# Patient Record
Sex: Male | Born: 1980 | Race: Black or African American | Hispanic: No | Marital: Single | Smoking: Current every day smoker
Health system: Southern US, Community
[De-identification: ages and names within clinical notes are randomized; demographics above are authoritative.]

## PROBLEM LIST (undated history)

## (undated) ENCOUNTER — Emergency Department (HOSPITAL_COMMUNITY): Admission: EM

## (undated) ENCOUNTER — Emergency Department (HOSPITAL_COMMUNITY): Admission: EM | Payer: Medicare (Managed Care) | Source: Home / Self Care

## (undated) ENCOUNTER — Emergency Department (HOSPITAL_COMMUNITY): Admission: EM | Payer: Medicare (Managed Care)

## (undated) ENCOUNTER — Emergency Department (HOSPITAL_BASED_OUTPATIENT_CLINIC_OR_DEPARTMENT_OTHER): Payer: Medicare (Managed Care) | Source: Home / Self Care

## (undated) DIAGNOSIS — E119 Type 2 diabetes mellitus without complications: Secondary | ICD-10-CM

## (undated) DIAGNOSIS — I10 Essential (primary) hypertension: Secondary | ICD-10-CM

## (undated) DIAGNOSIS — F250 Schizoaffective disorder, bipolar type: Secondary | ICD-10-CM

## (undated) DIAGNOSIS — S069X9A Unspecified intracranial injury with loss of consciousness of unspecified duration, initial encounter: Secondary | ICD-10-CM

## (undated) DIAGNOSIS — K219 Gastro-esophageal reflux disease without esophagitis: Secondary | ICD-10-CM

## (undated) DIAGNOSIS — S069XAA Unspecified intracranial injury with loss of consciousness status unknown, initial encounter: Secondary | ICD-10-CM

## (undated) DIAGNOSIS — F1010 Alcohol abuse, uncomplicated: Secondary | ICD-10-CM

## (undated) DIAGNOSIS — Z5900 Homelessness unspecified: Secondary | ICD-10-CM

## (undated) HISTORY — DX: Gastro-esophageal reflux disease without esophagitis: K21.9

## (undated) HISTORY — PX: MOUTH SURGERY: SHX715

---

## 2010-03-17 ENCOUNTER — Inpatient Hospital Stay (HOSPITAL_COMMUNITY): Admission: AD | Admit: 2010-03-17 | Discharge: 2010-03-22 | Payer: Self-pay | Admitting: Psychiatry

## 2010-03-18 ENCOUNTER — Ambulatory Visit: Payer: Self-pay | Admitting: Psychiatry

## 2010-12-25 ENCOUNTER — Emergency Department (HOSPITAL_COMMUNITY)
Admission: EM | Admit: 2010-12-25 | Discharge: 2010-12-25 | Payer: Self-pay | Source: Home / Self Care | Admitting: Emergency Medicine

## 2011-03-25 LAB — CBC
HCT: 41.7 % (ref 39.0–52.0)
MCV: 97 fL (ref 78.0–100.0)
Platelets: 254 10*3/uL (ref 150–400)
RBC: 4.3 MIL/uL (ref 4.22–5.81)
WBC: 6.7 10*3/uL (ref 4.0–10.5)

## 2011-03-25 LAB — COMPREHENSIVE METABOLIC PANEL
AST: 17 U/L (ref 0–37)
Albumin: 4 g/dL (ref 3.5–5.2)
Alkaline Phosphatase: 79 U/L (ref 39–117)
BUN: 9 mg/dL (ref 6–23)
CO2: 27 mEq/L (ref 19–32)
Chloride: 105 mEq/L (ref 96–112)
GFR calc non Af Amer: >60 mL/min (ref >60)
Potassium: 4.3 mEq/L (ref 3.5–5.1)
Total Bilirubin: 0.7 mg/dL (ref 0.3–1.2)

## 2012-02-06 ENCOUNTER — Encounter (HOSPITAL_COMMUNITY): Payer: Self-pay | Admitting: *Deleted

## 2012-02-06 ENCOUNTER — Emergency Department (HOSPITAL_COMMUNITY)
Admission: EM | Admit: 2012-02-06 | Discharge: 2012-02-06 | Disposition: A | Discharge status: Home / Self Care | Payer: Medicare Other | Attending: Emergency Medicine | Admitting: Emergency Medicine

## 2012-02-06 ENCOUNTER — Emergency Department (HOSPITAL_COMMUNITY): Payer: Medicare Other

## 2012-02-06 DIAGNOSIS — S92309A Fracture of unspecified metatarsal bone(s), unspecified foot, initial encounter for closed fracture: Secondary | ICD-10-CM | POA: Insufficient documentation

## 2012-02-06 DIAGNOSIS — M79609 Pain in unspecified limb: Secondary | ICD-10-CM | POA: Insufficient documentation

## 2012-02-06 DIAGNOSIS — IMO0002 Reserved for concepts with insufficient information to code with codable children: Secondary | ICD-10-CM | POA: Insufficient documentation

## 2012-02-06 DIAGNOSIS — S92902A Unspecified fracture of left foot, initial encounter for closed fracture: Secondary | ICD-10-CM

## 2012-02-06 DIAGNOSIS — M7989 Other specified soft tissue disorders: Secondary | ICD-10-CM | POA: Insufficient documentation

## 2012-02-06 DIAGNOSIS — S6991XA Unspecified injury of right wrist, hand and finger(s), initial encounter: Secondary | ICD-10-CM

## 2012-02-06 DIAGNOSIS — S62319A Displaced fracture of base of unspecified metacarpal bone, initial encounter for closed fracture: Secondary | ICD-10-CM | POA: Insufficient documentation

## 2012-02-06 MED ORDER — OXYCODONE-ACETAMINOPHEN 7.5-325 MG PO TABS: 1.0000 | ORAL_TABLET | ORAL | Status: AC | PRN
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
## 2012-02-06 NOTE — Progress Notes (Signed)
Orthopedic Tech Progress Note Patient Details:  Nathan Norton 1981-06-22 [id number]  Other Ortho Devices Type of Ortho Device: Crutches;CAM walker Ortho Device Location: (L) LE Ortho Device Interventions: Application   Jennye Moccasin 02/06/2012, 5:38 PM
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
## 2012-06-11 ENCOUNTER — Emergency Department (HOSPITAL_COMMUNITY): Payer: Medicare Other

## 2012-06-11 ENCOUNTER — Emergency Department (HOSPITAL_COMMUNITY)
Admission: EM | Admit: 2012-06-11 | Discharge: 2012-06-11 | Disposition: A | Discharge status: Home / Self Care | Payer: Medicare Other | Attending: Emergency Medicine | Admitting: Emergency Medicine

## 2012-06-11 ENCOUNTER — Encounter (HOSPITAL_COMMUNITY): Payer: Self-pay | Admitting: *Deleted

## 2012-06-11 DIAGNOSIS — M25511 Pain in right shoulder: Secondary | ICD-10-CM

## 2012-06-11 DIAGNOSIS — M79642 Pain in left hand: Secondary | ICD-10-CM

## 2012-06-11 DIAGNOSIS — F319 Bipolar disorder, unspecified: Secondary | ICD-10-CM | POA: Insufficient documentation

## 2012-06-11 DIAGNOSIS — M25519 Pain in unspecified shoulder: Secondary | ICD-10-CM | POA: Insufficient documentation

## 2012-06-11 DIAGNOSIS — F17200 Nicotine dependence, unspecified, uncomplicated: Secondary | ICD-10-CM | POA: Insufficient documentation

## 2012-06-11 DIAGNOSIS — Z91013 Allergy to seafood: Secondary | ICD-10-CM | POA: Insufficient documentation

## 2012-06-11 DIAGNOSIS — F259 Schizoaffective disorder, unspecified: Secondary | ICD-10-CM | POA: Insufficient documentation

## 2012-06-11 DIAGNOSIS — M79609 Pain in unspecified limb: Secondary | ICD-10-CM | POA: Insufficient documentation

## 2012-06-11 HISTORY — DX: Schizoaffective disorder, bipolar type: F25.0

## 2012-06-11 MED ORDER — IBUPROFEN 200 MG PO TABS
600.0000 mg | ORAL_TABLET | Freq: Once | ORAL | Status: AC
  Administered 2012-06-11: 600 mg via ORAL
  Filled 2012-06-11: qty 3

## 2012-06-11 MED ORDER — IBUPROFEN 600 MG PO TABS: 600.0000 mg | ORAL_TABLET | Freq: Three times a day (TID) | ORAL | Status: AC | PRN

## 2012-06-11 MED ORDER — OXYCODONE-ACETAMINOPHEN 5-325 MG PO TABS
1.0000 | ORAL_TABLET | Freq: Once | ORAL | Status: AC
  Administered 2012-06-11: 1 via ORAL
  Filled 2012-06-11: qty 1

## 2012-06-11 NOTE — ED Notes (Signed)
Pt reports he was wrestling with his brother and has pain to right shoulder, left hand, and left foot. ROM is intact.

## 2012-06-11 NOTE — ED Provider Notes (Signed)
History  Scribed for Nathan Co, MD, the patient was seen in room STRE4/STRE4. This chart was scribed by Candelaria Stagers. The patient's care started at 12:47 PM   CSN: [id number]  Arrival date & time 06/11/12  1227   None     Chief Complaint  Patient presents with  . Shoulder Pain  . Hand Pain    Patient is a 31 y.o. male presenting with hand pain. The history is provided by the patient.  Hand Pain   Nathan Norton is a 31 y.o. male who presents to the Emergency Department complaining of right shoulder, left hand, and left foot pain after horse playing with little brother two days ago.  Pt states that he punched the wall after getting frustrated about right shoulder.  He has h/o surgery to the left foot.  He has taken ibuprofen and tylenol with no relief.    Past Medical History  Diagnosis Date  . Schizoaffective disorder, bipolar type     History reviewed. No pertinent past surgical history.  History reviewed. No pertinent family history.  History  Substance Use Topics  . Smoking status: Current Everyday Smoker    Types: Cigarettes  . Smokeless tobacco: Not on file  . Alcohol Use: Yes      Review of Systems  All other systems reviewed and are negative.    Allergies  Shellfish allergy  Home Medications   Current Outpatient Rx  Name Route Sig Dispense Refill  . IBUPROFEN 200 MG PO TABS Oral Take 200 mg by mouth every 6 (six) hours as needed. For pain.      BP 135/71  Pulse 81  Temp 97.6 F (36.4 C) (Oral)  Resp 18  SpO2 100%  Physical Exam  Nursing note and vitals reviewed. Constitutional: He is oriented to person, place, and time. He appears well-developed and well-nourished. No distress.  HENT:  Head: Normocephalic and atraumatic.  Eyes: EOM are normal.  Neck: Neck supple. No tracheal deviation present.  Cardiovascular: Normal rate.   Pulmonary/Chest: Effort normal. No respiratory distress.  Musculoskeletal: Normal range of motion.   Mild tenderness of the fourth metacarpel.  No c-spine tenderness.  Mild tenderness of the first metatarsal with no deformity.    Neurological: He is alert and oriented to person, place, and time.  Skin: Skin is warm and dry.  Psychiatric: He has a normal mood and affect. His behavior is normal.    ED Course  Procedures  DIAGNOSTIC STUDIES: Oxygen Saturation is 100% on room air, normal by my interpretation.    COORDINATION OF CARE:  12:47PM Ordered: DG Hand Complete Left   Labs Reviewed - No data to display Dg Hand Complete Left  06/11/2012  *RADIOLOGY REPORT*  Clinical Data: Injured wrestling with brother  LEFT HAND - COMPLETE 3+ VIEW  Comparison: None.  Findings: The radiocarpal joint space appears normal.  The carpal bones are in normal position.  Joint spaces appear normal.  No fracture is seen.  IMPRESSION: Negative.  Original Report Authenticated By: Juline Patch, M.D.     1. Left hand pain   2. Right shoulder pain       MDM  The patient is well-appearing.  Given his left hand tenderness and x-ray was obtained which demonstrates no evidence of fracture.  His full range of motion of his bilateral ankles knees and hips.  Full range of motion of his bilateral wrists elbows and shoulders.  There is no indication for additional imaging.  This is  all likely musculoskeletal in nature.  Discharge home in good condition  I personally performed the services described in this documentation, which was scribed in my presence. The recorded information has been reviewed and considered.           Nathan Co, MD 06/11/12 1340

## 2013-04-24 ENCOUNTER — Encounter (HOSPITAL_COMMUNITY): Payer: Self-pay | Admitting: *Deleted

## 2013-04-24 ENCOUNTER — Emergency Department (HOSPITAL_COMMUNITY)
Admission: EM | Admit: 2013-04-24 | Discharge: 2013-04-25 | Disposition: A | Discharge status: Home / Self Care | Payer: Medicare Other | Attending: Emergency Medicine | Admitting: Emergency Medicine

## 2013-04-24 DIAGNOSIS — R4789 Other speech disturbances: Secondary | ICD-10-CM | POA: Insufficient documentation

## 2013-04-24 DIAGNOSIS — Z8659 Personal history of other mental and behavioral disorders: Secondary | ICD-10-CM | POA: Insufficient documentation

## 2013-04-24 DIAGNOSIS — F17200 Nicotine dependence, unspecified, uncomplicated: Secondary | ICD-10-CM | POA: Insufficient documentation

## 2013-04-24 DIAGNOSIS — F1010 Alcohol abuse, uncomplicated: Secondary | ICD-10-CM | POA: Insufficient documentation

## 2013-04-24 DIAGNOSIS — F1210 Cannabis abuse, uncomplicated: Secondary | ICD-10-CM | POA: Insufficient documentation

## 2013-04-24 DIAGNOSIS — F10929 Alcohol use, unspecified with intoxication, unspecified: Secondary | ICD-10-CM

## 2013-04-24 NOTE — ED Notes (Signed)
Pt changed into blue scrubs and belongings placed in bag.  Security wanding pt and belongings

## 2013-04-24 NOTE — ED Notes (Signed)
Pt was brought in by parents,  [employer] followed them here,  Parents are gone to get IVC papers,  Pt is under the influence of some drugs of unknown kind and intoxicated

## 2013-04-25 LAB — COMPREHENSIVE METABOLIC PANEL
Albumin: 4.2 g/dL (ref 3.5–5.2)
BUN: 10 mg/dL (ref 6–23)
Calcium: 9.5 mg/dL (ref 8.4–10.5)
Creatinine, Ser: 1.19 mg/dL (ref 0.50–1.35)
Total Protein: 8.3 g/dL (ref 6.0–8.3)

## 2013-04-25 LAB — RAPID URINE DRUG SCREEN, HOSP PERFORMED
Benzodiazepines: NOT DETECTED
Cocaine: NOT DETECTED
Opiates: NOT DETECTED

## 2013-04-25 LAB — ACETAMINOPHEN LEVEL: Acetaminophen (Tylenol), Serum: <15 ug/mL (ref 10–30)

## 2013-04-25 LAB — CBC
HCT: 43 % (ref 39.0–52.0)
MCHC: 34.7 g/dL (ref 30.0–36.0)
MCV: 99.1 fL (ref 78.0–100.0)
Platelets: 343 10*3/uL (ref 150–400)
RDW: 14.3 % (ref 11.5–15.5)

## 2013-04-25 LAB — SALICYLATE LEVEL: Salicylate Lvl: <2 mg/dL — ABNORMAL LOW (ref 2.8–20.0)

## 2013-04-25 LAB — ETHANOL: Alcohol, Ethyl (B): 342 mg/dL — ABNORMAL HIGH (ref 0–11)

## 2013-04-25 MED ORDER — ONDANSETRON 4 MG PO TBDP
4.0000 mg | ORAL_TABLET | Freq: Once | ORAL | Status: AC
  Administered 2013-04-25: 4 mg via ORAL
  Filled 2013-04-25: qty 1

## 2013-04-25 NOTE — ED Provider Notes (Signed)
History     CSN: [id number]  Arrival date & time 04/24/13  2326   First MD Initiated Contact with Patient 04/24/13 2337      Chief Complaint  Patient presents with  . Medical Clearance    (Consider location/radiation/quality/duration/timing/severity/associated sxs/prior treatment) HPI Comments: Patient brought in to the ED by [employer] after being called by the patient's parents.  Parents are apparently going to fill out IVC paperwork.  The patient has been drinking alcohol daily and parents also feel that he is using recreational drugs.  Patient denies SI or HI.  Patient reports that he does not want help with alcohol detox.   He denies any physical symptoms at this time.  The history is provided by the patient.    Past Medical History  Diagnosis Date  . Schizoaffective disorder, bipolar type     History reviewed. No pertinent past surgical history.  History reviewed. No pertinent family history.  History  Substance Use Topics  . Smoking status: Current Every Day Smoker    Types: Cigarettes  . Smokeless tobacco: Not on file  . Alcohol Use: Yes      Review of Systems  Psychiatric/Behavioral: Negative for suicidal ideas.  All other systems reviewed and are negative.    Allergies  Shellfish allergy  Home Medications   Current Outpatient Rx  Name  Route  Sig  Dispense  Refill  . ibuprofen (ADVIL,MOTRIN) 200 MG tablet   Oral   Take 200 mg by mouth every 6 (six) hours as needed. For pain.           BP 149/84  Pulse 104  Temp(Src) 96.5 F (35.8 C) (Axillary)  Resp 16  SpO2 96%  Physical Exam  Nursing note and vitals reviewed. Constitutional: He is oriented to person, place, and time. He appears well-developed and well-nourished.  HENT:  Head: Normocephalic and atraumatic.  Mouth/Throat: Oropharynx is clear and moist.  Eyes: EOM are normal.  Neck: Normal range of motion. Neck supple.  Cardiovascular: Normal rate, regular rhythm and normal heart sounds.    Pulmonary/Chest: Effort normal and breath sounds normal.  Neurological: He is alert and oriented to person, place, and time.  Skin: Skin is warm and dry.  Psychiatric: He has a normal mood and affect. Thought content normal. His speech is slurred. He expresses no homicidal and no suicidal ideation. He expresses no suicidal plans and no homicidal plans.    ED Course  Procedures (including critical care time)  Labs Reviewed  CBC - Abnormal; Notable for the following:    MCH 34.3 (*)    All other components within normal limits  COMPREHENSIVE METABOLIC PANEL - Abnormal; Notable for the following:    Glucose, Bld 106 (*)    AST 47 (*)    GFR calc non Af Amer 80 (*)    All other components within normal limits  ETHANOL - Abnormal; Notable for the following:    Alcohol, Ethyl (B) 342 (*)    All other components within normal limits  SALICYLATE LEVEL - Abnormal; Notable for the following:    Salicylate Lvl <2.0 (*)    All other components within normal limits  URINE RAPID DRUG SCREEN (HOSP PERFORMED) - Abnormal; Notable for the following:    Tetrahydrocannabinol POSITIVE (*)    All other components within normal limits  ACETAMINOPHEN LEVEL   No results found.   No diagnosis found.  Patient discussed with Dr. Dierdre Highman.  MDM  Patient brought in by [employer].  Parents  attempted to get IVC paperwork, but apparently is was declined by magistrate.  Patient denies SI or HI.  He is intoxicated, but states that he does not want help with alcohol detox.  Alcohol level 342, but patient is alert and ambulatory.   Patient discharged in the care of the [employer].        Pascal Lux [place], PA-C 04/26/13 1217

## 2013-04-26 NOTE — ED Provider Notes (Signed)
Medical screening examination/treatment/procedure(s) were performed by non-physician practitioner and as supervising physician I was immediately available for consultation/collaboration.  Sunnie Nielsen, MD 04/26/13 [phone]

## 2013-05-21 ENCOUNTER — Emergency Department (HOSPITAL_COMMUNITY)
Admission: EM | Admit: 2013-05-21 | Discharge: 2013-05-22 | Disposition: A | Discharge status: Other Acute Inpatient Hospital | Payer: Medicare Other | Attending: Emergency Medicine | Admitting: Emergency Medicine

## 2013-05-21 ENCOUNTER — Encounter (HOSPITAL_COMMUNITY): Payer: Self-pay | Admitting: *Deleted

## 2013-05-21 DIAGNOSIS — R45851 Suicidal ideations: Secondary | ICD-10-CM | POA: Insufficient documentation

## 2013-05-21 DIAGNOSIS — R45850 Homicidal ideations: Secondary | ICD-10-CM | POA: Insufficient documentation

## 2013-05-21 DIAGNOSIS — F17200 Nicotine dependence, unspecified, uncomplicated: Secondary | ICD-10-CM | POA: Insufficient documentation

## 2013-05-21 DIAGNOSIS — F1910 Other psychoactive substance abuse, uncomplicated: Secondary | ICD-10-CM | POA: Insufficient documentation

## 2013-05-21 DIAGNOSIS — F411 Generalized anxiety disorder: Secondary | ICD-10-CM | POA: Insufficient documentation

## 2013-05-21 DIAGNOSIS — F259 Schizoaffective disorder, unspecified: Secondary | ICD-10-CM | POA: Diagnosis present

## 2013-05-21 DIAGNOSIS — Z79899 Other long term (current) drug therapy: Secondary | ICD-10-CM | POA: Insufficient documentation

## 2013-05-21 DIAGNOSIS — Z91199 Patient's noncompliance with other medical treatment and regimen due to unspecified reason: Secondary | ICD-10-CM | POA: Insufficient documentation

## 2013-05-21 DIAGNOSIS — Z9119 Patient's noncompliance with other medical treatment and regimen: Secondary | ICD-10-CM | POA: Insufficient documentation

## 2013-05-21 DIAGNOSIS — R21 Rash and other nonspecific skin eruption: Secondary | ICD-10-CM | POA: Insufficient documentation

## 2013-05-21 LAB — COMPREHENSIVE METABOLIC PANEL
ALT: 30 U/L (ref 0–53)
AST: 56 U/L — ABNORMAL HIGH (ref 0–37)
CO2: 25 mEq/L (ref 19–32)
Chloride: 98 mEq/L (ref 96–112)
Glucose, Bld: 83 mg/dL (ref 70–99)
Potassium: 3.9 mEq/L (ref 3.5–5.1)
Sodium: 136 mEq/L (ref 135–145)

## 2013-05-21 LAB — SALICYLATE LEVEL: Salicylate Lvl: <2 mg/dL — ABNORMAL LOW (ref 2.8–20.0)

## 2013-05-21 LAB — RAPID URINE DRUG SCREEN, HOSP PERFORMED
Benzodiazepines: NOT DETECTED
Cocaine: NOT DETECTED

## 2013-05-21 LAB — CBC
Hemoglobin: 13.5 g/dL (ref 13.0–17.0)
Platelets: 480 10*3/uL — ABNORMAL HIGH (ref 150–400)
RBC: 3.92 MIL/uL — ABNORMAL LOW (ref 4.22–5.81)
WBC: 7.5 10*3/uL (ref 4.0–10.5)

## 2013-05-21 LAB — ETHANOL: Alcohol, Ethyl (B): 160 mg/dL — ABNORMAL HIGH (ref 0–11)

## 2013-05-21 LAB — ACETAMINOPHEN LEVEL: Acetaminophen (Tylenol), Serum: <15 ug/mL (ref 10–30)

## 2013-05-21 MED ORDER — ZOLPIDEM TARTRATE 5 MG PO TABS: 5.0000 mg | ORAL_TABLET | Freq: Every evening | ORAL | Status: DC | PRN

## 2013-05-21 MED ORDER — LORAZEPAM 1 MG PO TABS
1.0000 mg | ORAL_TABLET | Freq: Three times a day (TID) | ORAL | Status: DC | PRN
  Administered 2013-05-22: 1 mg via ORAL
  Filled 2013-05-21: qty 1

## 2013-05-21 MED ORDER — ACETAMINOPHEN 325 MG PO TABS
650.0000 mg | ORAL_TABLET | ORAL | Status: DC | PRN
  Administered 2013-05-22: 650 mg via ORAL
  Filled 2013-05-21: qty 2

## 2013-05-21 MED ORDER — IBUPROFEN 600 MG PO TABS: 600.0000 mg | ORAL_TABLET | Freq: Three times a day (TID) | ORAL | Status: DC | PRN

## 2013-05-21 MED ORDER — NICOTINE 21 MG/24HR TD PT24
21.0000 mg | MEDICATED_PATCH | Freq: Every day | TRANSDERMAL | Status: DC
  Administered 2013-05-22: 21 mg via TRANSDERMAL
  Filled 2013-05-21: qty 1

## 2013-05-21 MED ORDER — ALUM & MAG HYDROXIDE-SIMETH 200-200-20 MG/5ML PO SUSP: 30.0000 mL | ORAL | Status: DC | PRN

## 2013-05-21 MED ORDER — ONDANSETRON HCL 4 MG PO TABS: 4.0000 mg | ORAL_TABLET | Freq: Three times a day (TID) | ORAL | Status: DC | PRN
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
## 2013-05-22 ENCOUNTER — Inpatient Hospital Stay (HOSPITAL_COMMUNITY)
Admission: AD | Admit: 2013-05-22 | Discharge: 2013-05-26 | DRG: 885 | Disposition: A | Discharge status: Home / Self Care | Payer: Medicare Other | Source: Ambulatory Visit | Attending: Psychiatry | Admitting: Psychiatry

## 2013-05-22 ENCOUNTER — Encounter (HOSPITAL_COMMUNITY): Payer: Self-pay | Admitting: *Deleted

## 2013-05-22 DIAGNOSIS — F259 Schizoaffective disorder, unspecified: Principal | ICD-10-CM | POA: Diagnosis present

## 2013-05-22 DIAGNOSIS — Z79899 Other long term (current) drug therapy: Secondary | ICD-10-CM

## 2013-05-22 DIAGNOSIS — Z9119 Patient's noncompliance with other medical treatment and regimen: Secondary | ICD-10-CM

## 2013-05-22 DIAGNOSIS — Z91199 Patient's noncompliance with other medical treatment and regimen due to unspecified reason: Secondary | ICD-10-CM

## 2013-05-22 DIAGNOSIS — F1210 Cannabis abuse, uncomplicated: Secondary | ICD-10-CM | POA: Diagnosis present

## 2013-05-22 DIAGNOSIS — F1010 Alcohol abuse, uncomplicated: Secondary | ICD-10-CM | POA: Diagnosis present

## 2013-05-22 MED ORDER — NICOTINE 21 MG/24HR TD PT24
21.0000 mg | MEDICATED_PATCH | Freq: Every day | TRANSDERMAL | Status: DC
  Administered 2013-05-23 – 2013-05-26 (×4): 21 mg via TRANSDERMAL
  Filled 2013-05-22 (×6): qty 1

## 2013-05-22 MED ORDER — LORAZEPAM 1 MG PO TABS
1.0000 mg | ORAL_TABLET | Freq: Three times a day (TID) | ORAL | Status: DC | PRN
  Administered 2013-05-23: 1 mg via ORAL
  Filled 2013-05-22 (×2): qty 1

## 2013-05-22 MED ORDER — ACETAMINOPHEN 325 MG PO TABS
650.0000 mg | ORAL_TABLET | Freq: Four times a day (QID) | ORAL | Status: DC | PRN
  Administered 2013-05-22 – 2013-05-25 (×4): 650 mg via ORAL

## 2013-05-22 MED ORDER — ALUM & MAG HYDROXIDE-SIMETH 200-200-20 MG/5ML PO SUSP: 30.0000 mL | ORAL | Status: DC | PRN

## 2013-05-22 MED ORDER — ARIPIPRAZOLE 10 MG PO TABS
20.0000 mg | ORAL_TABLET | Freq: Once | ORAL | Status: AC
  Administered 2013-05-22: 20 mg via ORAL
  Filled 2013-05-22: qty 2

## 2013-05-22 MED ORDER — BENZTROPINE MESYLATE 1 MG PO TABS
1.0000 mg | ORAL_TABLET | Freq: Every day | ORAL | Status: DC
  Administered 2013-05-22 – 2013-05-25 (×4): 1 mg via ORAL
  Filled 2013-05-22 (×6): qty 1

## 2013-05-22 MED ORDER — MAGNESIUM HYDROXIDE 400 MG/5ML PO SUSP: 30.0000 mL | Freq: Every day | ORAL | Status: DC | PRN

## 2013-05-22 NOTE — ED Notes (Signed)
[employer] contacted for transport to [facility] 

## 2013-05-22 NOTE — Progress Notes (Signed)
Patient ID: Cambell Stanek, male   DOB: Dec 07, 1981, 32 y.o.   MRN: [id number]  D: Pt denies SI/HI/AVH. Pt is pleasant and cooperative. Pt slept most of the night. Pt did get up to go to group and get a snack. Pt states he may need something to help him sleep. Pt states he's ready to get back to his two kids.   A: Pt was offered support and encouragement. Pt was given scheduled medications. Pt was encourage to attend groups. Q 15 minute checks were done for safety.   R:Pt attends groups and interacts well with peers and staff. Pt is taking medication.Pt receptive to treatment and safety maintained on unit.

## 2013-05-22 NOTE — ED Notes (Signed)
NP in w/ patient 

## 2013-05-22 NOTE — BH Assessment (Signed)
[facility] Assessment Progress Note  Shuvon Rankin, FNP reports that she has examined pt at [facility] and agrees to accept him to [facility].  Jacquelyne Balint, RN, Neurosurgeon assigns pt to the service of Thedore Mins, MD, Rm 400-2.  At 09:47 I spoke to Scherrie Merritts, LCSW, Assessment Counselor to notify him.  Doylene Canning, MA Assessment Counselor 05/22/2013 @ 09:51

## 2013-05-22 NOTE — ED Notes (Signed)
telepch info called and faxed

## 2013-05-22 NOTE — ED Provider Notes (Signed)
Pt stable awaiting placement  Nathan Lennert, MD 05/22/13 [phone]

## 2013-05-22 NOTE — ED Notes (Signed)
Pt ambulatory to [facility] w/ [employer]-belongings sent w/ pt.

## 2013-05-22 NOTE — Progress Notes (Signed)
The focus of this group is to help patients review their daily goal of treatment and discuss progress on daily workbooks. Pt attended the evening group session and responded to all discussion prompts from the Writer. Pt reported having a good day in which he was able to catch up on his rest. Pt stated he felt like he was finally back on the "right medications" to help him, ones that had worked for him in the past. Pt spoke of his Mother as a major source of support and his goal to find a stable place to live in the coming week. Pt's affect was appropriate and he appeared engaged in the group.

## 2013-05-22 NOTE — ED Notes (Signed)
Snack given.
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
## 2013-05-22 NOTE — Progress Notes (Signed)
Patient ID: Nathan Norton, male   DOB: 19-Mar-1981, 32 y.o.   MRN: [id number] 05-22-13 @ 1300 nursing admission note: pt is an [facility]. He is follow by an ACT team person but couldn't give any details.  He has been in this facility before he stated about 1 year ago. He has been non compliant with his home medications and having auditory hallucination, as well as command hallucinations. He stated these command hallucinations are more directional in nature, for example "turn on the TV". He thinking was disorganized and he was rather ambivalent during the admission process. He denied any si/hi on admission. He complained of back pain and had no other medical problems. He was given tylenol for his back pain. He is allergic to shellfish. He did have a rash on his penis and shoulder that he was concerned about, but didn't complain of itching. He stated he drank about one 40 oz beer on a daily basis and had a positive uds for mj. He was reported he got a shower at the [facility]. His labs are aceta <15, cbc wnl except rbc=3.92 low, plat 480 H, mch 34.4 H; cmet wnl except some elevated liver enzymes; etoh 160 high; salicylate <2.0 low; all done 1 day ago.  He was escorted to the 400 hall and shown the unit. He was polite/cooperative on admission.

## 2013-05-22 NOTE — Tx Team (Signed)
Initial Interdisciplinary Treatment Plan  PATIENT STRENGTHS: (choose at least two) Motivation for treatment/growth Supportive family/friends  PATIENT STRESSORS: Marital or family conflict Medication change or noncompliance Occupational concerns Substance abuse   PROBLEM LIST: Problem List/Patient Goals Date to be addressed Date deferred Reason deferred Estimated date of resolution  Schizoaffective d/o bipolar type  05-22-13           Non compliance  05-22-13           Substance abuse/dependence  05-22-13                              DISCHARGE CRITERIA:  Improved stabilization in mood, thinking, and/or behavior Verbal commitment to aftercare and medication compliance  PRELIMINARY DISCHARGE PLAN: Attend aftercare/continuing care group Return to previous living arrangement  PATIENT/FAMIILY INVOLVEMENT: This treatment plan has been presented to and reviewed with the patient, Nathan Norton, and/or family member, .  The patient and family have been given the opportunity to ask questions and make suggestions.  Nathan Norton 05/22/2013, 1:12 PM

## 2013-05-22 NOTE — Progress Notes (Signed)
1st opinion complete by Dr. Estell Harpin.  Admit paper work faxed to [facility].

## 2013-05-22 NOTE — ED Notes (Signed)
telepsych cancelled Nathan Melnick NP
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
## 2013-05-23 DIAGNOSIS — F200 Paranoid schizophrenia: Secondary | ICD-10-CM

## 2013-05-23 NOTE — Progress Notes (Signed)
Patient ID: Nathan Norton, male   DOB: 09-24-1981, 32 y.o.   MRN: [id number] Psychoeducational Group Note  Date:  05/23/2013 Time:  1000am  Group Topic/Focus:  Making Healthy Choices:   The focus of this group is to help patients identify negative/unhealthy choices they were using prior to admission and identify positive/healthier coping strategies to replace them upon discharge.  Participation Level:  Minimal  Participation Quality:  Attentive  Affect:  Flat  Cognitive:  Disorganized  Insight:  Limited  Engagement in Group:  Limited  Additional Comments:  Psychoeducational group   Valente David 05/23/2013,10:39 AM

## 2013-05-23 NOTE — [facility] Group Notes (Signed)
[facility] Group Notes:  (Clinical Social Work)  05/23/2013   11:15-11:45AM  Summary of Progress/Problems:  The main focus of today's process group was to listen to a variety of genres of music and to identify that different types of music provoke different responses.  The patient then was able to identify personally what was soothing for them, as well as energizing.  Handouts were used to record feelings evoked, as well as how patient can personally use this knowledge in sleep habits, with depression, and with other symptoms.  The patient expressed significant understanding of what music would be helpful to him.  Type of Therapy:  Music Therapy   Participation Level:  Active  Participation Quality:  Appropriate, Attentive, Sharing and Supportive  Affect:  Blunted  Cognitive:  Appropriate and Oriented  Insight:  Developing/Improving  Engagement in Therapy:  Engaged  Modes of Intervention:   Activity, Exploration  Ambrose Mantle, LCSW 05/23/2013, 12:33 PM
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
## 2013-05-23 NOTE — Progress Notes (Signed)
Spoke with pt 1:1 tonight. Pt's primary focus is on R elbow pain which he rates a 7/10. Reports injuring it prior to admit when "lifting some wood under a table." Pt is somewhat anxious with brief eye contact. Behavior is appropriate. He states he is not hearing voices though appears to be experiencing some internal stimuli. Denies VH/SI/HI. Pt supported, encouraged to speak with provider tomorrow regarding elbow. Some swelling noted. Ice pack and tylenol provided with minimal relief. Pt safe. Lawrence Marseilles

## 2013-05-23 NOTE — Progress Notes (Signed)
Patient ID: Nathan Norton, male   DOB: 04-Aug-1981, 32 y.o.   MRN: [id number] 05-23-13@ 1130 nursing shift note: D: pt has been visible in the milieu today. He has voice no complaints and has no had any pain. A: staff has supported and encouraged this pt. R: he denied any si/hi/av. On his inventory sheet he wrote: slept well, appetite improving, energy normal, attention improving with his depression and hopelessness both at 1. In the last 24 hrs he said he had some lightheadedness. At discharge he plans to "keep taking his medications and focus on living and a part-time job. RN will continue to monitor and Q 15 min ck's continue.
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
## 2013-05-24 MED ORDER — ARIPIPRAZOLE 10 MG PO TABS
20.0000 mg | ORAL_TABLET | Freq: Every day | ORAL | Status: DC
  Administered 2013-05-24 – 2013-05-26 (×3): 20 mg via ORAL
  Filled 2013-05-24 (×6): qty 2

## 2013-05-24 MED ORDER — NAPROXEN 500 MG PO TABS
250.0000 mg | ORAL_TABLET | Freq: Three times a day (TID) | ORAL | Status: DC
  Administered 2013-05-24 – 2013-05-26 (×7): 250 mg via ORAL
  Filled 2013-05-24 (×13): qty 1

## 2013-05-24 MED ORDER — NAPROXEN SODIUM 275 MG PO TABS: 275.0000 mg | ORAL_TABLET | Freq: Three times a day (TID) | ORAL | Status: DC

## 2013-05-24 MED ORDER — OLOPATADINE HCL 0.1 % OP SOLN
1.0000 [drp] | Freq: Two times a day (BID) | OPHTHALMIC | Status: DC
  Administered 2013-05-24 – 2013-05-26 (×4): 1 [drp] via OPHTHALMIC
  Filled 2013-05-24 (×2): qty 5
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
## 2013-05-24 NOTE — Progress Notes (Signed)
Recreation Therapy Notes  Date: 05.26.2014        Time: 9:30am Location: [address] Dayroom      Group Topic/Focus: Self Expression  Participation Level: Active  Participation Quality: Appropriate  Affect: Euthymic  Cognitive: Oriented   Additional Comments: Activity: Me in 3; Explanation: Patients were asked to identify three characteristics they think represent them and asked to represent these characteristics in words or pictures. Patients were given the option of choosing a genre of music to listen to while completing the task requested. Patients collectively chose R&B music to listen to.   Patient actively participated in group activity. Patient took time and attention to detail to decorate his paper before he wrote his three traits. Patient eventually wrote "I am Music" "I like to eat" and "I am romance" on his paper. Patient stated with a bright smile on his face he is a romantic and is in love.   Marykay Lex Gage Weant, LRT/CTRS  Jearl Klinefelter 05/24/2013 2:33 PM

## 2013-05-24 NOTE — Progress Notes (Signed)
[facility] MD Progress Note  05/24/2013 9:05 AM Jantz Main  MRN:  [id number] Subjective:  "I'm doing well, I'm all right." "my Right elbow is a little sore."  Objective: Patient is pleasant reports he slept well, meds are helping, and the voices are decreasing. Diagnosis: Schizoaffective disorder  ADL's:  Intact  Sleep: Good  Appetite:  Good  Suicidal Ideation:  denies Homicidal Ideation:  denies AEB (as evidenced by): patient reports of decreasing symptoms, improved mood, sleep and appetite.  Psychiatric Specialty Exam: Review of Systems  Constitutional: Negative.  Negative for fever, chills, weight loss, malaise/fatigue and diaphoresis.  HENT: Negative for congestion and sore throat.   Eyes: Negative for blurred vision, double vision and photophobia.  Respiratory: Negative for cough, shortness of breath and wheezing.   Cardiovascular: Negative for chest pain, palpitations and PND.  Gastrointestinal: Negative for heartburn, nausea, vomiting, abdominal pain, diarrhea and constipation.  Musculoskeletal: Positive for joint pain (right elbow). Negative for myalgias and falls.  Neurological: Negative for dizziness, tingling, tremors, sensory change, speech change, focal weakness, seizures, loss of consciousness, weakness and headaches.  Endo/Heme/Allergies: Negative for polydipsia. Does not bruise/bleed easily.  Psychiatric/Behavioral: Negative for depression, suicidal ideas, hallucinations, memory loss and substance abuse. The patient is not nervous/anxious and does not have insomnia.     Blood pressure 117/82, pulse 101, temperature 98.5 F (36.9 C), temperature source Oral, resp. rate 16, height 6\' 2"  (1.88 m), weight 76.204 kg (168 lb), SpO2 99.00%.Body mass index is 21.56 kg/(m^2).  General Appearance: Disheveled  Eye Contact::  Good  Speech:  Normal Rate  Volume:  Normal  Mood:  Anxious and Depressed  Affect:  Congruent  Thought Process:  Goal Directed   Orientation:  Full  (Time, Place, and Person)  Thought Content:  Hallucinations: Auditory  Suicidal Thoughts:  No  Homicidal Thoughts:  No  Memory:  Immediate;   Fair  Judgement:  Impaired  Insight:  Lacking  Psychomotor Activity:  Normal  Concentration:  Fair  Recall:  Poor  Akathisia:  No  Handed:  Right  AIMS (if indicated):     Assets:  Communication Skills Desire for Improvement Physical Health  Sleep:  Number of Hours: 6.75   Current Medications: Current Facility-Administered Medications  Medication Dose Route Frequency Provider Last Rate Last Dose  . acetaminophen (TYLENOL) tablet 650 mg  650 mg Oral Q6H PRN Shuvon Rankin, NP   650 mg at 05/23/13 2107  . alum & mag hydroxide-simeth (MAALOX/MYLANTA) 200-200-20 MG/5ML suspension 30 mL  30 mL Oral Q4H PRN Shuvon Rankin, NP      . ARIPiprazole (ABILIFY) tablet 20 mg  20 mg Oral Daily Verne Spurr, PA-C      . benztropine (COGENTIN) tablet 1 mg  1 mg Oral QHS Shuvon Rankin, NP   1 mg at 05/23/13 2107  . LORazepam (ATIVAN) tablet 1 mg  1 mg Oral Q8H PRN Shuvon Rankin, NP   1 mg at 05/23/13 0736  . magnesium hydroxide (MILK OF MAGNESIA) suspension 30 mL  30 mL Oral Daily PRN Shuvon Rankin, NP      . nicotine (NICODERM CQ - dosed in mg/24 hours) patch 21 mg  21 mg Transdermal Daily Shuvon Rankin, NP   21 mg at 05/24/13 7829    Lab Results:  Results for orders placed during the hospital encounter of 05/21/13 (from the past 48 hour(s))  GC/CHLAMYDIA PROBE AMP     Status: None   Collection Time    05/22/13  9:34 AM  Result Value Range   CT Probe RNA NEGATIVE  NEGATIVE   GC Probe RNA NEGATIVE  NEGATIVE   Comment: (NOTE)                                                                                              Normal Reference Range: Negative          Assay performed using the Gen-Probe APTIMA COMBO2 (R) Assay.     Acceptable specimen types for this assay include APTIMA Swabs (Unisex,     endocervical, urethral, or vaginal), first void urine,  and ThinPrep     liquid based cytology samples.    Physical Findings: AIMS: Facial and Oral Movements Muscles of Facial Expression: None, normal Lips and Perioral Area: None, normal Jaw: None, normal,Extremity Movements Upper (arms, wrists, hands, fingers): None, normal Lower (legs, knees, ankles, toes): None, normal, Trunk Movements Neck, shoulders, hips: None, normal, Overall Severity Severity of abnormal movements (highest score from questions above): None, normal Incapacitation due to abnormal movements: None, normal Patient's awareness of abnormal movements (rate only patient's report): No Awareness, Dental Status Current problems with teeth and/or dentures?: Yes (caries) Does patient usually wear dentures?: No  CIWA:  CIWA-Ar Total: 2 COWS:     Treatment Plan Summary: Daily contact with patient to assess and evaluate symptoms and progress in treatment Medication management  Plan: 1. Continue crisis management and stabilization. 2. Medication management to reduce current symptoms to base line and improve patient's overall level of functioning 3. Treat health problems as indicated. 4. Develop treatment plan to decrease risk of relapse upon discharge and the need for readmission. 5. Psycho-social education regarding relapse prevention and self care. 6. Health care follow up as needed for medical problems. 7. Continue home medications where appropriate. 8. Continue Abilify 20mg  po qd. 9. Will add icepack for elbow and NSAID. 10. ELOS: 1-2 days.   Medical Decision Making Problem Points:  Established problem, stable/improving (1), New problem, with no additional work-up planned (3) and Review of psycho-social stressors (1) Data Points:  Review or order clinical lab tests (1) Review of new medications or change in dosage (2)  I certify that inpatient services furnished can reasonably be expected to improve the patient's condition.  Rona Ravens. Thinh Cuccaro RPAC 10:33  AM 05/24/2013

## 2013-05-24 NOTE — Progress Notes (Signed)
Patient ID: Nathan Norton, male   DOB: Dec 27, 1981, 32 y.o.   MRN: [id number] D: Pt. Visible on the unit, in hall and in dayroom. "my mom just want me to find my own place" Pt. Reports problems with eyes itching, "allergies". Pt. Denies AVH, SHI. A: Writer introduced self to client and encouraged he speak to CM about options for placement. Staff will monitor q28min for safety. Staff encouraged group. R: Pt. Is safe on the unit. Pt. Attended group.

## 2013-05-24 NOTE — Progress Notes (Signed)
Patient ID: Nathan Norton, male   DOB: 1981-02-27, 32 y.o.   MRN: [id number] D- Patient reports fair sleep and good appetite.  His energy level is high and his ability to pay attention is improving.  He is rating his depression a 1 and he denies thoughts of self harm.  He denies hearing voices.  He has been attending group and staying in the dayroom interacting with others.  He is c/o pain in his R elbow and is using an ice pack and took naprosyn for relief.  A- Supported patient.  R- Patient denies any thoughts of hurting himself or his family.

## 2013-05-25 MED ORDER — ARIPIPRAZOLE 10 MG PO TABS: ORAL_TABLET | ORAL | Status: DC

## 2013-05-25 MED ORDER — BENZOCAINE 10 % MT GEL: Freq: Four times a day (QID) | OROMUCOSAL | Status: DC | PRN

## 2013-05-25 MED ORDER — BENZTROPINE MESYLATE 1 MG PO TABS
1.0000 mg | ORAL_TABLET | Freq: Every day | ORAL | Status: DC
  Filled 2013-05-25: qty 3

## 2013-05-25 MED ORDER — BENZOCAINE 10 % MT GEL
Freq: Four times a day (QID) | OROMUCOSAL | Status: DC | PRN
  Administered 2013-05-26: 06:00:00 via OROMUCOSAL
  Filled 2013-05-25: qty 9.4

## 2013-05-25 MED ORDER — BENZTROPINE MESYLATE 1 MG PO TABS: 1.0000 mg | ORAL_TABLET | Freq: Every day | ORAL | Status: DC

## 2013-05-25 MED ORDER — ARIPIPRAZOLE 10 MG PO TABS
10.0000 mg | ORAL_TABLET | Freq: Two times a day (BID) | ORAL | Status: DC
  Filled 2013-05-25 (×2): qty 6

## 2013-05-25 MED ORDER — ARIPIPRAZOLE 10 MG PO TABS
20.0000 mg | ORAL_TABLET | Freq: Every day | ORAL | Status: DC
  Filled 2013-05-25 (×2): qty 6

## 2013-05-25 NOTE — Discharge Summary (Signed)
Physician Discharge Summary Note  Patient:  Nathan Norton is an 32 y.o., male MRN:  [id number] DOB:  22-Mar-1981 Patient phone:  [phone] (home)  Patient address:   [address] [place] [address],   Date of Admission:  05/22/2013 Date of Discharge: 05/25/2013  Reason for Admission: schizoaffective disorder  Discharge Diagnoses: Active Problems:   * No active hospital problems. *  Review of Systems  Constitutional: Negative.  Negative for fever, chills, weight loss, malaise/fatigue and diaphoresis.  HENT: Negative for congestion and sore throat.   Eyes: Negative for blurred vision, double vision and photophobia.  Respiratory: Negative for cough, shortness of breath and wheezing.   Cardiovascular: Negative for chest pain, palpitations and PND.  Gastrointestinal: Negative for heartburn, nausea, vomiting, abdominal pain, diarrhea and constipation.  Musculoskeletal: Negative for myalgias, joint pain and falls.  Neurological: Negative for dizziness, tingling, tremors, sensory change, speech change, focal weakness, seizures, loss of consciousness, weakness and headaches.  Endo/Heme/Allergies: Negative for polydipsia. Does not bruise/bleed easily.  Psychiatric/Behavioral: Negative for depression, suicidal ideas, hallucinations, memory loss and substance abuse. The patient is not nervous/anxious and does not have insomnia.   Discharge Diagnoses:  AXIS I: Schizoaffective disorder-bipolar disorder  Cannabis use disorder. Alcohol abuse  AXIS II: Deferred  AXIS III:  Past Medical History   Diagnosis  Date   AXIS IV: other psychosocial or environmental problems and problems related to social environment  AXIS V: 61-70 mild symptoms  Level of Care:  Hospital Course:  Rigby was brought in to the ED by local [employer] due to being on a bridge and threatening to jump. He has a long history of mental illness and has been non-compliant with medication for the past 3 months.  He was evaluated  and found to be psychotic and was given medical clearance.  His labs were unremarkable with the exception of his UDS which was positive for THC and his BAL was 160.   He was transferred to [facility] for further stabilization and treatment.     Upon arrival on the unit his symptoms were fatigue, difficulty concentrating, recurrent thoughts of death, auditory hallucinations, command hallucinations telling him to kill himself and his mother, impulsivity, irritable mood, and paranoia.      He was started on Abilify which he stated had worked in the past as he was not interested in trying any other medication. For prevention of EPS he was started on Cogentin.  Paris was encouraged to participate in unit programming and to attend groups available to him on the unit.  He was not a behavioral problem on the unit and was cooperative with the staff and respectful of other patients.  He did not need 1:1 observation and was not a security risk.  Zarin responded well to medication and reported quick resolution of his symptoms.       On the third day of admission he felt well enough to be discharged home.  He was in much improved condition than upon arrival and all agreed he was stable to be discharged home with the plan to follow up as noted below. Consults:  None  Significant Diagnostic Studies:  labs: CBC, CMP, UDS, UA  Discharge Vitals:   Blood pressure 120/83, pulse 96, temperature 97.8 F (36.6 C), temperature source Oral, resp. rate 20, height 6\' 2"  (1.88 m), weight 76.204 kg (168 lb), SpO2 99.00%. Body mass index is 21.56 kg/(m^2). Lab Results:   No results found for this or any previous visit (from the past 72  hour(s)).  Physical Findings: AIMS: Facial and Oral Movements Muscles of Facial Expression: None, normal Lips and Perioral Area: None, normal Jaw: None, normal Tongue: None, normal,Extremity Movements Upper (arms, wrists, hands, fingers): None, normal Lower (legs, knees, ankles, toes): None,  normal, Trunk Movements Neck, shoulders, hips: None, normal, Overall Severity Severity of abnormal movements (highest score from questions above): None, normal Incapacitation due to abnormal movements: None, normal Patient's awareness of abnormal movements (rate only patient's report): No Awareness, Dental Status Current problems with teeth and/or dentures?:  (toothache last night) Does patient usually wear dentures?: No  CIWA:  CIWA-Ar Total: 2 COWS:     Psychiatric Specialty Exam: See Psychiatric Specialty Exam and Suicide Risk Assessment completed by Attending Physician prior to discharge.  Discharge destination:  Home  Is patient on multiple antipsychotic therapies at discharge:  No   Has Patient had three or more failed trials of antipsychotic monotherapy by history:  No  Recommended Plan for Multiple Antipsychotic Therapies: Not applicable  Discharge Orders   Future Orders Complete By Expires     Diet - low sodium heart healthy  As directed     Discharge instructions  As directed     Comments:      Take all of your medications as directed. Be sure to keep all of your follow up appointments.  If you are unable to keep your follow up appointment, call your Doctor's office to let them know, and reschedule.  Make sure that you have enough medication to last until your appointment. Be sure to get plenty of rest. Going to bed at the same time each night will help. Try to avoid sleeping during the day.  Increase your activity as tolerated. Regular exercise will help you to sleep better and improve your mental health. Eating a heart healthy diet is recommended. Try to avoid salty or fried foods. Be sure to avoid all alcohol and illegal drugs.    Increase activity slowly  As directed         Medication List    STOP taking these medications       clonazePAM 2 MG tablet  Commonly known as:  KLONOPIN     ibuprofen 200 MG tablet  Commonly known as:  ADVIL,MOTRIN      TAKE these  medications     Indication   ARIPiprazole 10 MG tablet  Commonly known as:  ABILIFY  Take one table (10mg ) twice a day for schizophrenia.   Indication:  Schizophrenia     benzocaine 10 % mucosal gel  Commonly known as:  ORAJEL  Use as directed in the mouth or throat 4 (four) times daily as needed for pain. Tooth ache.   Indication:  Toothache     benztropine 1 MG tablet  Commonly known as:  COGENTIN  Take 1 tablet (1 mg total) by mouth at bedtime. For prevention of side effects.   Indication:  Extrapyramidal Reaction caused by Medications, extrapyramidal sx         Follow-up recommendations:   Activities: Resume activity as tolerated. Diet: Heart healthy low sodium diet Tests: Follow up testing will be determined by your out patient provider. Comments:    Total Discharge Time:  Greater than 30 minutes.  Signed: Susy Placzek 05/25/2013, 11:46 AM

## 2013-05-25 NOTE — Progress Notes (Signed)
Patient ID: Nathan Norton, male   DOB: [birth year], 32 y.o.   MRN: [id number] [facility] MD Progress Note  05/25/2013 11:37 AM Nathan Norton  MRN:  [id number] Subjective:  "I'm doing pretty good." "I've got tooth ache in both wisdom teeth and didn't sleep well last night."  Objective: Patient is pleasant reports he is feeling ok, but poor sleep due to dental issues. Some depression is noted, but does not want to change his medications at this time. Had a lengthy discussion regarding his medication and he is resistant to taking anything new for depression. Diagnosis: Schizoaffective disorder  ADL's:  Intact  Sleep: poor  Appetite:  Good  Suicidal Ideation:  denies Homicidal Ideation:  denies AEB (as evidenced by): patient reports of decreasing symptoms, improved mood, sleep and appetite.  Psychiatric Specialty Exam: Review of Systems  Constitutional: Negative.  Negative for fever, chills, weight loss, malaise/fatigue and diaphoresis.  HENT: Negative for congestion and sore throat.   Eyes: Negative for blurred vision, double vision and photophobia.  Respiratory: Negative for cough, shortness of breath and wheezing.   Cardiovascular: Negative for chest pain, palpitations and PND.  Gastrointestinal: Negative for heartburn, nausea, vomiting, abdominal pain, diarrhea and constipation.  Musculoskeletal: Positive for joint pain (right elbow). Negative for myalgias and falls.  Neurological: Negative for dizziness, tingling, tremors, sensory change, speech change, focal weakness, seizures, loss of consciousness, weakness and headaches.  Endo/Heme/Allergies: Negative for polydipsia. Does not bruise/bleed easily.  Psychiatric/Behavioral: Negative for depression, suicidal ideas, hallucinations, memory loss and substance abuse. The patient is not nervous/anxious and does not have insomnia.     Blood pressure 120/83, pulse 96, temperature 97.8 F (36.6 C), temperature source Oral, resp. rate 20, height  6\' 2"  (1.88 m), weight 76.204 kg (168 lb), SpO2 99.00%.Body mass index is 21.56 kg/(m^2).  General Appearance: Disheveled  Eye Contact::  Good  Speech:  Normal Rate  Volume:  Normal  Mood:  Mild depression  Affect:  Congruent  Thought Process:  Goal Directed   Orientation:  Full (Time, Place, and Person)  Thought Content:  Hallucinations: Auditory decreasing now almost gone.  Suicidal Thoughts:  No  Homicidal Thoughts:  No  Memory:  Immediate;   Fair  Judgement:  improving  Insight:  Lacking  Psychomotor Activity:  Normal  Concentration:  Fair  Recall:  Poor  Akathisia:  No  Handed:  Right  AIMS (if indicated):     Assets:  Communication Skills Desire for Improvement Physical Health  Sleep:  Number of Hours: 5.75   Current Medications: Current Facility-Administered Medications  Medication Dose Route Frequency Provider Last Rate Last Dose  . acetaminophen (TYLENOL) tablet 650 mg  650 mg Oral Q6H PRN Shuvon Rankin, NP   650 mg at 05/23/13 2107  . alum & mag hydroxide-simeth (MAALOX/MYLANTA) 200-200-20 MG/5ML suspension 30 mL  30 mL Oral Q4H PRN Shuvon Rankin, NP      . ARIPiprazole (ABILIFY) tablet 20 mg  20 mg Oral Daily Verne Spurr, PA-C      . benztropine (COGENTIN) tablet 1 mg  1 mg Oral QHS Shuvon Rankin, NP   1 mg at 05/23/13 2107  . LORazepam (ATIVAN) tablet 1 mg  1 mg Oral Q8H PRN Shuvon Rankin, NP   1 mg at 05/23/13 0736  . magnesium hydroxide (MILK OF MAGNESIA) suspension 30 mL  30 mL Oral Daily PRN Shuvon Rankin, NP      . nicotine (NICODERM CQ - dosed in mg/24 hours) patch 21 mg  21 mg Transdermal  Daily Shuvon Rankin, NP   21 mg at 05/24/13 9604    Lab Results:  Results for orders placed during the Norton encounter of 05/21/13 (from the past 48 hour(s))  GC/CHLAMYDIA PROBE AMP     Status: None   Collection Time    05/22/13  9:34 AM      Result Value Range   CT Probe RNA NEGATIVE  NEGATIVE   GC Probe RNA NEGATIVE  NEGATIVE   Comment: (NOTE)                                                                                               Normal Reference Range: Negative          Assay performed using the Gen-Probe APTIMA COMBO2 (R) Assay.     Acceptable specimen types for this assay include APTIMA Swabs (Unisex,     endocervical, urethral, or vaginal), first void urine, and ThinPrep     liquid based cytology samples.    Physical Findings: AIMS: Facial and Oral Movements Muscles of Facial Expression: None, normal Lips and Perioral Area: None, normal Jaw: None, normal Tongue: None, normal,Extremity Movements Upper (arms, wrists, hands, fingers): None, normal Lower (legs, knees, ankles, toes): None, normal, Trunk Movements Neck, shoulders, hips: None, normal, Overall Severity Severity of abnormal movements (highest score from questions above): None, normal Incapacitation due to abnormal movements: None, normal Patient's awareness of abnormal movements (rate only patient's report): No Awareness, Dental Status Current problems with teeth and/or dentures?:  (toothache last night) Does patient usually wear dentures?: No  CIWA:  CIWA-Ar Total: 2 COWS:     Treatment Plan Summary: Daily contact with patient to assess and evaluate symptoms and progress in treatment Medication management  Plan: 1. Continue crisis management and stabilization. 2. Medication management to reduce current symptoms to base line and improve patient's overall level of functioning 3. Treat health problems as indicated. 4. Develop treatment plan to decrease risk of relapse upon discharge and the need for readmission. 5. Psycho-social education regarding relapse prevention and self care. 6. Health care follow up as needed for medical problems. 7. Continue home medications where appropriate. 8. Continue Abilify 20mg  po qd. 9. Will continue NSAID. 10. Will add orajel for toothache. 11. D/C home tomorrow.   Medical Decision Making Problem Points:  Established problem,  stable/improving (1), New problem, with no additional work-up planned (3) and Review of psycho-social stressors (1) Data Points:  Review or order clinical lab tests (1) Review of new medications or change in dosage (2)  I certify that inpatient services furnished can reasonably be expected to improve the patient's condition.  Rona Ravens. Ashaun Gaughan RPAC 11:37 AM 05/25/2013
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
## 2013-05-25 NOTE — Progress Notes (Signed)
Patient ID: Nathan Norton, male   DOB: Nov 17, 1981, 32 y.o.   MRN: [id number] D- Patient reports he slept well despite having a toothache during the night.  His appetite is good and his energy level is normal.  His ability to pay attention is good and he is attending groups and interacting with peers and staff.  A- Asked patient if he is still hearing voices. R- Initially he said no, but then admitted that they are present but more "mumbling" now.  He also says his elbow  only hurts when he  Is moving  It.  He denies thoughts of hurting himself.

## 2013-05-25 NOTE — Progress Notes (Signed)
Patient ID: Nathan Norton, male   DOB: 28-Mar-1981, 32 y.o.   MRN: [id number]  D: Patient with dull, flat affect endorsing depression. No inappropriate behaviors noted.  A: Q 15 minute safety checks for safety, encourage staff/peer interaction and group participation. Administer medications as ordered by MD. R: Pt denies SI/HI or hallucinations at this time.
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
## 2013-05-26 DIAGNOSIS — F259 Schizoaffective disorder, unspecified: Secondary | ICD-10-CM

## 2013-05-26 NOTE — Progress Notes (Addendum)
Recreation Therapy Notes  Date: 05.28.2014 Time: 9:30am  Location: [address] Dayroom  Group Topic/Focus: Problem Solving  Participation Level:  Active  Participation Quality:  Appropriate  Affect:  Euthymic  Cognitive:  Appropriate  Additional Comments: Activity: Life Boat ; Explanation: LRT told patients the following scenario: You have charted a yacht for the afternoon, half way through your trip the boat springs a leak. You can saves yourselves as well as 8 of the following people: Darliss Cheney, Laverta Baltimore, Pregnant Woman, Male physician, Ex-Marine, Curator, [place], Ex-Convict, Rabbi, Anthonette Legato, Optician, dispensing, Runner, broadcasting/film/video, Investment banker, operational, Engineer, civil (consulting).   Patient actively participated in group activity. Patient have reasoning behind saving individuals he wanted on final list. Patient debated with peers about who he wanted to save and asked for their opinion as the group list was forming. Group agreed on saving the following individuals: Darliss Cheney, Pregnant Woman, Ex-Marine, Mitzi Hansen, Runner, broadcasting/film/video, Chef, and Nurse. Patient gave logical reasoning for saving the individuals on the group list.   Jearl Klinefelter, LRT/CTRS  Jearl Klinefelter 05/26/2013 1:07 PM

## 2013-05-26 NOTE — Progress Notes (Signed)
[facility] Adult Case Management Discharge Plan :  Will you be returning to the same living situation after discharge: Yes,  home At discharge, do you have transportation home?:Yes,  bus pass Do you have the ability to pay for your medications:Yes,  ACT team  Release of information consent forms completed and in the chart;  Patient's signature needed at discharge.  Patient to Follow up at:   Patient denies SI/HI:   Yes,  yes    Safety Planning and Suicide Prevention discussed:  Yes,  yes  Ida Rogue 05/26/2013, 9:31 AM

## 2013-05-26 NOTE — [facility] Suicide Risk Assessment (Signed)
Suicide Risk Assessment  Discharge Assessment     Demographic Factors:  Male, Low socioeconomic status and Unemployed  Mental Status Per Nursing Assessment::   On Admission:   (denies any si/hi/av. )  Current Mental Status by Physician: patient denies suicidal ideation, intent or plan  Loss Factors: Decrease in vocational status and Financial problems/change in socioeconomic status  Historical Factors: Family history of mental illness or substance abuse  Risk Reduction Factors:   Sense of responsibility to family, Living with another person, especially a relative and Positive social support  Continued Clinical Symptoms:  Alcohol/Substance Abuse/Dependencies  Cognitive Features That Contribute To Risk:  Closed-mindedness Polarized thinking    Suicide Risk:  Minimal: No identifiable suicidal ideation.  Patients presenting with no risk factors but with morbid ruminations; may be classified as minimal risk based on the severity of the depressive symptoms  Discharge Diagnoses:   AXIS I:  Schizoaffective disorder-bipolar disorder              Cannabis use disorder. Alcohol abuse  AXIS II:  Deferred AXIS III:   Past Medical History  Diagnosis Date   AXIS IV:  other psychosocial or environmental problems and problems related to social environment AXIS V:  61-70 mild symptoms  Plan Of Care/Follow-up recommendations:  Activity:  as tolerated Diet:  healthy Tests:  routine blood test Other:  patient to keep his after care appointment.  Is patient on multiple antipsychotic therapies at discharge:  No   Has Patient had three or more failed trials of antipsychotic monotherapy by history:  No  Recommended Plan for Multiple Antipsychotic Therapies: N/A  Lateria Alderman,MD 05/26/2013, 9:52 AM

## 2013-05-26 NOTE — Progress Notes (Signed)
Patient ID: Nathan Norton, male   DOB: [birth year], 32 y.o.   MRN: [id number] Patient discharged per physician order; patient denies SI/HI and A/V hallucinations; patient received copy of AVS, samples, and prescriptions after it was reviewed; patient had no other concerns or complaints at this time; patient left the unit ambulatory with the bus pass; patient verbalized and signed that he received all his belongings

## 2013-05-26 NOTE — Tx Team (Signed)
  Interdisciplinary Treatment Plan Update   Date Reviewed:  05/26/2013  Time Reviewed:  10:09 AM  Progress in Treatment:   Attending groups: Yes Participating in groups: Yes Taking medication as prescribed: Yes  Tolerating medication: Yes Family/Significant other contact made: Yes  Patient understands diagnosis: Yes  Discussing patient identified problems/goals with staff: Yes Medical problems stabilized or resolved: Yes Denies suicidal/homicidal ideation: Yes  In tx team Patient has not harmed self or others: Yes  For review of initial/current patient goals, please see plan of care.  Estimated Length of Stay:  D/C today  Reason for Continuation of Hospitalization:   New Problems/Goals identified:  N/A  Discharge Plan or Barriers:   return home, follow up outpt  Additional Comments:  Attendees:  Signature: Thedore Mins, MD 05/26/2013 10:09 AM   Signature: Richelle Ito, LCSW 05/26/2013 10:09 AM  Signature: Verne Spurr, PA 05/26/2013 10:09 AM  Signature: Leighton Parody, RN  05/26/2013 10:09 AM  Signature: Liborio Nixon, RN 05/26/2013 10:09 AM  Signature:  05/26/2013 10:09 AM  Signature:   05/26/2013 10:09 AM  Signature:    Signature:    Signature:    Signature:    Signature:    Signature:      Scribe for Treatment Team:   Richelle Ito, LCSW  05/26/2013 10:09 AM

## 2013-05-26 NOTE — [facility] Suicide Risk Assessment (Signed)
[facility] INPATIENT:  Family/Significant Other Suicide Prevention Education  Suicide Prevention Education:  Education Completed; No one has been identified by the patient as the family member/significant other with whom the patient will be residing, and identified as the person(s) who will aid the patient in the event of a mental health crisis (suicidal ideations/suicide attempt).  With written consent from the patient, the family member/significant other has been provided the following suicide prevention education, prior to the and/or following the discharge of the patient.  The suicide prevention education provided includes the following:  Suicide risk factors  Suicide prevention and interventions  National Suicide Hotline telephone number  [facility] assessment telephone number  [facility] Emergency Assistance 911  [facility] and/or Residential Mobile Crisis Unit telephone number  Request made of family/significant other to:  Remove weapons (e.g., guns, rifles, knives), all items previously/currently identified as safety concern.    Remove drugs/medications (over-the-counter, prescriptions, illicit drugs), all items previously/currently identified as a safety concern.  The family member/significant other verbalizes understanding of the suicide prevention education information provided.  The family member/significant other agrees to remove the items of safety concern listed above.  Nathan Norton did not endorse SI at admission, nor has he complained of SI during his stay.  SPE not required.  Nathan Norton B 05/26/2013, 10:17 AM

## 2013-05-28 NOTE — Progress Notes (Addendum)
Patient Discharge Instructions:  After Visit Summary (AVS):   Faxed to:  05/28/13 Psychiatric Admission Assessment Note:   Faxed to:  05/28/13 Suicide Risk Assessment - Discharge Assessment:   Faxed to:  05/28/13 Faxed/Sent to the Next Level Care provider:  05/28/13 Faxed to [facility] @ [phone]  Jerelene Redden, 05/28/2013, 4:02 PM

## 2013-06-02 NOTE — Discharge Summary (Signed)
Seen and agreed. Nazyia Gaugh, MD 

## 2013-06-15 ENCOUNTER — Emergency Department (HOSPITAL_COMMUNITY)
Admission: EM | Admit: 2013-06-15 | Discharge: 2013-06-15 | Disposition: A | Discharge status: Home / Self Care | Payer: Medicare Other | Attending: Emergency Medicine | Admitting: Emergency Medicine

## 2013-06-15 ENCOUNTER — Emergency Department (HOSPITAL_COMMUNITY): Payer: Medicare Other

## 2013-06-15 ENCOUNTER — Encounter (HOSPITAL_COMMUNITY): Payer: Self-pay | Admitting: Cardiology

## 2013-06-15 DIAGNOSIS — R509 Fever, unspecified: Secondary | ICD-10-CM | POA: Insufficient documentation

## 2013-06-15 DIAGNOSIS — M25529 Pain in unspecified elbow: Secondary | ICD-10-CM | POA: Insufficient documentation

## 2013-06-15 DIAGNOSIS — R05 Cough: Secondary | ICD-10-CM | POA: Insufficient documentation

## 2013-06-15 DIAGNOSIS — F17200 Nicotine dependence, unspecified, uncomplicated: Secondary | ICD-10-CM | POA: Insufficient documentation

## 2013-06-15 DIAGNOSIS — L293 Anogenital pruritus, unspecified: Secondary | ICD-10-CM | POA: Insufficient documentation

## 2013-06-15 DIAGNOSIS — R059 Cough, unspecified: Secondary | ICD-10-CM | POA: Insufficient documentation

## 2013-06-15 DIAGNOSIS — Z791 Long term (current) use of non-steroidal anti-inflammatories (NSAID): Secondary | ICD-10-CM | POA: Insufficient documentation

## 2013-06-15 DIAGNOSIS — M25521 Pain in right elbow: Secondary | ICD-10-CM

## 2013-06-15 DIAGNOSIS — Z8659 Personal history of other mental and behavioral disorders: Secondary | ICD-10-CM | POA: Insufficient documentation

## 2013-06-15 DIAGNOSIS — R21 Rash and other nonspecific skin eruption: Secondary | ICD-10-CM | POA: Insufficient documentation

## 2013-06-15 NOTE — ED Notes (Signed)
Pt ambulatory to br

## 2013-06-15 NOTE — ED Provider Notes (Signed)
History  This chart was scribed for Nathan Dredge, PA-C, working with Glynn Octave, MD by Ardelia Mems, ED Scribe. This patient was seen in room TR09C/TR09C and the patient's care was started at 6:03 PM.   CSN: [id number]  Arrival date & time 06/15/13  1731     Chief Complaint  Patient presents with  . Rash     Patient is a 32 y.o. male presenting with rash. The history is provided by the patient. No language interpreter was used.  Rash Associated symptoms: chills, cough and fever   Associated symptoms: no chest pain, no diarrhea, no nausea, no shortness of breath, no sore throat and no vomiting      HPI Comments: Nathan Norton is a 31 y.o. male with a h/o bipolar schizoaffective disorder who presents to the Emergency Department complaining of spreading, gradually worsening penile rash first noticed by pt 2 weeks ago. Pt reports associated intermittent penile itching, and states that no lesions of the rash have popped. Pt states that scrubbing the area with soap in the shower has irritated the rash. Pt states that he is sexually active and does not always use protection. Pt had unprotected sex about 3 months ago. Denies fevers, chills, body aches, abdominal pain, N/V/D, testicular pain or swelling, penile discharge.    Pt also reports constant, moderate right elbow pain.that radiates down his right forearm without weakness or numbness onset 1 week ago when pt believes he fell on his arm while drunk. Pt's elbow pain is worsened with ROM.   Pt reports having a cough which he attributes to seasonal allergies.  Does state he has had night sweats for about 1 month. Denies abnormal weight loss, hemoptysis, SOB, chest pain, sore throat, congestion or any other symptoms.  PCP- Dr. Mirna Mires, [facility], [facility]    Past Medical History  Diagnosis Date  . Schizoaffective disorder, bipolar type     History reviewed. No pertinent past surgical history.  History reviewed. No  pertinent family history.  History  Substance Use Topics  . Smoking status: Current Every Day Smoker -- 1.00 packs/day for 10 years    Types: Cigarettes  . Smokeless tobacco: Not on file  . Alcohol Use: 0.6 oz/week    1 Cans of beer per week     Comment: Drinks 40oz every day       Review of Systems  Constitutional: Positive for fever and chills.  HENT: Negative for congestion and sore throat.   Respiratory: Positive for cough. Negative for shortness of breath.   Cardiovascular: Negative for chest pain.  Gastrointestinal: Negative for nausea, vomiting, abdominal pain and diarrhea.  Genitourinary: Negative for urgency, frequency, discharge, penile swelling, scrotal swelling, penile pain and testicular pain.  Musculoskeletal:       Right elbow pain  Skin: Positive for rash.  Neurological: Negative for weakness and numbness.   As per HPI  Allergies  Shellfish allergy  Home Medications   Current Outpatient Rx  Name  Route  Sig  Dispense  Refill  . ARIPiprazole (ABILIFY) 10 MG tablet      Take one table (10mg ) twice a day for schizophrenia.   60 tablet   0   . benztropine (COGENTIN) 1 MG tablet   Oral   Take 1 tablet (1 mg total) by mouth at bedtime. For prevention of side effects.   30 tablet   0     Triage Vitals: BP 131/83  Pulse 69  Temp(Src) 98.2 F (36.8 C) (Oral)  Resp 16  SpO2 100%  Physical Exam  Nursing note and vitals reviewed. Constitutional: He appears well-developed and well-nourished. No distress.  HENT:  Head: Normocephalic and atraumatic.  Neck: Neck supple.  Pulmonary/Chest: Effort normal.  Abdominal: Soft. He exhibits no distension. There is no tenderness. There is no rebound and no guarding.  Genitourinary: Testes normal. Right testis shows no mass, no swelling and no tenderness. Right testis is descended. Left testis shows no mass, no swelling and no tenderness. Left testis is descended. Circumcised.  Musculoskeletal:       Right  elbow: He exhibits normal range of motion, no swelling, no effusion, no deformity and no laceration. Tenderness found. Radial head, medial epicondyle, lateral epicondyle and olecranon process tenderness noted.  Diffuse bony tenderness to right elbow without swelling or crepitus.  Full AROM but with pain.  Distal pulses and sensation intact.  No skin changes.   Neurological: He is alert.  Skin: He is not diaphoretic.  Small vesicles vs pustules over penile shaft sparing the glans.      ED Course  Procedures (including critical care time)  DIAGNOSTIC STUDIES: Oxygen Saturation is 100% on RA, normal by my interpretation.    COORDINATION OF CARE: 6:33 PM- Pt advised of plan for treatment and pt agrees.  Labs Reviewed  GC/CHLAMYDIA PROBE AMP  HERPES SIMPLEX VIRUS CULTURE  RPR   Dg Elbow Complete Right  06/15/2013   *RADIOLOGY REPORT*  Clinical Data: Right elbow pain.  RIGHT ELBOW - COMPLETE 3+ VIEW  Comparison: None.  Findings: No fracture or dislocation is noted.  Joint spaces are intact. No soft tissue abnormality is noted.  No abnormal fat pad displacement is noted.  IMPRESSION: Normal right elbow.   Original Report Authenticated By: Lupita Raider.,  M.D.     1. Penile rash   2. Right elbow pain     MDM  Pt with penile rash - ? herpes.  GC/Chlam, RPR, herpes tests sent.  Pt also with c/o elbow pain.  Xray negative.  No systemic symptoms with exception of ?night sweats x 1 month.  D/C home with advised PCP f/u Discussed all results with patient.  Pt given return precautions.  Pt verbalizes understanding and agrees with plan.       I personally performed the services described in this documentation, which was scribed in my presence. The recorded information has been reviewed and is accurate.    Nathan Dredge, PA-C 06/15/13 2014

## 2013-06-15 NOTE — ED Notes (Addendum)
Pt reports that he noticed a rash on his penis a couple of days ago. Reports that he was tested for HIV and it was negative. Is sexually active and does not always use protection. Pt reports right arm pain also.

## 2013-06-15 NOTE — ED Provider Notes (Signed)
Medical screening examination/treatment/procedure(s) were performed by non-physician practitioner and as supervising physician I was immediately available for consultation/collaboration.   Glynn Octave, MD 06/15/13 2350

## 2013-06-15 NOTE — ED Notes (Signed)
Pt dc to home. Pt sts understanding to dc instructions. Pt ambulatory to exit without difficulty. 

## 2013-06-17 LAB — HERPES SIMPLEX VIRUS CULTURE: Culture: NOT DETECTED

## 2013-08-25 ENCOUNTER — Ambulatory Visit: Payer: Medicare Other | Attending: Internal Medicine | Admitting: Internal Medicine

## 2013-08-25 VITALS — BP 124/84 | HR 98 | Temp 98.2°F | Resp 16 | Ht 74.0 in | Wt 191.6 lb

## 2013-08-25 DIAGNOSIS — N489 Disorder of penis, unspecified: Secondary | ICD-10-CM

## 2013-08-25 DIAGNOSIS — R21 Rash and other nonspecific skin eruption: Secondary | ICD-10-CM | POA: Insufficient documentation

## 2013-08-25 DIAGNOSIS — A64 Unspecified sexually transmitted disease: Secondary | ICD-10-CM

## 2013-08-25 MED ORDER — ACYCLOVIR 400 MG PO TABS: 400.0000 mg | ORAL_TABLET | Freq: Every day | ORAL | Status: AC

## 2013-08-25 NOTE — Progress Notes (Signed)
Patient here to establish care and needs physical

## 2013-08-25 NOTE — Progress Notes (Signed)
Patient ID: Nathan Norton, male   DOB: 1981/08/28, 32 y.o.   MRN: [id number]  CC:  HPI: 32 year old male who presents for evaluation of a penile rash. He also has schizoaffective disorder and gets is medications for [facility]. He's had this rash for about a couple of weeks. He was in the ER on 6/17 with a similar rash his GC/Chlamydia/RPR was negative  Allergies  Allergen Reactions  . Shellfish Allergy Anaphylaxis   Past Medical History  Diagnosis Date  . Schizoaffective disorder, bipolar type    Current Outpatient Prescriptions on File Prior to Visit  Medication Sig Dispense Refill  . ARIPiprazole (ABILIFY) 10 MG tablet Take one table (10mg ) twice a day for schizophrenia.  60 tablet  0  . benztropine (COGENTIN) 1 MG tablet Take 1 tablet (1 mg total) by mouth at bedtime. For prevention of side effects.  30 tablet  0   No current facility-administered medications on file prior to visit.   History reviewed. No pertinent family history. History   Social History  . Marital Status: Single    Spouse Name: N/A    Number of Children: N/A  . Years of Education: N/A   Occupational History  . Not on file.   Social History Main Topics  . Smoking status: Current Every Day Smoker -- 1.00 packs/day for 10 years    Types: Cigarettes  . Smokeless tobacco: Not on file  . Alcohol Use: 0.6 oz/week    1 Cans of beer per week     Comment: Drinks 40oz every day   . Drug Use: Yes    Special: Marijuana  . Sexual Activity: Yes    Birth Control/ Protection: Condom   Other Topics Concern  . Not on file   Social History Narrative  . No narrative on file    Review of Systems  Constitutional: Negative for fever, chills, diaphoresis, activity change, appetite change and fatigue.  HENT: Negative for ear pain, nosebleeds, congestion, facial swelling, rhinorrhea, neck pain, neck stiffness and ear discharge.   Eyes: Negative for pain, discharge, redness, itching and visual disturbance.   Respiratory: Negative for cough, choking, chest tightness, shortness of breath, wheezing and stridor.   Cardiovascular: Negative for chest pain, palpitations and leg swelling.  Gastrointestinal: Negative for abdominal distention.  Genitourinary: Negative for dysuria, urgency, frequency, hematuria, flank pain, decreased urine volume, difficulty urinating and dyspareunia.  Musculoskeletal: Negative for back pain, joint swelling, arthralgias and gait problem.  Neurological: Negative for dizziness, tremors, seizures, syncope, facial asymmetry, speech difficulty, weakness, light-headedness, numbness and headaches.  Hematological: Negative for adenopathy. Does not bruise/bleed easily.  Psychiatric/Behavioral: Negative for hallucinations, behavioral problems, confusion, dysphoric mood, decreased concentration and agitation.    Objective:   Filed Vitals:   08/25/13 1143  BP: 124/84  Pulse: 98  Temp: 98.2 F (36.8 C)  Resp: 16    Physical Exam  Constitutional: Appears well-developed and well-nourished. No distress.  HENT: Normocephalic. External right and left ear normal. Oropharynx is clear and moist.  Eyes: Conjunctivae and EOM are normal. PERRLA, no scleral icterus.  Neck: Normal ROM. Neck supple. No JVD. No tracheal deviation. No thyromegaly.  CVS: RRR, S1/S2 +, no murmurs, no gallops, no carotid bruit.  Pulmonary: Effort and breath sounds normal, no stridor, rhonchi, wheezes, rales.  Abdominal: Soft. BS +,  no distension, tenderness, rebound or guarding.  Musculoskeletal: Normal range of motion. No edema and no tenderness.  Lymphadenopathy: No lymphadenopathy noted, cervical, inguinal. Neuro: Alert. Normal reflexes, muscle tone coordination. No  cranial nerve deficit. Skin: Skin is warm and dry. No rash noted. Not diaphoretic. No erythema. No pallor.  Psychiatric: Normal mood and affect. Behavior, judgment, thought content normal.   Lab Results  Component Value Date   WBC 7.5  05/21/2013   HGB 13.5 05/21/2013   HCT 39.1 05/21/2013   MCV 99.7 05/21/2013   PLT 480* 05/21/2013   Lab Results  Component Value Date   CREATININE 1.30 05/21/2013   BUN 10 05/21/2013   NA 136 05/21/2013   K 3.9 05/21/2013   CL 98 05/21/2013   CO2 25 05/21/2013    No results found for this basename: HGBA1C   Lipid Panel  No results found for this basename: chol, trig, hdl, cholhdl, vldl, ldlcalc       Assessment and plan:   Patient Active Problem List   Diagnosis Date Noted  . 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 The patient verbalized understanding. The patient was told to call to get any lab results if not heard anything in the next week.

## 2013-08-26 LAB — COMPREHENSIVE METABOLIC PANEL
Albumin: 4.3 g/dL (ref 3.5–5.2)
BUN: 15 mg/dL (ref 6–23)
CO2: 29 mEq/L (ref 19–32)
Calcium: 9.9 mg/dL (ref 8.4–10.5)
Chloride: 103 mEq/L (ref 96–112)
Glucose, Bld: 85 mg/dL (ref 70–99)
Potassium: 4.3 mEq/L (ref 3.5–5.3)
Total Protein: 7.7 g/dL (ref 6.0–8.3)

## 2013-08-26 LAB — CBC WITH DIFFERENTIAL/PLATELET
Basophils Relative: 1 % (ref 0–1)
Hemoglobin: 13.7 g/dL (ref 13.0–17.0)
Lymphocytes Relative: 17 % (ref 12–46)
Lymphs Abs: 1.1 10*3/uL (ref 0.7–4.0)
Monocytes Relative: 5 % (ref 3–12)
Neutro Abs: 4.6 10*3/uL (ref 1.7–7.7)
Neutrophils Relative %: 74 % (ref 43–77)
RBC: 4.31 MIL/uL (ref 4.22–5.81)
WBC: 6.2 10*3/uL (ref 4.0–10.5)

## 2013-08-26 LAB — HIV ANTIBODY (ROUTINE TESTING W REFLEX): HIV: NONREACTIVE

## 2013-10-25 ENCOUNTER — Ambulatory Visit: Payer: Medicare Other | Attending: Internal Medicine | Admitting: Internal Medicine

## 2013-10-25 ENCOUNTER — Encounter: Payer: Self-pay | Admitting: Internal Medicine

## 2013-10-25 VITALS — BP 107/62 | HR 72 | Temp 98.1°F | Resp 16 | Wt 201.0 lb

## 2013-10-25 DIAGNOSIS — B009 Herpesviral infection, unspecified: Secondary | ICD-10-CM

## 2013-10-25 DIAGNOSIS — A6000 Herpesviral infection of urogenital system, unspecified: Secondary | ICD-10-CM | POA: Insufficient documentation

## 2013-10-25 HISTORY — DX: Herpesviral infection, unspecified: B00.9

## 2013-10-25 MED ORDER — ACYCLOVIR 200 MG PO CAPS: 400.0000 mg | ORAL_CAPSULE | Freq: Two times a day (BID) | ORAL | Status: DC

## 2013-10-25 MED ORDER — ACYCLOVIR 5 % EX OINT: TOPICAL_OINTMENT | CUTANEOUS | Status: DC

## 2013-10-25 NOTE — Progress Notes (Signed)
Pt is here for a f/u on genital rash/herpes Reports he was given a medication last time he was here Reports rash is spreading... Denies pain, fevers States they burst and drain Also needing results from recent labs.  Alert w/no signs of acute distress.

## 2013-10-25 NOTE — Patient Instructions (Signed)
Herpes Simplex Herpes simplex is generally classified as Type 1 or Type 2. Type 1 is generally the type that is responsible for cold sores. Type 2 is generally associated with sexually transmitted diseases. We now know that most of the thoughts on these viruses are inaccurate. We find that HSV1 is also present genitally and HSV2 can be present orally, but this will vary in different locations of the world. Herpes simplex is usually detected by doing a culture. Blood tests are also available for this virus; however, the accuracy is often not as good.  PREPARATION FOR TEST No preparation or fasting is necessary. NORMAL FINDINGS  No virus present  No HSV antigens or antibodies present Ranges for normal findings may vary among different laboratories and hospitals. You should always check with your doctor after having lab work or other tests done to discuss the meaning of your test results and whether your values are considered within normal limits. MEANING OF TEST  Your caregiver will go over the test results with you and discuss the importance and meaning of your results, as well as treatment options and the need for additional tests if necessary. OBTAINING THE TEST RESULTS  It is your responsibility to obtain your test results. Ask the lab or department performing the test when and how you will get your results. Document Released: 01/18/2005 Document Revised: 03/09/2012 Document Reviewed: 11/26/2008 [employer] Patient Information 2014 [employer].  

## 2013-10-25 NOTE — Progress Notes (Signed)
Patient ID: Nathan Norton, male   DOB: Nov 08, 1981, 32 y.o.   MRN: [id number]  CC: follow up  HPI: 32 year old male with no significant past medical history who presented to clinic for follow up of genital herpes. He said there is still some left over rash on penile area. He denies fever or chills. Uses condoms inconsitently. Reports having same sexual partner.  Allergies  Allergen Reactions  . Shellfish Allergy Anaphylaxis   Past Medical History  Diagnosis Date  . Schizoaffective disorder, bipolar type    Current Outpatient Prescriptions on File Prior to Visit  Medication Sig Dispense Refill  . ARIPiprazole (ABILIFY) 10 MG tablet Take one table (10mg ) twice a day for schizophrenia.  60 tablet  0  . benztropine (COGENTIN) 1 MG tablet Take 1 tablet (1 mg total) by mouth at bedtime. For prevention of side effects.  30 tablet  0   No current facility-administered medications on file prior to visit.   Heart disease in parents.   History   Social History  . Marital Status: Single    Spouse Name: N/A    Number of Children: N/A  . Years of Education: N/A   Occupational History  . Not on file.   Social History Main Topics  . Smoking status: Current Every Day Smoker -- 1.00 packs/day for 10 years    Types: Cigarettes  . Smokeless tobacco: Not on file  . Alcohol Use: 0.6 oz/week    1 Cans of beer per week     Comment: Drinks 40oz every day   . Drug Use: Yes    Special: Marijuana  . Sexual Activity: Yes    Birth Control/ Protection: Condom   Other Topics Concern  . Not on file   Social History Narrative  . No narrative on file    Review of Systems  Constitutional: Negative for fever, chills, diaphoresis, activity change, appetite change and fatigue.  HENT: Negative for ear pain, nosebleeds, congestion, facial swelling, rhinorrhea, neck pain, neck stiffness and ear discharge.   Eyes: Negative for pain, discharge, redness, itching and visual disturbance.  Respiratory:  Negative for cough, choking, chest tightness, shortness of breath, wheezing and stridor.   Cardiovascular: Negative for chest pain, palpitations and leg swelling.  Gastrointestinal: Negative for abdominal distention.  Genitourinary: Negative for dysuria, urgency, frequency, hematuria, flank pain, decreased urine volume, difficulty urinating and dyspareunia. Positive for herpes rash on penile area Musculoskeletal: Negative for back pain, joint swelling, arthralgias and gait problem.  Neurological: Negative for dizziness, tremors, seizures, syncope, facial asymmetry, speech difficulty, weakness, light-headedness, numbness and headaches.  Hematological: Negative for adenopathy. Does not bruise/bleed easily.  Psychiatric/Behavioral: Negative for hallucinations, behavioral problems, confusion, dysphoric mood, decreased concentration and agitation.    Objective:   Filed Vitals:   10/25/13 1015  BP: 107/62  Pulse: 72  Temp: 98.1 F (36.7 C)  Resp: 16    Physical Exam  Constitutional: Appears well-developed and well-nourished. No distress.  HENT: Normocephalic. External right and left ear normal. Oropharynx is clear and moist.  Eyes: Conjunctivae and EOM are normal. PERRLA, no scleral icterus.  Neck: Normal ROM. Neck supple. No JVD. No tracheal deviation. No thyromegaly.  CVS: RRR, S1/S2 +, no murmurs, no gallops, no carotid bruit.  Pulmonary: Effort and breath sounds normal, no stridor, rhonchi, wheezes, rales.  Abdominal: Soft. BS +,  no distension, tenderness, rebound or guarding.  Musculoskeletal: Normal range of motion. No edema and no tenderness.  Lymphadenopathy: No lymphadenopathy noted, cervical, inguinal. Neuro:  Alert. Normal reflexes, muscle tone coordination. No cranial nerve deficit. Skin: Skin is warm and dry. No rash noted. Not diaphoretic. No erythema. No pallor.  Psychiatric: Normal mood and affect. Behavior, judgment, thought content normal.   Lab Results  Component  Value Date   WBC 6.2 08/25/2013   HGB 13.7 08/25/2013   HCT 41.6 08/25/2013   MCV 96.5 08/25/2013   PLT 333 08/25/2013   Lab Results  Component Value Date   CREATININE 1.32 08/25/2013   BUN 15 08/25/2013   NA 142 08/25/2013   K 4.3 08/25/2013   CL 103 08/25/2013   CO2 29 08/25/2013    No results found for this basename: HGBA1C   Lipid Panel  No results found for this basename: chol, trig, hdl, cholhdl, vldl, ldlcalc       Assessment and plan:   Patient Active Problem List   Diagnosis Date Noted  . HSV-genital area - continue acyclovir for 5 more day and add acyclovir ointment. If no improvement will refer to ID clinic 05/22/2013

## 2013-11-23 ENCOUNTER — Telehealth: Payer: Self-pay | Admitting: Internal Medicine

## 2013-11-23 NOTE — Telephone Encounter (Signed)
Pt says that meds prescribed for rash on penis not working.  Also asked if MRSA would cause prolonged rash.  Please f/u with pt.

## 2013-11-24 ENCOUNTER — Emergency Department (HOSPITAL_COMMUNITY)
Admission: EM | Admit: 2013-11-24 | Discharge: 2013-11-24 | Disposition: A | Discharge status: Home / Self Care | Payer: Medicare Other | Attending: Emergency Medicine | Admitting: Emergency Medicine

## 2013-11-24 ENCOUNTER — Encounter (HOSPITAL_COMMUNITY): Payer: Self-pay | Admitting: Emergency Medicine

## 2013-11-24 DIAGNOSIS — F17200 Nicotine dependence, unspecified, uncomplicated: Secondary | ICD-10-CM | POA: Insufficient documentation

## 2013-11-24 DIAGNOSIS — R21 Rash and other nonspecific skin eruption: Secondary | ICD-10-CM | POA: Insufficient documentation

## 2013-11-24 DIAGNOSIS — Z79899 Other long term (current) drug therapy: Secondary | ICD-10-CM | POA: Insufficient documentation

## 2013-11-24 DIAGNOSIS — F319 Bipolar disorder, unspecified: Secondary | ICD-10-CM | POA: Insufficient documentation

## 2013-11-24 NOTE — ED Notes (Signed)
Erin PA made aware of PT in [facility] ED

## 2013-11-24 NOTE — ED Notes (Signed)
Genital rash x1.5 months. Has been seen previously at [facility] for same. States that problem is getting worse. White bumps present on penis with erythema. Ambulatory. NAD

## 2013-11-24 NOTE — ED Provider Notes (Signed)
CSN: [id number]     Arrival date & time 11/24/13  1629 History  This chart was scribed for non-physician practitioner Junius Finner, PA-C working with Hilario Quarry, MD by Danella Maiers, ED Scribe. This patient was seen in room P01C/P01C and the patient's care was started at 4:55 PM.    Chief Complaint  Patient presents with  . Rash   The history is provided by the patient. No language interpreter was used.   HPI Comments: Nathan Norton is a 32 y.o. male who presents to the Emergency Department complaining of itchy white pus pockets to his penis area for the past 1.5 months. He reports they are spreading and sometimes turn red. He has been seen at [facility] and [facility] and has been tested for STDs, blood work was negative. His partners have also tested negative for STDs. They gave him cream at the [facility] but he can not remember what kind and states it has not helped. He denies pain, discharge, fevers, nausea, vomiting.    Past Medical History  Diagnosis Date  . Schizoaffective disorder, bipolar type    History reviewed. No pertinent past surgical history. History reviewed. No pertinent family history. History  Substance Use Topics  . Smoking status: Current Every Day Smoker -- 1.00 packs/day for 10 years    Types: Cigarettes  . Smokeless tobacco: Not on file  . Alcohol Use: 0.6 oz/week    1 Cans of beer per week     Comment: Drinks 40oz every day     Review of Systems  Constitutional: Negative for fever.  Gastrointestinal: Negative for nausea and vomiting.  Genitourinary: Negative for discharge, penile pain and testicular pain.  Skin: Positive for rash.  All other systems reviewed and are negative.    Allergies  Shellfish allergy  Home Medications   Current Outpatient Rx  Name  Route  Sig  Dispense  Refill  . ARIPiprazole (ABILIFY) 10 MG tablet      Take one table (10mg ) twice a day for schizophrenia.   60 tablet   0   . benztropine  (COGENTIN) 1 MG tablet   Oral   Take 1 tablet (1 mg total) by mouth at bedtime. For prevention of side effects.   30 tablet   0    BP 143/79  Pulse 91  Temp(Src) 97.5 F (36.4 C) (Oral)  Resp 20  Wt 202 lb 6.4 oz (91.808 kg)  SpO2 98% Physical Exam  Nursing note and vitals reviewed. Constitutional: He is oriented to person, place, and time. He appears well-developed and well-nourished.  Pt lying comfortably in exam bed, NAD. Pt appears well, non-toxic.  HENT:  Head: Normocephalic and atraumatic.  Eyes: EOM are normal.  Neck: Normal range of motion.  Cardiovascular: Normal rate.   Pulmonary/Chest: Effort normal.  Genitourinary:  multiple white vesicles on the shaft of the penis, non-tender. No active drainage. No penile discharge. No testicular pain or swelling.  Musculoskeletal: Normal range of motion.  Neurological: He is alert and oriented to person, place, and time.  Skin: Skin is warm and dry.  Psychiatric: He has a normal mood and affect. His behavior is normal.    ED Course  Procedures (including critical care time) Medications - No data to display  DIAGNOSTIC STUDIES: Oxygen Saturation is 98% on RA, normal by my interpretation.    COORDINATION OF CARE: 5:14 PM- Discussed treatment plan with pt which includes STD panel. Pt agrees to plan.    Labs Review  Labs Reviewed - No data to display Imaging Review No results found.  EKG Interpretation   None       MDM   1. Penile rash    Pt presenting with rash on penis that has been present for about 1.5 months.  States he has had multiple checks for STDs at [facility] that have been negative and cannot figure out what is causing the bumps.  On exam, there are multiple white, non-tender vesicles on penile shaft.  Due to testing at health clinic, and no active drainage or penile discharge. Pt also denies fever, n/v/d, do not believe another STD panel is needed at this time.  Lesions could be genital  warts. Advised to f/u with PCP and ask for referral to dermatologist for further evaluation and treatment.   I personally performed the services described in this documentation, which was scribed in my presence. The recorded information has been reviewed and is accurate.    Junius Finner, PA-C 11/24/13 2051

## 2013-11-28 NOTE — ED Provider Notes (Signed)
History/physical exam/procedure(s) were performed by non-physician practitioner and as supervising physician I was immediately available for consultation/collaboration. I have reviewed all notes and am in agreement with care and plan.   Dail Meece S Aleila Syverson, MD 11/28/13 0811 

## 2013-11-30 ENCOUNTER — Ambulatory Visit: Payer: Medicare Other | Attending: Internal Medicine | Admitting: Internal Medicine

## 2013-11-30 ENCOUNTER — Encounter: Payer: Self-pay | Admitting: Internal Medicine

## 2013-11-30 VITALS — BP 152/88 | HR 77 | Temp 97.8°F | Resp 17 | Wt 202.6 lb

## 2013-11-30 DIAGNOSIS — R300 Dysuria: Secondary | ICD-10-CM

## 2013-11-30 DIAGNOSIS — R21 Rash and other nonspecific skin eruption: Secondary | ICD-10-CM

## 2013-11-30 DIAGNOSIS — N489 Disorder of penis, unspecified: Secondary | ICD-10-CM

## 2013-11-30 MED ORDER — SULFAMETHOXAZOLE-TMP DS 800-160 MG PO TABS: 1.0000 | ORAL_TABLET | Freq: Two times a day (BID) | ORAL | Status: DC

## 2013-11-30 NOTE — Progress Notes (Signed)
Patient ID: Nathan Norton, male   DOB: [birth year], 32 y.o.   MRN: [id number] MRN: [id number] Name: Nathan Norton  Sex: male Age: 32 y.o. DOB: 1981-08-09  Allergies: Shellfish allergy  Chief Complaint  Patient presents with  . Rash    HPI: Patient is 32 y.o. male who 32 year old male reported to have noticed bumps/rash on his penis for the last 2 months already was treated with acyclovir, his STD workup is negative, recently also reported to have some urinary burning denies any fever chills nausea vomiting.  Past Medical History  Diagnosis Date  . Schizoaffective disorder, bipolar type     History reviewed. No pertinent past surgical history.    Medication List       This list is accurate as of: 11/30/13  4:38 PM.  Always use your most recent med list.               ARIPiprazole 10 MG tablet  Commonly known as:  ABILIFY  Take one table (10mg ) twice a day for schizophrenia.     benztropine 1 MG tablet  Commonly known as:  COGENTIN  Take 1 tablet (1 mg total) by mouth at bedtime. For prevention of side effects.     sulfamethoxazole-trimethoprim 800-160 MG per tablet  Commonly known as:  BACTRIM DS  Take 1 tablet by mouth 2 (two) times daily.        Meds ordered this encounter  Medications  . sulfamethoxazole-trimethoprim (BACTRIM DS) 800-160 MG per tablet    Sig: Take 1 tablet by mouth 2 (two) times daily.    Dispense:  10 tablet    Refill:  0     There is no immunization history on file for this patient.  History  Substance Use Topics  . Smoking status: Current Every Day Smoker -- 1.00 packs/day for 10 years    Types: Cigarettes  . Smokeless tobacco: Not on file  . Alcohol Use: 0.6 oz/week    1 Cans of beer per week     Comment: Drinks 40oz every day     Review of Systems  As noted in HPI  Filed Vitals:   11/30/13 1613  BP: 152/88  Pulse: 77  Temp: 97.8 F (36.6 C)  Resp: 17    Physical Exam  Physical Exam  Cardiovascular: Normal rate  and regular rhythm.   Pulmonary/Chest: Breath sounds normal. No respiratory distress. He has no wheezes. He has no rales.  Genitourinary:  Small papular multiple bumps on the penis non tender no apparent discharge, no lymphadenopathy or penile discharge     CBC    Component Value Date/Time   WBC 6.2 08/25/2013 1226   RBC 4.31 08/25/2013 1226   HGB 13.7 08/25/2013 1226   HCT 41.6 08/25/2013 1226   PLT 333 08/25/2013 1226   MCV 96.5 08/25/2013 1226   LYMPHSABS 1.1 08/25/2013 1226   MONOABS 0.3 08/25/2013 1226   EOSABS 0.2 08/25/2013 1226   BASOSABS 0.0 08/25/2013 1226    CMP     Component Value Date/Time   NA 142 08/25/2013 1226   K 4.3 08/25/2013 1226   CL 103 08/25/2013 1226   CO2 29 08/25/2013 1226   GLUCOSE 85 08/25/2013 1226   BUN 15 08/25/2013 1226   CREATININE 1.32 08/25/2013 1226   CREATININE 1.30 05/21/2013 2045   CALCIUM 9.9 08/25/2013 1226   PROT 7.7 08/25/2013 1226   ALBUMIN 4.3 08/25/2013 1226   AST 35 08/25/2013 1226   ALT 52 08/25/2013 1226  ALKPHOS 64 08/25/2013 1226   BILITOT 0.4 08/25/2013 1226   GFRNONAA 72* 05/21/2013 2045   GFRAA 83* 05/21/2013 2045    No results found for this basename: chol, tri, ldl    No components found with this basename: hga1c    Lab Results  Component Value Date/Time   AST 35 08/25/2013 12:26 PM    Assessment and Plan  Penile rash - Plan STD workup negative for: Ambulatory referral to Dermatology for further evaluation and management.  Dysuria - Plan: sulfamethoxazole-trimethoprim (BACTRIM DS) 800-160 MG per tablet   Return if symptoms worsen or fail to improve.  Doris Cheadle, MD

## 2013-11-30 NOTE — Progress Notes (Signed)
Patient recently seen in the ED for rash to his penis area Has had this rash for almost 2 months -cream he was prescribed not helping Needs referral to dermatology Complains of having pain to his left shoulder blade and pain to his right knee

## 2014-06-10 ENCOUNTER — Encounter (HOSPITAL_COMMUNITY): Payer: Self-pay | Admitting: Emergency Medicine

## 2014-06-10 ENCOUNTER — Emergency Department (HOSPITAL_COMMUNITY)
Admission: EM | Admit: 2014-06-10 | Discharge: 2014-06-10 | Disposition: A | Discharge status: Home / Self Care | Payer: Medicare Other | Attending: Emergency Medicine | Admitting: Emergency Medicine

## 2014-06-10 DIAGNOSIS — M79609 Pain in unspecified limb: Secondary | ICD-10-CM | POA: Diagnosis not present

## 2014-06-10 DIAGNOSIS — F17200 Nicotine dependence, unspecified, uncomplicated: Secondary | ICD-10-CM | POA: Diagnosis not present

## 2014-06-10 DIAGNOSIS — F259 Schizoaffective disorder, unspecified: Secondary | ICD-10-CM | POA: Diagnosis not present

## 2014-06-10 DIAGNOSIS — F1210 Cannabis abuse, uncomplicated: Secondary | ICD-10-CM | POA: Insufficient documentation

## 2014-06-10 DIAGNOSIS — F1410 Cocaine abuse, uncomplicated: Secondary | ICD-10-CM | POA: Diagnosis not present

## 2014-06-10 DIAGNOSIS — Z79899 Other long term (current) drug therapy: Secondary | ICD-10-CM | POA: Insufficient documentation

## 2014-06-10 DIAGNOSIS — M79606 Pain in leg, unspecified: Secondary | ICD-10-CM

## 2014-06-10 DIAGNOSIS — Z008 Encounter for other general examination: Secondary | ICD-10-CM | POA: Diagnosis present

## 2014-06-10 LAB — COMPREHENSIVE METABOLIC PANEL
ALBUMIN: 4.1 g/dL (ref 3.5–5.2)
ALK PHOS: 115 U/L (ref 39–117)
ALT: 33 U/L (ref 0–53)
AST: 47 U/L — AB (ref 0–37)
BILIRUBIN TOTAL: 0.8 mg/dL (ref 0.3–1.2)
BUN: 6 mg/dL (ref 6–23)
CO2: 26 meq/L (ref 19–32)
CREATININE: 1.23 mg/dL (ref 0.50–1.35)
Calcium: 9.7 mg/dL (ref 8.4–10.5)
Chloride: 97 mEq/L (ref 96–112)
GFR calc Af Amer: 89 mL/min — ABNORMAL LOW (ref >90)
GFR calc non Af Amer: 76 mL/min — ABNORMAL LOW (ref >90)
Glucose, Bld: 114 mg/dL — ABNORMAL HIGH (ref 70–99)
POTASSIUM: 3.7 meq/L (ref 3.7–5.3)
SODIUM: 136 meq/L — AB (ref 137–147)
Total Protein: 8.2 g/dL (ref 6.0–8.3)

## 2014-06-10 LAB — CBC
HCT: 42.9 % (ref 39.0–52.0)
Hemoglobin: 14.4 g/dL (ref 13.0–17.0)
MCH: 33.8 pg (ref 26.0–34.0)
MCHC: 33.6 g/dL (ref 30.0–36.0)
MCV: 100.7 fL — ABNORMAL HIGH (ref 78.0–100.0)
PLATELETS: 207 10*3/uL (ref 150–400)
RBC: 4.26 MIL/uL (ref 4.22–5.81)
RDW: 14.7 % (ref 11.5–15.5)
WBC: 8.6 10*3/uL (ref 4.0–10.5)

## 2014-06-10 LAB — RAPID URINE DRUG SCREEN, HOSP PERFORMED
Amphetamines: NOT DETECTED
BENZODIAZEPINES: NOT DETECTED
Barbiturates: NOT DETECTED
Cocaine: POSITIVE — AB
Opiates: NOT DETECTED
TETRAHYDROCANNABINOL: POSITIVE — AB

## 2014-06-10 LAB — ETHANOL: Alcohol, Ethyl (B): <11 mg/dL (ref 0–11)

## 2014-06-10 LAB — ACETAMINOPHEN LEVEL: Acetaminophen (Tylenol), Serum: <15 ug/mL (ref 10–30)

## 2014-06-10 LAB — SALICYLATE LEVEL

## 2014-06-10 MED ORDER — SODIUM CHLORIDE 0.9 % IV BOLUS (SEPSIS): 1000.0000 mL | Freq: Once | INTRAVENOUS | Status: DC

## 2014-06-10 MED ORDER — IBUPROFEN 600 MG PO TABS: 600.0000 mg | ORAL_TABLET | Freq: Four times a day (QID) | ORAL | Status: DC | PRN

## 2014-06-10 MED ORDER — LORAZEPAM 2 MG/ML IJ SOLN: 0.0000 mg | Freq: Four times a day (QID) | INTRAMUSCULAR | Status: DC

## 2014-06-10 MED ORDER — LORAZEPAM 2 MG/ML IJ SOLN: 0.0000 mg | Freq: Two times a day (BID) | INTRAMUSCULAR | Status: DC

## 2014-06-10 MED ORDER — LORAZEPAM 2 MG/ML IJ SOLN: 2.0000 mg | Freq: Once | INTRAMUSCULAR | Status: DC

## 2014-06-10 NOTE — ED Provider Notes (Signed)
Medical screening examination/treatment/procedure(s) were performed by non-physician practitioner and as supervising physician I was immediately available for consultation/collaboration.   EKG Interpretation None       Olivia Mackielga M Kiefer Opheim, MD 06/10/14 [phone]

## 2014-06-10 NOTE — ED Notes (Signed)
Pt refused to wait for his paperwork and refused to sign for his discharge   Pt escorted out of dept and taken home by [employer]

## 2014-06-10 NOTE — ED Notes (Signed)
FNP at bedside.

## 2014-06-10 NOTE — Discharge Instructions (Signed)
Please go some on her in the morning for assessment to have your medications and restart it if you change your mind and want additional help with your psychiatric history please return for further assessment

## 2014-06-10 NOTE — ED Notes (Signed)
Pt states he has been off his psych meds for about 6 mths

## 2014-06-10 NOTE — ED Provider Notes (Signed)
CSN: [id number]     Arrival date & time 06/10/14  0324 History   First MD Initiated Contact with Patient 06/10/14 0357     Chief Complaint  Patient presents with  . Medical Clearance     (Consider location/radiation/quality/duration/timing/severity/associated sxs/prior Treatment) HPI Comments: This is  a patient home  he presents stating he is off his medications for the past 6 months and he feels like somewhat is chasing him he also complains of right anterior leg pain he denies suicidality homicidality he denies taking any medication for his leg pain which has been hurting him for an unknown period of time he is able to ambulate without difficulty  The history is provided by the patient.    Past Medical History  Diagnosis Date  . Schizoaffective disorder, bipolar type    Past Surgical History  Procedure Laterality Date  . Mouth surgery     Family History  Problem Relation Age of Onset  . Hypertension Other   . Diabetes Other   . Hyperlipidemia Other    History  Substance Use Topics  . Smoking status: Current Every Day Smoker -- 1.00 packs/day for 10 years    Types: Cigarettes  . Smokeless tobacco: Not on file  . Alcohol Use: Yes     Comment:      Review of Systems  Constitutional: Negative for fever.  Musculoskeletal: Positive for arthralgias.  Skin: Negative for wound.  Psychiatric/Behavioral: Negative for suicidal ideas, behavioral problems and agitation.  All other systems reviewed and are negative.     Allergies  Shellfish allergy  Home Medications   Prior to Admission medications   Medication Sig Start Date End Date Taking? Authorizing Provider  ARIPiprazole (ABILIFY) 10 MG tablet Take one table (10mg ) twice a day for schizophrenia. 05/25/13   Verne SpurrNeil Mashburn, PA-C  benztropine (COGENTIN) 1 MG tablet Take 1 tablet (1 mg total) by mouth at bedtime. For prevention of side effects. 05/25/13   Verne SpurrNeil Mashburn, PA-C  ibuprofen (ADVIL,MOTRIN) 600 MG tablet Take 1  tablet (600 mg total) by mouth every 6 (six) hours as needed. 06/10/14   Arman FilterGail K Kavaughn Faucett, NP  sulfamethoxazole-trimethoprim (BACTRIM DS) 800-160 MG per tablet Take 1 tablet by mouth 2 (two) times daily. 11/30/13   Doris Cheadleeepak Advani, MD   BP 141/79  Pulse 116  Temp(Src) 98.6 F (37 C) (Oral)  Resp 14  SpO2 97% Physical Exam  Nursing note and vitals reviewed. Constitutional: He is oriented to person, place, and time. He appears well-developed and well-nourished.  HENT:  Head: Normocephalic.  Eyes: Pupils are equal, round, and reactive to light.  Neck: Normal range of motion.  Cardiovascular: Normal rate and regular rhythm.   Pulmonary/Chest: Effort normal.  Musculoskeletal: Normal range of motion. He exhibits no edema.  Neurological: He is alert and oriented to person, place, and time.  Psychiatric: He has a normal mood and affect. His speech is normal and behavior is normal. Thought content is not paranoid. Cognition and memory are normal. He expresses inappropriate judgment. He expresses no suicidal ideation. He expresses no suicidal plans.  This is cooperative interactive answers all questions appropriately    ED Course  Procedures (including critical care time) Labs Review Labs Reviewed  CBC - Abnormal; Notable for the following:    MCV 100.7 (*)    All other components within normal limits  URINE RAPID DRUG SCREEN (HOSP PERFORMED) - Abnormal; Notable for the following:    Cocaine POSITIVE (*)    Tetrahydrocannabinol POSITIVE (*)  All other components within normal limits  ACETAMINOPHEN LEVEL  COMPREHENSIVE METABOLIC PANEL  ETHANOL  SALICYLATE LEVEL    Imaging Review No results found.   EKG Interpretation None      MDM  Patient again  denies suicidality homicidality paranoia he states he feels fine wants to go  home he states he has a home/apartment  Final diagnoses:  Leg pain         Arman FilterGail K Aldean Pipe, NP 06/10/14 0427  Arman FilterGail K Guido Comp, NP 06/10/14 [phone]
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
## 2014-07-30 ENCOUNTER — Encounter (HOSPITAL_COMMUNITY): Payer: Self-pay | Admitting: Emergency Medicine

## 2014-07-30 ENCOUNTER — Emergency Department (HOSPITAL_COMMUNITY)
Admission: EM | Admit: 2014-07-30 | Discharge: 2014-07-31 | Disposition: A | Discharge status: Home / Self Care | Payer: Medicare Other | Attending: Emergency Medicine | Admitting: Emergency Medicine

## 2014-07-30 ENCOUNTER — Emergency Department (HOSPITAL_COMMUNITY): Payer: Medicare Other

## 2014-07-30 DIAGNOSIS — Z79899 Other long term (current) drug therapy: Secondary | ICD-10-CM | POA: Diagnosis not present

## 2014-07-30 DIAGNOSIS — IMO0002 Reserved for concepts with insufficient information to code with codable children: Secondary | ICD-10-CM | POA: Insufficient documentation

## 2014-07-30 DIAGNOSIS — R4182 Altered mental status, unspecified: Secondary | ICD-10-CM | POA: Diagnosis present

## 2014-07-30 DIAGNOSIS — F17200 Nicotine dependence, unspecified, uncomplicated: Secondary | ICD-10-CM | POA: Diagnosis not present

## 2014-07-30 DIAGNOSIS — F259 Schizoaffective disorder, unspecified: Secondary | ICD-10-CM

## 2014-07-30 DIAGNOSIS — Z87820 Personal history of traumatic brain injury: Secondary | ICD-10-CM | POA: Insufficient documentation

## 2014-07-30 HISTORY — DX: Unspecified intracranial injury with loss of consciousness status unknown, initial encounter: S06.9XAA

## 2014-07-30 HISTORY — DX: Unspecified intracranial injury with loss of consciousness of unspecified duration, initial encounter: S06.9X9A

## 2014-07-30 LAB — COMPREHENSIVE METABOLIC PANEL
ALBUMIN: 4 g/dL (ref 3.5–5.2)
ALT: 18 U/L (ref 0–53)
ANION GAP: 17 — AB (ref 5–15)
AST: 26 U/L (ref 0–37)
Alkaline Phosphatase: 100 U/L (ref 39–117)
BUN: 7 mg/dL (ref 6–23)
CALCIUM: 9 mg/dL (ref 8.4–10.5)
CO2: 23 mEq/L (ref 19–32)
Chloride: 100 mEq/L (ref 96–112)
Creatinine, Ser: 1.07 mg/dL (ref 0.50–1.35)
GFR calc non Af Amer: >90 mL/min (ref >90)
GLUCOSE: 82 mg/dL (ref 70–99)
Potassium: 3.6 mEq/L — ABNORMAL LOW (ref 3.7–5.3)
Sodium: 140 mEq/L (ref 137–147)
Total Bilirubin: 0.5 mg/dL (ref 0.3–1.2)
Total Protein: 7.4 g/dL (ref 6.0–8.3)

## 2014-07-30 LAB — URINALYSIS, ROUTINE W REFLEX MICROSCOPIC
Glucose, UA: NEGATIVE mg/dL
HGB URINE DIPSTICK: NEGATIVE
Ketones, ur: NEGATIVE mg/dL
Leukocytes, UA: NEGATIVE
Nitrite: NEGATIVE
PROTEIN: 30 mg/dL — AB
Specific Gravity, Urine: 1.023 (ref 1.005–1.030)
UROBILINOGEN UA: 0.2 mg/dL (ref 0.0–1.0)
pH: 5.5 (ref 5.0–8.0)

## 2014-07-30 LAB — CBC WITH DIFFERENTIAL/PLATELET
Basophils Absolute: 0 10*3/uL (ref 0.0–0.1)
Basophils Relative: 0 % (ref 0–1)
Eosinophils Absolute: 0.1 10*3/uL (ref 0.0–0.7)
Eosinophils Relative: 2 % (ref 0–5)
HEMATOCRIT: 38.5 % — AB (ref 39.0–52.0)
HEMOGLOBIN: 13.2 g/dL (ref 13.0–17.0)
LYMPHS ABS: 0.6 10*3/uL — AB (ref 0.7–4.0)
Lymphocytes Relative: 8 % — ABNORMAL LOW (ref 12–46)
MCH: 33.5 pg (ref 26.0–34.0)
MCHC: 34.3 g/dL (ref 30.0–36.0)
MCV: 97.7 fL (ref 78.0–100.0)
MONOS PCT: 4 % (ref 3–12)
Monocytes Absolute: 0.3 10*3/uL (ref 0.1–1.0)
NEUTROS ABS: 6.7 10*3/uL (ref 1.7–7.7)
NEUTROS PCT: 86 % — AB (ref 43–77)
Platelets: 281 10*3/uL (ref 150–400)
RBC: 3.94 MIL/uL — ABNORMAL LOW (ref 4.22–5.81)
RDW: 14.2 % (ref 11.5–15.5)
WBC: 7.8 10*3/uL (ref 4.0–10.5)

## 2014-07-30 LAB — RAPID URINE DRUG SCREEN, HOSP PERFORMED
Amphetamines: NOT DETECTED
BARBITURATES: NOT DETECTED
Benzodiazepines: NOT DETECTED
COCAINE: NOT DETECTED
Opiates: NOT DETECTED
Tetrahydrocannabinol: POSITIVE — AB

## 2014-07-30 LAB — URINE MICROSCOPIC-ADD ON

## 2014-07-30 LAB — CK: CK TOTAL: 373 U/L — AB (ref 7–232)

## 2014-07-30 LAB — LIPASE, BLOOD: Lipase: 16 U/L (ref 11–59)

## 2014-07-30 LAB — ETHANOL: Alcohol, Ethyl (B): 47 mg/dL — ABNORMAL HIGH (ref 0–11)

## 2014-07-30 MED ORDER — STERILE WATER FOR INJECTION IJ SOLN
INTRAMUSCULAR | Status: AC
  Filled 2014-07-30: qty 10

## 2014-07-30 MED ORDER — ZIPRASIDONE MESYLATE 20 MG IM SOLR
10.0000 mg | Freq: Once | INTRAMUSCULAR | Status: AC
  Administered 2014-07-30: 10 mg via INTRAMUSCULAR
  Filled 2014-07-30: qty 20

## 2014-07-30 MED ORDER — SODIUM CHLORIDE 0.9 % IV BOLUS (SEPSIS): 1000.0000 mL | Freq: Once | INTRAVENOUS | Status: DC
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
## 2014-07-30 NOTE — ED Notes (Signed)
Pt given bag lunch °
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
## 2014-07-31 ENCOUNTER — Encounter (HOSPITAL_COMMUNITY): Payer: Self-pay | Admitting: Emergency Medicine
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
## 2014-07-31 NOTE — Discharge Instructions (Signed)
°Emergency Department Resource Guide °1) Find a Doctor and Pay Out of Pocket °Although you won't have to find out who is covered by your insurance plan, it is a good idea to ask around and get recommendations. You will then need to call the office and see if the doctor you have chosen will accept you as a new patient and what types of options they offer for patients who are self-pay. Some doctors offer discounts or will set up payment plans for their patients who do not have insurance, but you will need to ask so you aren't surprised when you get to your appointment. ° °2) Contact Your Local Health Department °Not all health departments have doctors that can see patients for sick visits, but many do, so it is worth a call to see if yours does. If you don't know where your local health department is, you can check in your phone book. The CDC also has a tool to help you locate your state's health department, and many state websites also have listings of all of their local health departments. ° °3) Find a Walk-in Clinic °If your illness is not likely to be very severe or complicated, you may want to try a walk in clinic. These are popping up all over the country in pharmacies, drugstores, and shopping centers. They're usually staffed by nurse practitioners or physician assistants that have been trained to treat common illnesses and complaints. They're usually fairly quick and inexpensive. However, if you have serious medical issues or chronic medical problems, these are probably not your best option. ° °No Primary Care Doctor: °- Call [facility] Connect at  832-8000 - they can help you locate a primary care doctor that  accepts your insurance, provides certain services, etc. °- Physician Referral Service- 1-800-533-3463 ° °Chronic Pain Problems: °Organization         Address  Phone   Notes  °[facility]  (336) 297-2271 Patients need to be referred by their primary care doctor.  ° °Medication  Assistance: °Organization         Address  Phone   Notes  °[place] [facility] [address] °[place], [place] [address] (336) 641-8030 --Must be a resident of [place] °-- Must have NO insurance coverage whatsoever (no Medicaid/ Medicare, etc.) °-- The pt. MUST have a primary care doctor that directs their care regularly and follows them in the community °  °[facility]  (866) 331-1348   °[employer]  (888) 892-1162   ° °Agencies that provide inexpensive medical care: °Organization         Address  Phone   Notes  °[facility]  (336) 832-8035   °[facility]    (336) 832-7272   °[facility] [address] °[place], [place] [address] (336) 832-4777   °[facility] [address], °[place] (336) 271-4999   °[facility]    (336) 373-0678   °[facility]    (336) 272-1050   °[facility] ° [address], [place] Phone:  (336) 832-4444, Fax:  (336) 832-4440 Hours of Operation:  9 am - 6 pm, M-F.  Also accepts Medicaid/Medicare and self-pay.  °[facility] ° [address], [place] Phone: (336) 832-3150, Fax: (336) 832-3151. Hours of Operation:  8:30 am - 5:30 pm, M-F.  Also accepts Medicaid and self-pay.  °[facility] [place] [address], [place] Phone: (336) 878-6027   °[facility] [address], [place], [place] (336)723-1848, Ext. 123 Mondays & Thursdays: 7-9 AM.  First 15 patients are seen on a first come, first serve basis. °  ° °Medicaid-accepting [place] Providers: ° °Organization         Address  Phone   Notes  °[facility] [address], [place] (336) 641-2100 Also accepts self-pay patients.  °[facility] [address], [place] ° (336) 856-9996   °[facility] [address], [place]  (336) 288-8857   °[facility] [address] [place] [address], [place] (336) 299-7000   °Veita Bland [address], [place]  ° (336) 373-1557 Only accepts [place] Access Medicaid patients after they have their name applied to their card.  ° °Self-Pay (no insurance) in [place]: ° °Organization         Address  Phone   Notes  °Sickle Cell Patients, [facility] [address], [place] (336) 832-1970   °[facility] [address], [place] (336) 832-4400   °[facility] ° [address], [place] (336) 992-4800   °[facility]/Dr. Osei-Bonsu ° [address] [place] [address], [place] or [address], [place] (336) 841-8500 Phone number for both [place] and [place] locations is the same.  °[facility] [address], [place] (336) 299-0000   °[facility] [address] [place] [address], [place] or [address] (336) 852-7530 °(336) 878-2260   °[facility] [address], [place] (336) 350-1642, phone; (336) 294-5005, fax Sees patients 1st and 3rd Saturday of every month.  Must not qualify for public or private insurance (i.e. Medicaid, Medicare, [place] Health Choice, Veterans' Benefits) • Household income should be no more than 200% of the poverty level •The clinic cannot treat you if you are pregnant or think you are pregnant • Sexually transmitted diseases are not treated at the clinic.  ° ° °Dental Care: °Organization         Address  Phone  Notes  °[place] [facility] [address], [place] (336) 641-6152 Accepts children up to age 21 who are enrolled in Medicaid or [place] Health Choice; pregnant women with a Medicaid card; and children who have applied for Medicaid or [place] Health Choice, but were declined, whose parents can pay a reduced fee at time of service.  °[place]  [facility] [place]  [address], [place] (336) 641-7733 Accepts children up to age 21 who are enrolled in Medicaid or [place] Health Choice; pregnant women with a Medicaid card; and children who have applied for Medicaid or [place] Health Choice, but were declined, whose parents can pay a reduced fee at time of service.  °[facility] ° [address], [place] (336) 641-4533 Patients are seen by appointment only. Walk-ins are not accepted. [facility] will see patients 18 years of age and older. °Monday - Tuesday (8am-5pm) °Most Wednesdays (8:30-5pm) °$30 per visit, cash only  °[facility] ° [address], [place] (336) 641-4533 Patients are seen by appointment only. Walk-ins are not accepted. [facility] will see patients 18 years of age and older. °One   Wednesday Evening (Monthly: Volunteer Based).  $30 per visit, cash only  °[facility]  (919) 537-3737 for adults; Children under age 4, call Graduate Pediatric Dentistry at ([phone]) [phone]. Children aged 4-14, please call (919) [phone] to request a pediatric application. ° Dental services are provided in all areas of dental care including fillings, crowns and bridges, complete and partial dentures, implants, gum treatment, root canals, and extractions. Preventive care is also provided. Treatment is provided to both adults and children. °Patients are selected via a lottery and there is often a waiting list. °  °[facility] [address], °[place] ° (336) 763-8833 [email or web address] °  °[facility] [address], [place], [place] (336)723-1848, Ext. 123 Second and Fourth Thursday of each month, opens at 6:30 AM; Clinic ends at 9 AM.  Patients are seen on a first-come first-served basis, and a limited number are seen during each clinic.  ° °[facility] ° [address], [place], [place] (336) 723-7904    Eligibility Requirements °You must have lived in [place], [place], or [place] counties for at least the last three months. °  You cannot be eligible for state or federal sponsored healthcare insurance, including Veterans Administration, Medicaid, or Medicare. °  You generally cannot be eligible for healthcare insurance through your employer.  °  How to apply: °Eligibility screenings are held every Tuesday and Wednesday afternoon from 1:00 pm until 4:00 pm. You do not need an appointment for the interview!  °[facility] [address], [place], [place] 336-631-2330   °[facility]  336-342-8273   °[facility]  336-703-3100   °[facility]  336-570-6415   ° °Behavioral Health Resources in the Community: °Intensive Outpatient Programs °Organization         Address  Phone  Notes  °[facility] [address], [place], [place] 336-878-6098   °[facility] [address], [place], [place] 336-832-9800   °[facility] [address], [place], [place] ° 336-882-2125   °[facility] [address],  °[place], [place] 1-800-853-5163 or 336-641-4981   °Substance Abuse Resources °Organization         Address  Phone  Notes  °[facility]  336-882-2125   °[facility]  336-784-9470   °[facility]  336-285-9073   °[facility]  336-845-3988   °Residential & Outpatient Substance Abuse Program  1-800-659-3381   °Psychological Services °Organization         Address  Phone  Notes  °[facility]  336- 832-9600   °[facility]  336- 378-7881   °[facility] [address], [place] 1-800-853-5163 or 336-641-4981   ° °Mobile Crisis Teams °Organization         Address  Phone  Notes  °[facility], Mobile Crisis Care Unit  1-877-626-1772   °Assertive °[facility] ° [address]  [place], [place] 336-834-9664   °Sharon DeEsch [address] °[place] 336-554-5454   ° °Self-Help/Support Groups °Organization         Address  Phone             Notes  °[facility]  - variety of support groups  336- 373-1402 Call for more information  °Narcotics Anonymous (NA), [facility] [address], °[place]  2 meetings at this location  ° °  Residential Treatment Programs Organization         Address  Phone  Notes  [facility] [address],    [place]  [phone]   [facility]  [address], [place] [phone], [place], [place] [phone]   [facility] [address], [place] [phone] Admissions: 8am-3pm M-F  [facility] [address]    [place], [place] 295-284-1324([phone]   [facility] [address], [place], [place] [phone]   [facility] [address]  [place], [place] [phone]   [facility] Intensive Outpatient [address], [place], [place] [phone]   [facility] ([facility] [address]  [place], [place] [phone] or [phone]   [facility] [address] ([facility]) [address] [place], [place] [phone] Accepts Medicaid  [facility] [address]  [place] [phone]) [phone] Substance Abuse/Addiction Treatment   [facility] Organization         Address  Phone  Notes  [facility]  8176516127(888) 267-612-8371   Angie FavaJulie Brannon, PhD [address], [address] [place], [place]   [phone](336) [phone] or [phone](336) (785) [phone]   [facility]   [address] [place], [place] [phone](336) [phone]   [facility] [address], [place], [place] (410) [phone](336) [phone] Insurance/Medicaid/sponsorship through [facility] [address]                                    [place], [place] [phone](336) [phone] Therapy/tele-psych/case    [facility] [address]   [place], [place] [phone](336) [phone]    Dr. Lolly MustacheArfeen  [phone]) [phone]   [facility]  [employer] [facility] 1) [address], [place] 2) [address], [place] 3)  [address], [place] [phone](336) [phone](336) [phone]  [phone](336) [phone]   [facility] Child Abuse Hot[facility] 684-747-1867(336) [phone] or (928) [phone](336) (870) [phone] (After Hours)       Schizoaffective Disorder Schizoaffective disorder (ScAD) is a mental illness. It causes symptoms that are a mixture of schizophrenia (a psychotic disorder) and an affective (mood) disorder. The schizophrenic symptoms may include delusions, hallucinations, or odd behavior. The mood symptoms may be similar to major depression or bipolar disorder. ScAD may interfere with personal relationships or normal daily activities. People with ScAD are at increased risk for job loss, social isolation,physical health problems, anxiety and substance use disorders, and suicide. ScAD usually occurs in cycles. Periods of severe symptoms are followed by periods of less severe symptoms or improvement. The illness affects men and women equally but usually appears at an earlier age (teenage or early adult years) in men. People who have family members with schizophrenia, bipolar disorder, or ScAD are at higher risk of developing ScAD. SYMPTOMS  At any one time, people with ScAD may have psychotic symptoms only or both psychotic and mood symptoms. The psychotic symptoms include one or more of the following:  Hearing, seeing, or feeling things that are not there (hallucinations).   Having fixed, false beliefs (delusions). The delusions usually are of being attacked, harassed, cheated, persecuted, or conspired against (paranoid delusions).  Speaking in a way that makes no sense to others (disorganized speech). The psychotic symptoms of ScAD may also include confusing or odd behavior or any of the negative symptoms  of  schizophrenia. These include loss of motivation for normal daily activities, such as bathing or grooming, withdrawal from other people, and lack of emotions.  The mood symptoms of ScAD occur more often than not. They resemble major depressive disorder or bipolar mania. Symptoms of major depression include depressed mood and four or more of the following:  Loss of interest in usually pleasurable activities (anhedonia).  Sleeping more or less than normal.  Feeling worthless or excessively guilty.  Lack of energy or motivation.  Trouble concentrating.  Eating more or less than usual.  Thinking a lot about death or suicide. Symptoms of bipolar mania include abnormally elevated or irritable mood and increased energy or activity, plus three or more of the following:   More confidence than normal or feeling that you are able to do anything (grandiosity).  Feeling rested with less sleep than normal.   Being easily distracted.   Talking more than usual or feeling pressured to keep talking.   Feeling that your thoughts are racing.  Engaging in high-risk activities such as buying sprees or foolish business decisions. DIAGNOSIS  ScAD is diagnosed through an assessment by your health care provider. Your health care provider will observe and ask questions about your thoughts, behavior, mood, and ability to function in daily life. Your health care provider may also ask questions about your medical history and use of drugs, including prescription medicines. Your health care provider may also order blood tests and imaging exams. Certain medical conditions and substances can cause symptoms that resemble ScAD. Your health care provider may refer you to a mental health specialist for evaluation.  ScAD is divided into two types. The depressive type is diagnosed if your mood symptoms are limited to major depression. The bipolar type is diagnosed if your mood symptoms are manic or a mixture of manic  and depressive symptoms TREATMENT  ScAD is usually a lifelong illness. Long-term treatment is necessary. The following treatments are available:  Medicine. Different types of medicine are used to treat ScAD. The exact combination depends on the type and severity of your symptoms. Antipsychotic medicine is used to control psychotic symptomssuch as delusions, paranoia, and hallucinations. Mood stabilizers can even the highs and lows of bipolar manic mood swings. Antidepressant medicines are used to treat major depressive symptoms.  Counseling or talk therapy. Individual, group, or family counseling may be helpful in providing education, support, and guidance. Many people with ScAD also benefit from social skills and job skills (vocational) training. A combination of medicine and counseling is usually best for managing the disorder over time. A procedure in which electricity is applied to the brain through the scalp (electroconvulsive therapy) may be used to treat people with severe manic symptoms that do not respond to medicine and counseling. HOME CARE INSTRUCTIONS   Take all your medicine as prescribed.  Check with your health care provider before starting new prescription or over-the-counter medicines.  Keep all follow up appointments with your health care provider. SEEK MEDICAL CARE IF:   If you are not able to take your medicines as prescribed.  If your symptoms get worse. SEEK IMMEDIATE MEDICAL CARE IF:   You have serious thoughts about hurting yourself or others. Document Released: 04/28/2007 Document Revised: 05/02/2014 Document Reviewed: 07/30/2013 [facility] Patient Information 2015 [place], [place]. This information is not intended to replace advice given to you by your health care provider. Make sure you discuss any questions you have with your health care provider.

## 2014-07-31 NOTE — ED Notes (Signed)
telepsych being performed at the bedside

## 2014-07-31 NOTE — Consult Note (Signed)
  TTS Counselor reports pt denied HI/SI/S&S schizophrenia-just needs place to stay.Social Work consult advised.Continue psych FU per [facility]

## 2014-08-01 ENCOUNTER — Encounter (HOSPITAL_COMMUNITY): Payer: Self-pay | Admitting: Emergency Medicine

## 2014-08-01 ENCOUNTER — Emergency Department (HOSPITAL_COMMUNITY)
Admission: EM | Admit: 2014-08-01 | Discharge: 2014-08-02 | Disposition: A | Discharge status: Home / Self Care | Payer: Medicare Other | Attending: Emergency Medicine | Admitting: Emergency Medicine

## 2014-08-01 DIAGNOSIS — F17200 Nicotine dependence, unspecified, uncomplicated: Secondary | ICD-10-CM | POA: Diagnosis not present

## 2014-08-01 DIAGNOSIS — F1210 Cannabis abuse, uncomplicated: Secondary | ICD-10-CM | POA: Insufficient documentation

## 2014-08-01 DIAGNOSIS — Z87820 Personal history of traumatic brain injury: Secondary | ICD-10-CM | POA: Insufficient documentation

## 2014-08-01 DIAGNOSIS — F259 Schizoaffective disorder, unspecified: Secondary | ICD-10-CM | POA: Diagnosis not present

## 2014-08-01 DIAGNOSIS — R443 Hallucinations, unspecified: Secondary | ICD-10-CM | POA: Diagnosis present

## 2014-08-01 DIAGNOSIS — F209 Schizophrenia, unspecified: Secondary | ICD-10-CM

## 2014-08-01 LAB — COMPREHENSIVE METABOLIC PANEL
ALT: 17 U/L (ref 0–53)
AST: 22 U/L (ref 0–37)
Albumin: 3.9 g/dL (ref 3.5–5.2)
Alkaline Phosphatase: 102 U/L (ref 39–117)
Anion gap: 14 (ref 5–15)
BUN: 7 mg/dL (ref 6–23)
CO2: 26 meq/L (ref 19–32)
CREATININE: 0.96 mg/dL (ref 0.50–1.35)
Calcium: 9.6 mg/dL (ref 8.4–10.5)
Chloride: 98 mEq/L (ref 96–112)
GLUCOSE: 102 mg/dL — AB (ref 70–99)
Potassium: 4 mEq/L (ref 3.7–5.3)
Sodium: 138 mEq/L (ref 137–147)
TOTAL PROTEIN: 7.7 g/dL (ref 6.0–8.3)
Total Bilirubin: 0.6 mg/dL (ref 0.3–1.2)

## 2014-08-01 LAB — CBC
HEMATOCRIT: 39.8 % (ref 39.0–52.0)
HEMOGLOBIN: 13.6 g/dL (ref 13.0–17.0)
MCH: 33.3 pg (ref 26.0–34.0)
MCHC: 34.2 g/dL (ref 30.0–36.0)
MCV: 97.5 fL (ref 78.0–100.0)
Platelets: 292 10*3/uL (ref 150–400)
RBC: 4.08 MIL/uL — ABNORMAL LOW (ref 4.22–5.81)
RDW: 14.3 % (ref 11.5–15.5)
WBC: 5.9 10*3/uL (ref 4.0–10.5)

## 2014-08-01 LAB — ACETAMINOPHEN LEVEL: Acetaminophen (Tylenol), Serum: <15 ug/mL (ref 10–30)

## 2014-08-01 LAB — RAPID URINE DRUG SCREEN, HOSP PERFORMED
Amphetamines: NOT DETECTED
BARBITURATES: NOT DETECTED
Benzodiazepines: NOT DETECTED
Cocaine: NOT DETECTED
Opiates: NOT DETECTED
Tetrahydrocannabinol: POSITIVE — AB

## 2014-08-01 LAB — URINE CULTURE

## 2014-08-01 LAB — SALICYLATE LEVEL: Salicylate Lvl: <2 mg/dL — ABNORMAL LOW (ref 2.8–20.0)

## 2014-08-01 LAB — ETHANOL: Alcohol, Ethyl (B): <11 mg/dL (ref 0–11)

## 2014-08-01 MED ORDER — ONDANSETRON HCL 4 MG PO TABS: 4.0000 mg | ORAL_TABLET | Freq: Three times a day (TID) | ORAL | Status: DC | PRN

## 2014-08-01 MED ORDER — ALUM & MAG HYDROXIDE-SIMETH 200-200-20 MG/5ML PO SUSP: 30.0000 mL | ORAL | Status: DC | PRN

## 2014-08-01 MED ORDER — ZOLPIDEM TARTRATE 5 MG PO TABS: 5.0000 mg | ORAL_TABLET | Freq: Every evening | ORAL | Status: DC | PRN

## 2014-08-01 MED ORDER — IBUPROFEN 200 MG PO TABS: 600.0000 mg | ORAL_TABLET | Freq: Three times a day (TID) | ORAL | Status: DC | PRN

## 2014-08-01 MED ORDER — LORAZEPAM 1 MG PO TABS
1.0000 mg | ORAL_TABLET | Freq: Three times a day (TID) | ORAL | Status: DC | PRN
  Administered 2014-08-01: 1 mg via ORAL
  Filled 2014-08-01: qty 1

## 2014-08-01 MED ORDER — ACETAMINOPHEN 325 MG PO TABS: 650.0000 mg | ORAL_TABLET | ORAL | Status: DC | PRN

## 2014-08-01 MED ORDER — NICOTINE 21 MG/24HR TD PT24
21.0000 mg | MEDICATED_PATCH | Freq: Every day | TRANSDERMAL | Status: DC
  Administered 2014-08-01 – 2014-08-02 (×2): 21 mg via TRANSDERMAL
  Filled 2014-08-01 (×2): qty 1

## 2014-08-01 NOTE — ED Notes (Signed)
Pt brought in by [employer], were called out due to pt showing up at daycare having hallucinations of bombs and everyone being his doctor. Pt is voluntary. Pt asking in triage " what happened to me last night?" Pt mumbling hard to understand.
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
## 2014-08-01 NOTE — ED Notes (Signed)
Pt. Is unable to use the restroom at this time, but is aware that we need a urine specimen.  
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
## 2014-08-01 NOTE — Progress Notes (Signed)
Attempted to secure placement and faxed to the following facilities:  [facility]- Dephanie- faxed information [facility]- Inocencio HomesGayle- faxed information [facility]- Albin Fellingarla- faxed information [facility]- Harriett SineNancy- faxed information [facility]- Byrd HesselbachMaria- faxed information [facility]- RelampagoLisha- faxed information [facility]- [facility]- faxed information [facility]- faxed information  At [place]: [facility]- no beds [facility]- April- no beds [facility]- [facility]- no beds [place]- Arline Aspindy- no beds [place]- Huntley DecSara- no beds [facility]- Morrie Sheldonshley- only child beds [facility]- [facility]- no beds [place]- [facility]- no beds [facility]- left message Constance GoltzCannon- Patricia- called back later for possible discharges [facility]- Jonathan- no beds [facility]-Tonya- no beds [facility]- only 1 male adolescent bed Alvia GroveBrynn Marr- Dianna- no beds [facility]- Chales AbrahamsMary Ann- no beds [facility]- Joan- no beds Nadara EatonGaston- Melissa- only child/adolscent beds Turner Danielsowan- left message Margarette Reginold AgentPardee- Lindsay- no beds [facility]- Luke- no beds   Maryelizabeth Rowanressa Lindsea Olivar, MSW, KelloggLCSWA, 08/01/2014 Evening Clinical Social Worker [phone]

## 2014-08-01 NOTE — ED Notes (Signed)
Pt sleeping at present, will continue to monitor for safety.

## 2014-08-01 NOTE — Progress Notes (Signed)
Pt seen and evaluated by Dr. Lucianne MussKumar and this NP. Pt is psychotic and meets inpatient criteria for hospitalization.  Nathan FannyJohn C Emery Binz, FNP-BC    12:21PM   08/01/14

## 2014-08-01 NOTE — Progress Notes (Signed)
Documentation in the epic chart reports that the patient has [facility] ACTT services.  The TTS Counselor Renda Rolls(Najah) will follow up with the ACTT Team.

## 2014-08-01 NOTE — ED Notes (Addendum)
Pt. Has a pair of jeans, white t-shirt, nike tennis shoes, then he has a bag with personal belongings such as a cracked ZTE cell phone, wallet, sun glasses, and a myCharge. Pt. Has one belonging bag in locker 29.

## 2014-08-02 DIAGNOSIS — F259 Schizoaffective disorder, unspecified: Secondary | ICD-10-CM | POA: Diagnosis not present

## 2014-08-02 LAB — CBG MONITORING, ED: Glucose-Capillary: 92 mg/dL (ref 70–99)

## 2014-08-02 NOTE — Consult Note (Signed)
  Psychiatric Specialty Exam: Physical Exam  ROS  Blood pressure 127/73, pulse 97, temperature 99 F (37.2 C), temperature source Oral, resp. rate 18, SpO2 100.00%.There is no weight on file to calculate BMI.  General Appearance: Casual  Eye Contact::  Poor  Speech:  generally clear  Volume:  Decreased  Mood:  Irritable  Affect:  Congruent  Thought Process:  Coherent  Orientation:  Full (Time, Place, and Person)  Thought Content:  Negative  Suicidal Thoughts:  No  Homicidal Thoughts:  No  Memory:  Immediate;   Fair Recent;   Fair Remote;   Fair  Judgement:  Fair  Insight:  Lacking  Psychomotor Activity:  Normal  Concentration:  Fair  Recall:  Good  Akathisia:  Negative  Handed:  Right  AIMS (if indicated):     Assets:  Communication Skills  Sleep:   good  Mr Nathan Norton denies any hallucinations, any suicidal or homicidal thoughts.  Says he has not taken medications for a year. He is homeless.  Says he does not believe there are any bombs around.  Not really interested in talking to Nathan Norton.  Plan is to discharge home to be followed outpatient as he chooses.  He is being referred to an ACT team today as he is unlikely to follow up with mental health on his own.
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
## 2014-08-02 NOTE — Progress Notes (Signed)
Per Dr. Ladona Ridgelaylor patient is psychiatrically stable and will be discharged with outpatient resources. Dr. Ladona Ridgelaylor rescinded the IVC.

## 2014-08-02 NOTE — Progress Notes (Addendum)
CSW spoke with Narda AmberJeff Mckay with [facility] ACTT team who confirms that patient is not currently receiving services with their agency.  CSW was given permission by the patient is complete a referral and fax for follow up. Referral was completed and faxed.     Maryelizabeth Rowanressa Tephanie Escorcia, MSW, TabLCSWA, 08/02/2014 Evening Clinical Social Worker [phone]

## 2014-08-02 NOTE — Consult Note (Signed)
Case discussed, and agree with plan 
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
## 2015-08-11 ENCOUNTER — Emergency Department (INDEPENDENT_AMBULATORY_CARE_PROVIDER_SITE_OTHER)
Admission: EM | Admit: 2015-08-11 | Discharge: 2015-08-11 | Disposition: A | Discharge status: Home / Self Care | Payer: Medicare Other | Source: Home / Self Care | Attending: Family Medicine | Admitting: Family Medicine

## 2015-08-11 ENCOUNTER — Encounter (HOSPITAL_COMMUNITY): Payer: Self-pay | Admitting: *Deleted

## 2015-08-11 DIAGNOSIS — K047 Periapical abscess without sinus: Secondary | ICD-10-CM

## 2015-08-11 MED ORDER — DICLOFENAC POTASSIUM 50 MG PO TABS: 50.0000 mg | ORAL_TABLET | Freq: Three times a day (TID) | ORAL | Status: DC

## 2015-08-11 MED ORDER — CLINDAMYCIN HCL 300 MG PO CAPS: 300.0000 mg | ORAL_CAPSULE | Freq: Three times a day (TID) | ORAL | Status: DC

## 2015-08-11 NOTE — Discharge Instructions (Signed)
Take medicine as prescribed, see your dentist as soon as possible °

## 2015-08-11 NOTE — ED Notes (Signed)
Pt  Reports  Pain  And  Swelling   To  l   Side  Of  His  Face    X  5  Days       Pt  Reports  It  Is  From   A  Wisdom  Tooth  Pt  Is  Sitting  Upright  On the  Exam table   Speaking  In  Complete  sentances
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
## 2015-11-15 ENCOUNTER — Ambulatory Visit: Payer: Self-pay | Admitting: Family Medicine

## 2015-12-07 ENCOUNTER — Ambulatory Visit: Payer: Self-pay | Admitting: Family Medicine

## 2016-04-03 ENCOUNTER — Emergency Department (HOSPITAL_COMMUNITY)
Admission: EM | Admit: 2016-04-03 | Discharge: 2016-04-04 | Disposition: A | Discharge status: Home / Self Care | Payer: Medicare Other | Attending: Emergency Medicine | Admitting: Emergency Medicine

## 2016-04-03 ENCOUNTER — Encounter (HOSPITAL_COMMUNITY): Payer: Self-pay | Admitting: Emergency Medicine

## 2016-04-03 DIAGNOSIS — R7989 Other specified abnormal findings of blood chemistry: Secondary | ICD-10-CM | POA: Insufficient documentation

## 2016-04-03 DIAGNOSIS — F419 Anxiety disorder, unspecified: Secondary | ICD-10-CM | POA: Insufficient documentation

## 2016-04-03 DIAGNOSIS — F17210 Nicotine dependence, cigarettes, uncomplicated: Secondary | ICD-10-CM | POA: Insufficient documentation

## 2016-04-03 DIAGNOSIS — F22 Delusional disorders: Secondary | ICD-10-CM | POA: Diagnosis not present

## 2016-04-03 DIAGNOSIS — Z008 Encounter for other general examination: Secondary | ICD-10-CM | POA: Diagnosis present

## 2016-04-03 DIAGNOSIS — F250 Schizoaffective disorder, bipolar type: Secondary | ICD-10-CM | POA: Diagnosis present

## 2016-04-03 DIAGNOSIS — R451 Restlessness and agitation: Secondary | ICD-10-CM | POA: Diagnosis not present

## 2016-04-03 DIAGNOSIS — Z87820 Personal history of traumatic brain injury: Secondary | ICD-10-CM | POA: Diagnosis not present

## 2016-04-03 MED ORDER — ACETAMINOPHEN 325 MG PO TABS
650.0000 mg | ORAL_TABLET | ORAL | Status: DC | PRN
  Administered 2016-04-04: 650 mg via ORAL
  Filled 2016-04-03: qty 2

## 2016-04-03 MED ORDER — LORAZEPAM 1 MG PO TABS
1.0000 mg | ORAL_TABLET | Freq: Three times a day (TID) | ORAL | Status: DC | PRN
  Administered 2016-04-03 – 2016-04-04 (×2): 1 mg via ORAL
  Filled 2016-04-03 (×2): qty 1

## 2016-04-03 MED ORDER — NICOTINE 21 MG/24HR TD PT24
21.0000 mg | MEDICATED_PATCH | Freq: Every day | TRANSDERMAL | Status: DC
  Administered 2016-04-04 (×2): 21 mg via TRANSDERMAL
  Filled 2016-04-03 (×2): qty 1

## 2016-04-03 NOTE — ED Notes (Signed)
Pt was brought in to the hospital by family friends that states he was in the street talking to himself  They states he is having hallucinations and not making sense in conversation    Pt unable to carry on a conversation with this recorder  Pt states he needs medicine for his schizophrenia  Pt states he has not had any for "a long time"
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
## 2016-04-03 NOTE — ED Notes (Signed)
2 unsuccessful attempts to draw blood. Pt jumps back every time I tried to stick him resulting in the needle coming out of his arm.  Informed the nurse.

## 2016-04-04 DIAGNOSIS — F250 Schizoaffective disorder, bipolar type: Secondary | ICD-10-CM | POA: Diagnosis not present

## 2016-04-04 LAB — COMPREHENSIVE METABOLIC PANEL
ALBUMIN: 4.2 g/dL (ref 3.5–5.0)
ALK PHOS: 78 U/L (ref 38–126)
ALT: 29 U/L (ref 17–63)
AST: 31 U/L (ref 15–41)
Anion gap: 9 (ref 5–15)
BILIRUBIN TOTAL: 0.7 mg/dL (ref 0.3–1.2)
BUN: 11 mg/dL (ref 6–20)
CO2: 26 mmol/L (ref 22–32)
Calcium: 9.2 mg/dL (ref 8.9–10.3)
Chloride: 101 mmol/L (ref 101–111)
Creatinine, Ser: 1.43 mg/dL — ABNORMAL HIGH (ref 0.61–1.24)
GFR calc Af Amer: >60 mL/min (ref >60)
GFR calc non Af Amer: >60 mL/min (ref >60)
GLUCOSE: 129 mg/dL — AB (ref 65–99)
POTASSIUM: 3.5 mmol/L (ref 3.5–5.1)
Sodium: 136 mmol/L (ref 135–145)
Total Protein: 7.7 g/dL (ref 6.5–8.1)

## 2016-04-04 LAB — CBC WITH DIFFERENTIAL/PLATELET
BASOS ABS: 0.1 10*3/uL (ref 0.0–0.1)
BASOS PCT: 1 %
Eosinophils Absolute: 0.2 10*3/uL (ref 0.0–0.7)
Eosinophils Relative: 2 %
HCT: 39.7 % (ref 39.0–52.0)
Hemoglobin: 13.6 g/dL (ref 13.0–17.0)
Lymphocytes Relative: 9 %
Lymphs Abs: 0.9 10*3/uL (ref 0.7–4.0)
MCH: 31.8 pg (ref 26.0–34.0)
MCHC: 34.3 g/dL (ref 30.0–36.0)
MCV: 92.8 fL (ref 78.0–100.0)
MONOS PCT: 7 %
Monocytes Absolute: 0.7 10*3/uL (ref 0.1–1.0)
NEUTROS ABS: 8.5 10*3/uL — AB (ref 1.7–7.7)
Neutrophils Relative %: 81 %
PLATELETS: 332 10*3/uL (ref 150–400)
RBC: 4.28 MIL/uL (ref 4.22–5.81)
RDW: 14.8 % (ref 11.5–15.5)
WBC: 10.3 10*3/uL (ref 4.0–10.5)

## 2016-04-04 LAB — RAPID URINE DRUG SCREEN, HOSP PERFORMED
Amphetamines: NOT DETECTED
BARBITURATES: NOT DETECTED
Benzodiazepines: NOT DETECTED
COCAINE: NOT DETECTED
OPIATES: NOT DETECTED
Tetrahydrocannabinol: NOT DETECTED

## 2016-04-04 LAB — ETHANOL: Alcohol, Ethyl (B): <5 mg/dL (ref <5)

## 2016-04-04 MED ORDER — HYDROXYZINE HCL 25 MG PO TABS: 25.0000 mg | ORAL_TABLET | Freq: Three times a day (TID) | ORAL | Status: DC | PRN

## 2016-04-04 MED ORDER — CARBAMAZEPINE ER 200 MG PO TB12
200.0000 mg | ORAL_TABLET | Freq: Two times a day (BID) | ORAL | Status: DC
  Administered 2016-04-04: 200 mg via ORAL
  Filled 2016-04-04 (×2): qty 1

## 2016-04-04 MED ORDER — STERILE WATER FOR INJECTION IJ SOLN
INTRAMUSCULAR | Status: AC
  Administered 2016-04-04: 10 mL
  Filled 2016-04-04: qty 10

## 2016-04-04 MED ORDER — OLANZAPINE 10 MG PO TBDP: 10.0000 mg | ORAL_TABLET | Freq: Three times a day (TID) | ORAL | Status: DC | PRN

## 2016-04-04 MED ORDER — ZOLPIDEM TARTRATE 5 MG PO TABS
5.0000 mg | ORAL_TABLET | Freq: Every evening | ORAL | Status: DC | PRN
  Administered 2016-04-04: 5 mg via ORAL
  Filled 2016-04-04: qty 1

## 2016-04-04 MED ORDER — ARIPIPRAZOLE 5 MG PO TABS
5.0000 mg | ORAL_TABLET | Freq: Two times a day (BID) | ORAL | Status: DC
  Administered 2016-04-04: 5 mg via ORAL
  Filled 2016-04-04 (×2): qty 1

## 2016-04-04 MED ORDER — HYDROXYZINE HCL 25 MG PO TABS
25.0000 mg | ORAL_TABLET | Freq: Two times a day (BID) | ORAL | Status: DC
  Administered 2016-04-04: 25 mg via ORAL
  Filled 2016-04-04: qty 1

## 2016-04-04 MED ORDER — ZIPRASIDONE MESYLATE 20 MG IM SOLR
10.0000 mg | Freq: Once | INTRAMUSCULAR | Status: AC
  Administered 2016-04-04: 10 mg via INTRAMUSCULAR
  Filled 2016-04-04: qty 20

## 2016-04-04 MED ORDER — ZIPRASIDONE MESYLATE 20 MG IM SOLR
10.0000 mg | Freq: Once | INTRAMUSCULAR | Status: AC
  Administered 2016-04-04: 10 mg via INTRAMUSCULAR

## 2016-04-04 NOTE — ED Notes (Signed)
Patient alert and oriented to baseline, ambulatory with steady gait, sitting on bed speaking with sitter. Denies further needs, in NAD.

## 2016-04-04 NOTE — BH Assessment (Signed)
[facility] Assessment Progress Note  Per Thedore MinsMojeed Akintayo, MD, this pt requires psychiatric hospitalization.  At 14:35 Christiane HaJonathan [facility].  Pt has been accepted to their facility by [facility] [address] [facility].  Please call report to [phone].  Dahlia ByesJosephine Onuoha, NP concurs with this decision.  Pt's nurse has been notified.  Pt is under IVC and [place] [facility] [facility]  Doylene Canninghomas Glenn Christo, MA Triage Specialist [phone]

## 2016-04-04 NOTE — ED Notes (Addendum)
Pt was brought back from room 17. He is pleasant and cooperative and asked the Clinical research associatewriter for a soft drink. Pt is sitting on his bed./Pt stated, "I am here to get my medicine fixed."[facility]oned to find out about the pt. Johnathan stated he would speak to Tom and phone the writer back in a few minutes. 2:50pm _Phoned [facility] to give report. [facility]. 2:55pm - Phoned sherriff for transportation  [facility].Per sheriff pt will be transported anytime up until 9pm tonight. Pt c/o right sided neck pain. 650 mg of tylenol given and two heat packs. (3pm ) report to oncoming shift (3:20pm)

## 2016-04-04 NOTE — ED Notes (Signed)
No restraints in place during assessment.

## 2016-04-04 NOTE — ED Notes (Signed)
Patient given Malaw[place]urkey sandwich and decaf coffee.
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
## 2016-04-04 NOTE — ED Notes (Signed)
TTS at bedside. 

## 2016-04-04 NOTE — ED Notes (Signed)
[facility].

## 2019-08-13 ENCOUNTER — Emergency Department (HOSPITAL_COMMUNITY)
Admission: EM | Admit: 2019-08-13 | Discharge: 2019-08-14 | Disposition: A | Discharge status: Home / Self Care | Payer: Medicare Other | Attending: Emergency Medicine | Admitting: Emergency Medicine

## 2019-08-13 ENCOUNTER — Encounter (HOSPITAL_COMMUNITY): Payer: Self-pay

## 2019-08-13 ENCOUNTER — Other Ambulatory Visit: Payer: Self-pay

## 2019-08-13 DIAGNOSIS — F10920 Alcohol use, unspecified with intoxication, uncomplicated: Secondary | ICD-10-CM | POA: Diagnosis not present

## 2019-08-13 DIAGNOSIS — F17210 Nicotine dependence, cigarettes, uncomplicated: Secondary | ICD-10-CM | POA: Insufficient documentation

## 2019-08-13 DIAGNOSIS — Y908 Blood alcohol level of 240 mg/100 ml or more: Secondary | ICD-10-CM | POA: Insufficient documentation

## 2019-08-13 DIAGNOSIS — R569 Unspecified convulsions: Secondary | ICD-10-CM | POA: Diagnosis present

## 2019-08-13 LAB — CBC WITH DIFFERENTIAL/PLATELET
Abs Immature Granulocytes: 0.02 10*3/uL (ref 0.00–0.07)
Basophils Absolute: 0 10*3/uL (ref 0.0–0.1)
Basophils Relative: 1 %
Eosinophils Absolute: 0.3 10*3/uL (ref 0.0–0.5)
Eosinophils Relative: 5 %
HCT: 41.7 % (ref 39.0–52.0)
Hemoglobin: 13.6 g/dL (ref 13.0–17.0)
Immature Granulocytes: 0 %
Lymphocytes Relative: 16 %
Lymphs Abs: 0.9 10*3/uL (ref 0.7–4.0)
MCH: 32.5 pg (ref 26.0–34.0)
MCHC: 32.6 g/dL (ref 30.0–36.0)
MCV: 99.5 fL (ref 80.0–100.0)
Monocytes Absolute: 0.3 10*3/uL (ref 0.1–1.0)
Monocytes Relative: 6 %
Neutro Abs: 4 10*3/uL (ref 1.7–7.7)
Neutrophils Relative %: 72 %
Platelets: 243 10*3/uL (ref 150–400)
RBC: 4.19 MIL/uL — ABNORMAL LOW (ref 4.22–5.81)
RDW: 16.4 % — ABNORMAL HIGH (ref 11.5–15.5)
WBC: 5.6 10*3/uL (ref 4.0–10.5)
nRBC: 0 % (ref 0.0–0.2)

## 2019-08-13 LAB — CBG MONITORING, ED: Glucose-Capillary: 124 mg/dL — ABNORMAL HIGH (ref 70–99)
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
## 2019-08-13 NOTE — ED Triage Notes (Signed)
Pt arrived by EMS. Was found walking out of a gas station, collapsed, and had seizure-like activity. EMS stated patient "looked post-ictal" and was nonresponsive when they arrived. Pt has hx of seizures, and alcohol abuse. Pt is now alert, appears to be drunk.

## 2019-08-14 ENCOUNTER — Other Ambulatory Visit (HOSPITAL_COMMUNITY): Payer: Medicare Other

## 2019-08-14 ENCOUNTER — Emergency Department (HOSPITAL_COMMUNITY): Payer: Medicare Other

## 2019-08-14 DIAGNOSIS — R569 Unspecified convulsions: Secondary | ICD-10-CM | POA: Diagnosis not present

## 2019-08-14 LAB — BASIC METABOLIC PANEL WITH GFR
Anion gap: 11 (ref 5–15)
BUN: 10 mg/dL (ref 6–20)
CO2: 27 mmol/L (ref 22–32)
Calcium: 8.8 mg/dL — ABNORMAL LOW (ref 8.9–10.3)
Chloride: 99 mmol/L (ref 98–111)
Creatinine, Ser: 1.24 mg/dL (ref 0.61–1.24)
GFR calc Af Amer: >60 mL/min
GFR calc non Af Amer: >60 mL/min
Glucose, Bld: 98 mg/dL (ref 70–99)
Potassium: 3.5 mmol/L (ref 3.5–5.1)
Sodium: 137 mmol/L (ref 135–145)

## 2019-08-14 LAB — TROPONIN I (HIGH SENSITIVITY): Troponin I (High Sensitivity): 4 ng/L

## 2019-08-14 LAB — CK: Total CK: 297 U/L (ref 49–397)

## 2019-08-14 LAB — ETHANOL: Alcohol, Ethyl (B): 280 mg/dL — ABNORMAL HIGH

## 2019-08-14 NOTE — Discharge Instructions (Signed)
Please be aware you may have another seizure ° °Do not drive until seen by your physician for your condition ° °Do not climb ladders/roofs/trees as a seizure can occur at that height and cause serious harm ° °Do not bathe/swim alone as a seizure can occur and cause serious harm ° °Please followup with your physician or neurologist for further testing and possible treatment ° ° °

## 2020-09-21 ENCOUNTER — Other Ambulatory Visit: Payer: Self-pay

## 2020-09-21 ENCOUNTER — Emergency Department (HOSPITAL_COMMUNITY)
Admission: EM | Admit: 2020-09-21 | Discharge: 2020-09-21 | Disposition: A | Discharge status: Home / Self Care | Payer: Medicare (Managed Care) | Attending: Emergency Medicine | Admitting: Emergency Medicine

## 2020-09-21 ENCOUNTER — Encounter (HOSPITAL_COMMUNITY): Payer: Self-pay | Admitting: Emergency Medicine

## 2020-09-21 DIAGNOSIS — M545 Low back pain: Secondary | ICD-10-CM | POA: Diagnosis present

## 2020-09-21 DIAGNOSIS — Z5321 Procedure and treatment not carried out due to patient leaving prior to being seen by health care provider: Secondary | ICD-10-CM | POA: Diagnosis not present

## 2020-09-21 DIAGNOSIS — M542 Cervicalgia: Secondary | ICD-10-CM | POA: Diagnosis not present

## 2020-09-21 NOTE — ED Notes (Signed)
Patient decided to leave he stated he couldn't wait no longer
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
## 2021-02-14 ENCOUNTER — Emergency Department (HOSPITAL_COMMUNITY)
Admission: EM | Admit: 2021-02-14 | Discharge: 2021-02-14 | Disposition: A | Discharge status: Home / Self Care | Payer: Medicare (Managed Care) | Attending: Emergency Medicine | Admitting: Emergency Medicine

## 2021-02-14 DIAGNOSIS — F1010 Alcohol abuse, uncomplicated: Secondary | ICD-10-CM | POA: Diagnosis not present

## 2021-02-14 DIAGNOSIS — R69 Illness, unspecified: Secondary | ICD-10-CM | POA: Diagnosis present

## 2021-02-14 DIAGNOSIS — F17210 Nicotine dependence, cigarettes, uncomplicated: Secondary | ICD-10-CM | POA: Diagnosis not present

## 2021-02-14 LAB — COMPREHENSIVE METABOLIC PANEL
ALT: 38 U/L (ref 0–44)
AST: 41 U/L (ref 15–41)
Albumin: 4.4 g/dL (ref 3.5–5.0)
Alkaline Phosphatase: 70 U/L (ref 38–126)
Anion gap: 10 (ref 5–15)
BUN: 10 mg/dL (ref 6–20)
CO2: 26 mmol/L (ref 22–32)
Calcium: 9.1 mg/dL (ref 8.9–10.3)
Chloride: 100 mmol/L (ref 98–111)
Creatinine, Ser: 1.14 mg/dL (ref 0.61–1.24)
GFR, Estimated: >60 mL/min (ref >60)
Glucose, Bld: 107 mg/dL — ABNORMAL HIGH (ref 70–99)
Potassium: 4 mmol/L (ref 3.5–5.1)
Sodium: 136 mmol/L (ref 135–145)
Total Bilirubin: 1.6 mg/dL — ABNORMAL HIGH (ref 0.3–1.2)
Total Protein: 8.2 g/dL — ABNORMAL HIGH (ref 6.5–8.1)

## 2021-02-14 LAB — CBC
HCT: 43.8 % (ref 39.0–52.0)
Hemoglobin: 14.2 g/dL (ref 13.0–17.0)
MCH: 32 pg (ref 26.0–34.0)
MCHC: 32.4 g/dL (ref 30.0–36.0)
MCV: 98.6 fL (ref 80.0–100.0)
Platelets: 293 10*3/uL (ref 150–400)
RBC: 4.44 MIL/uL (ref 4.22–5.81)
RDW: 15.3 % (ref 11.5–15.5)
WBC: 6.5 10*3/uL (ref 4.0–10.5)
nRBC: 0 % (ref 0.0–0.2)

## 2021-02-14 LAB — ETHANOL: Alcohol, Ethyl (B): 275 mg/dL — ABNORMAL HIGH (ref <10)

## 2021-02-14 NOTE — ED Notes (Signed)
Pt walking around hall and is appropriate at this time

## 2021-02-14 NOTE — ED Triage Notes (Signed)
Pt presents w multiple complaints including back pain, numbness in 3 toes on L foot, middle finger on L hand darker than others. Pt is poor historian, appears to be intoxicated during triage. NAD noted in triage, pt ambulatory

## 2021-02-14 NOTE — ED Notes (Signed)
Pt refused discharged vitals.

## 2021-02-14 NOTE — ED Notes (Signed)
Pt given lunch bag and drink
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
## 2021-02-18 ENCOUNTER — Emergency Department (HOSPITAL_COMMUNITY)
Admission: EM | Admit: 2021-02-18 | Discharge: 2021-02-19 | Disposition: A | Discharge status: Home / Self Care | Payer: Medicare (Managed Care) | Attending: Emergency Medicine | Admitting: Emergency Medicine

## 2021-02-18 ENCOUNTER — Other Ambulatory Visit: Payer: Self-pay

## 2021-02-18 ENCOUNTER — Encounter (HOSPITAL_COMMUNITY): Payer: Self-pay | Admitting: Emergency Medicine

## 2021-02-18 DIAGNOSIS — F17210 Nicotine dependence, cigarettes, uncomplicated: Secondary | ICD-10-CM | POA: Diagnosis not present

## 2021-02-18 DIAGNOSIS — F1099 Alcohol use, unspecified with unspecified alcohol-induced disorder: Secondary | ICD-10-CM | POA: Insufficient documentation

## 2021-02-18 DIAGNOSIS — F10920 Alcohol use, unspecified with intoxication, uncomplicated: Secondary | ICD-10-CM

## 2021-02-18 DIAGNOSIS — S90821A Blister (nonthermal), right foot, initial encounter: Secondary | ICD-10-CM | POA: Insufficient documentation

## 2021-02-18 DIAGNOSIS — Y906 Blood alcohol level of 120-199 mg/100 ml: Secondary | ICD-10-CM | POA: Insufficient documentation

## 2021-02-18 DIAGNOSIS — S90822A Blister (nonthermal), left foot, initial encounter: Secondary | ICD-10-CM | POA: Diagnosis not present

## 2021-02-18 DIAGNOSIS — S99921A Unspecified injury of right foot, initial encounter: Secondary | ICD-10-CM | POA: Diagnosis present

## 2021-02-18 DIAGNOSIS — X58XXXA Exposure to other specified factors, initial encounter: Secondary | ICD-10-CM | POA: Insufficient documentation

## 2021-02-18 DIAGNOSIS — L84 Corns and callosities: Secondary | ICD-10-CM

## 2021-02-18 LAB — CBC
HCT: 36.9 % — ABNORMAL LOW (ref 39.0–52.0)
Hemoglobin: 12.8 g/dL — ABNORMAL LOW (ref 13.0–17.0)
MCH: 33.7 pg (ref 26.0–34.0)
MCHC: 34.7 g/dL (ref 30.0–36.0)
MCV: 97.1 fL (ref 80.0–100.0)
Platelets: 309 10*3/uL (ref 150–400)
RBC: 3.8 MIL/uL — ABNORMAL LOW (ref 4.22–5.81)
RDW: 15.3 % (ref 11.5–15.5)
WBC: 5.2 10*3/uL (ref 4.0–10.5)
nRBC: 0 % (ref 0.0–0.2)

## 2021-02-18 LAB — COMPREHENSIVE METABOLIC PANEL
ALT: 20 U/L (ref 0–44)
AST: 22 U/L (ref 15–41)
Albumin: 3.8 g/dL (ref 3.5–5.0)
Alkaline Phosphatase: 64 U/L (ref 38–126)
Anion gap: 10 (ref 5–15)
BUN: 6 mg/dL (ref 6–20)
CO2: 24 mmol/L (ref 22–32)
Calcium: 8.8 mg/dL — ABNORMAL LOW (ref 8.9–10.3)
Chloride: 103 mmol/L (ref 98–111)
Creatinine, Ser: 1.01 mg/dL (ref 0.61–1.24)
GFR, Estimated: >60 mL/min (ref >60)
Glucose, Bld: 87 mg/dL (ref 70–99)
Potassium: 3.5 mmol/L (ref 3.5–5.1)
Sodium: 137 mmol/L (ref 135–145)
Total Bilirubin: 0.9 mg/dL (ref 0.3–1.2)
Total Protein: 7.1 g/dL (ref 6.5–8.1)

## 2021-02-18 LAB — ETHANOL: Alcohol, Ethyl (B): 152 mg/dL — ABNORMAL HIGH (ref <10)

## 2021-02-18 NOTE — ED Triage Notes (Addendum)
Pt was lying in the bathroom at [employer].  [employer] called and pt requested EMS to bring him to hospital for detox from ETOH.  Last ETOH just prior to arrival.  On arrival pt reports pain to bilateral feet.  States "they feel like popcorn."

## 2021-02-18 NOTE — ED Provider Notes (Signed)
Nathan Norton is a 40 y.o. male history of schizoaffective disorder, TBI, HSV, alcohol use  On my initial evaluation patient is sleeping soundly in bed no acute distress he is arousable to voice. He reports that he would like to be left alone and given some warm blankets. At further prompting the patient reports that he is homeless which is why he called EMS today, he reports that he had been drinking alcohol today. I asked patient about his triage complaint of foot pain, he reports that he gets blisters on his feet and needs new socks, he reports these blisters are mild constant worse with ambulation and have improved since lying in the bed.  Patient denies fall/injury, fever/chills, headache, vomiting, abdominal pain or any additional concerns at this time. Level five caveat EtOH use Physical Exam  BP 120/82 (BP Location: Left Arm)   Pulse 84   Temp 97.8 F (36.6 C) (Oral)   Resp 18   SpO2 100%   Physical Exam Vitals and nursing note reviewed.  Constitutional:      Appearance: He is well-developed.     Comments: Resting comfortably in no acute distress on arrival to the room.  HENT:     Head: Normocephalic.  Eyes:     Conjunctiva/sclera: Conjunctivae normal.  Cardiovascular:     Rate and Rhythm: Normal rate.     Pulses: Normal pulses.  Pulmonary:     Effort: Pulmonary effort is normal.  Abdominal:     General: There is no distension.     Palpations: Abdomen is soft.  Musculoskeletal:     Cervical back: Neck supple.  Skin:    General: Skin is warm and dry.     Comments: Calluses noted to the bilateral feet.  No ulcerations.  No red streaking, warmth.  Neurological:     Comments: Speech is not slurred.     ED Course/Procedures     Procedures  MDM   40 year old male received at signout from [facility] pending clinical reevaluation.  Please see his note for further work-up and medical decision making.  EtOH elevated at 152.  This was collected 5 hours ago and patient  is now clinically sober.  RN reports that patient is ambulated multiple times in the emergency department.  He has been eating and drinking without difficulty.  On my evaluation, he appears clinically sober.  I reexamined his feet and did see multiple calluses, but no evidence of ulcerations, cellulitis, or abscesses.  Home wound care instructions discussed.  He has been given a referral to Heide Spark at the [facility] for follow-up.  ER return precautions given.  He is hemodynamically stable and in no acute distress.  Safe for discharge with outpatient follow-up as indicated.       Barkley Boards, PA-C 02/19/21 0020    Dione Booze, MD 02/19/21 [phone]
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
## 2021-02-18 NOTE — ED Notes (Signed)
Patient provided with snack pack and cup of water. Patient ignoring RN at this time and refusing to get out of the bed.

## 2021-02-18 NOTE — ED Notes (Signed)
Patient refusing vital signs at this time.

## 2021-02-19 NOTE — Discharge Instructions (Signed)
Thank you for allowing me to care for you today in the Emergency Department.   You can follow-up with Nathan Norton at the [facility] if you continue to have foot pain.  Tylenol and Motrin are available over-the-counter to help with pain.  If you start to have thick, mucus-like drainage from any wounds on your feet, fevers, red streaking, you need to have your feet reevaluated for concern for infection.

## 2021-02-19 NOTE — ED Notes (Signed)
Patient provided with new sock and additional snack pack and drink

## 2021-02-28 ENCOUNTER — Encounter (HOSPITAL_COMMUNITY): Payer: Self-pay

## 2021-02-28 ENCOUNTER — Emergency Department (HOSPITAL_COMMUNITY)
Admission: EM | Admit: 2021-02-28 | Discharge: 2021-02-28 | Disposition: A | Discharge status: Home / Self Care | Payer: Medicare (Managed Care) | Attending: Emergency Medicine | Admitting: Emergency Medicine

## 2021-02-28 ENCOUNTER — Other Ambulatory Visit: Payer: Self-pay

## 2021-02-28 DIAGNOSIS — S0990XA Unspecified injury of head, initial encounter: Secondary | ICD-10-CM | POA: Diagnosis present

## 2021-02-28 DIAGNOSIS — S0101XA Laceration without foreign body of scalp, initial encounter: Secondary | ICD-10-CM | POA: Insufficient documentation

## 2021-02-28 DIAGNOSIS — Y92143 Cell of prison as the place of occurrence of the external cause: Secondary | ICD-10-CM | POA: Diagnosis not present

## 2021-02-28 DIAGNOSIS — W228XXA Striking against or struck by other objects, initial encounter: Secondary | ICD-10-CM | POA: Insufficient documentation

## 2021-02-28 DIAGNOSIS — Z23 Encounter for immunization: Secondary | ICD-10-CM | POA: Diagnosis not present

## 2021-02-28 DIAGNOSIS — F17210 Nicotine dependence, cigarettes, uncomplicated: Secondary | ICD-10-CM | POA: Insufficient documentation

## 2021-02-28 MED ORDER — HYDROGEN PEROXIDE 3 % EX SOLN
Freq: Every day | CUTANEOUS | Status: DC | PRN
  Filled 2021-02-28: qty 473

## 2021-02-28 MED ORDER — TETANUS-DIPHTH-ACELL PERTUSSIS 5-2.5-18.5 LF-MCG/0.5 IM SUSY
0.5000 mL | PREFILLED_SYRINGE | Freq: Once | INTRAMUSCULAR | Status: AC
  Administered 2021-02-28: 0.5 mL via INTRAMUSCULAR
  Filled 2021-02-28: qty 0.5
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
## 2021-03-04 ENCOUNTER — Emergency Department (HOSPITAL_COMMUNITY)
Admission: EM | Admit: 2021-03-04 | Discharge: 2021-03-05 | Disposition: A | Discharge status: Home / Self Care | Payer: Medicare (Managed Care) | Attending: Emergency Medicine | Admitting: Emergency Medicine

## 2021-03-04 DIAGNOSIS — M549 Dorsalgia, unspecified: Secondary | ICD-10-CM | POA: Insufficient documentation

## 2021-03-04 DIAGNOSIS — F1099 Alcohol use, unspecified with unspecified alcohol-induced disorder: Secondary | ICD-10-CM | POA: Insufficient documentation

## 2021-03-04 DIAGNOSIS — M79676 Pain in unspecified toe(s): Secondary | ICD-10-CM | POA: Diagnosis not present

## 2021-03-04 DIAGNOSIS — Z789 Other specified health status: Secondary | ICD-10-CM

## 2021-03-04 DIAGNOSIS — F17210 Nicotine dependence, cigarettes, uncomplicated: Secondary | ICD-10-CM | POA: Insufficient documentation

## 2021-03-04 DIAGNOSIS — Z7289 Other problems related to lifestyle: Secondary | ICD-10-CM

## 2021-03-04 NOTE — ED Triage Notes (Signed)
Pt presents via [facility], picked up from gas station. C/o foot and back pain but denies injury. Reports drinking 'a lot' of four loko's

## 2021-03-05 ENCOUNTER — Other Ambulatory Visit: Payer: Self-pay

## 2021-03-05 ENCOUNTER — Encounter (HOSPITAL_COMMUNITY): Payer: Self-pay

## 2021-03-05 NOTE — ED Notes (Signed)
Pt given new socks, water and crackers. Pt verbalized understanding of discharge instructions and care at home.
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
## 2021-03-05 NOTE — Discharge Instructions (Signed)
Please follow-up with resources within the next 1 to 3 days.  Return to the ER for any new or worsening symptoms or any other concerns.

## 2021-03-11 ENCOUNTER — Emergency Department (HOSPITAL_COMMUNITY)
Admission: EM | Admit: 2021-03-11 | Discharge: 2021-03-11 | Disposition: A | Discharge status: Home / Self Care | Payer: Medicare (Managed Care) | Attending: Emergency Medicine | Admitting: Emergency Medicine

## 2021-03-11 ENCOUNTER — Encounter (HOSPITAL_COMMUNITY): Payer: Self-pay

## 2021-03-11 ENCOUNTER — Other Ambulatory Visit: Payer: Self-pay

## 2021-03-11 DIAGNOSIS — F1020 Alcohol dependence, uncomplicated: Secondary | ICD-10-CM | POA: Insufficient documentation

## 2021-03-11 DIAGNOSIS — F17210 Nicotine dependence, cigarettes, uncomplicated: Secondary | ICD-10-CM | POA: Insufficient documentation

## 2021-03-11 DIAGNOSIS — F10929 Alcohol use, unspecified with intoxication, unspecified: Secondary | ICD-10-CM | POA: Diagnosis present

## 2021-03-11 MED ORDER — ACETAMINOPHEN 500 MG PO TABS
500.0000 mg | ORAL_TABLET | Freq: Once | ORAL | Status: AC
  Administered 2021-03-11: 500 mg via ORAL
  Filled 2021-03-11: qty 1
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
## 2021-03-11 NOTE — ED Triage Notes (Signed)
Pt BIB EMS from walking the street. Pt c/o chronic foot and back pain denies any injury. Pt is homeless and intoxicated.

## 2021-03-11 NOTE — Discharge Instructions (Addendum)
Please review the attachment on local resources available to assist you with your alcohol use disorder.    There is also an attachment with information for local shelters.  Continue to change your socks regularly and watch for any signs of infection on your feet.  Return to the ED or seek immediate medical attention should you experience any new or worsening symptoms.

## 2021-03-17 ENCOUNTER — Emergency Department (HOSPITAL_COMMUNITY)
Admission: EM | Admit: 2021-03-17 | Discharge: 2021-03-17 | Discharge status: Against Medical Advice | Payer: Medicare (Managed Care) | Attending: Emergency Medicine | Admitting: Emergency Medicine

## 2021-03-17 ENCOUNTER — Other Ambulatory Visit: Payer: Self-pay

## 2021-03-17 ENCOUNTER — Encounter (HOSPITAL_COMMUNITY): Payer: Self-pay | Admitting: Emergency Medicine

## 2021-03-17 DIAGNOSIS — F10129 Alcohol abuse with intoxication, unspecified: Secondary | ICD-10-CM | POA: Diagnosis not present

## 2021-03-17 DIAGNOSIS — Z5321 Procedure and treatment not carried out due to patient leaving prior to being seen by health care provider: Secondary | ICD-10-CM | POA: Diagnosis not present

## 2021-03-17 LAB — CBC WITH DIFFERENTIAL/PLATELET
Abs Immature Granulocytes: 0.04 10*3/uL (ref 0.00–0.07)
Basophils Absolute: 0.1 10*3/uL (ref 0.0–0.1)
Basophils Relative: 1 %
Eosinophils Absolute: 0.5 10*3/uL (ref 0.0–0.5)
Eosinophils Relative: 8 %
HCT: 39.2 % (ref 39.0–52.0)
Hemoglobin: 13.3 g/dL (ref 13.0–17.0)
Immature Granulocytes: 1 %
Lymphocytes Relative: 17 %
Lymphs Abs: 1 10*3/uL (ref 0.7–4.0)
MCH: 33.4 pg (ref 26.0–34.0)
MCHC: 33.9 g/dL (ref 30.0–36.0)
MCV: 98.5 fL (ref 80.0–100.0)
Monocytes Absolute: 0.4 10*3/uL (ref 0.1–1.0)
Monocytes Relative: 8 %
Neutro Abs: 3.6 10*3/uL (ref 1.7–7.7)
Neutrophils Relative %: 65 %
Platelets: 289 10*3/uL (ref 150–400)
RBC: 3.98 MIL/uL — ABNORMAL LOW (ref 4.22–5.81)
RDW: 16.6 % — ABNORMAL HIGH (ref 11.5–15.5)
WBC: 5.6 10*3/uL (ref 4.0–10.5)
nRBC: 0 % (ref 0.0–0.2)

## 2021-03-17 LAB — BASIC METABOLIC PANEL
Anion gap: 10 (ref 5–15)
BUN: 9 mg/dL (ref 6–20)
CO2: 26 mmol/L (ref 22–32)
Calcium: 8.9 mg/dL (ref 8.9–10.3)
Chloride: 99 mmol/L (ref 98–111)
Creatinine, Ser: 1.16 mg/dL (ref 0.61–1.24)
GFR, Estimated: >60 mL/min (ref >60)
Glucose, Bld: 108 mg/dL — ABNORMAL HIGH (ref 70–99)
Potassium: 3.6 mmol/L (ref 3.5–5.1)
Sodium: 135 mmol/L (ref 135–145)

## 2021-03-17 LAB — ETHANOL: Alcohol, Ethyl (B): 187 mg/dL — ABNORMAL HIGH (ref <10)

## 2021-03-17 NOTE — ED Triage Notes (Signed)
Patient arrived with EMS found sleeping inside a gas station's toilet after drinking ETOH , ambulatory , respirations unlabored / denies pain .

## 2021-03-17 NOTE — ED Notes (Signed)
Pt stated he was leaving. 

## 2021-03-18 ENCOUNTER — Emergency Department (HOSPITAL_COMMUNITY)
Admission: EM | Admit: 2021-03-18 | Discharge: 2021-03-19 | Disposition: A | Discharge status: Home / Self Care | Payer: Medicare (Managed Care) | Attending: Emergency Medicine | Admitting: Emergency Medicine

## 2021-03-18 ENCOUNTER — Other Ambulatory Visit: Payer: Self-pay

## 2021-03-18 DIAGNOSIS — F17210 Nicotine dependence, cigarettes, uncomplicated: Secondary | ICD-10-CM | POA: Insufficient documentation

## 2021-03-18 DIAGNOSIS — M79671 Pain in right foot: Secondary | ICD-10-CM | POA: Insufficient documentation

## 2021-03-18 DIAGNOSIS — X58XXXA Exposure to other specified factors, initial encounter: Secondary | ICD-10-CM | POA: Insufficient documentation

## 2021-03-18 DIAGNOSIS — M79672 Pain in left foot: Secondary | ICD-10-CM | POA: Insufficient documentation

## 2021-03-18 DIAGNOSIS — S39012A Strain of muscle, fascia and tendon of lower back, initial encounter: Secondary | ICD-10-CM | POA: Insufficient documentation

## 2021-03-18 DIAGNOSIS — Z79899 Other long term (current) drug therapy: Secondary | ICD-10-CM | POA: Insufficient documentation

## 2021-03-18 DIAGNOSIS — R1013 Epigastric pain: Secondary | ICD-10-CM | POA: Insufficient documentation

## 2021-03-18 LAB — COMPREHENSIVE METABOLIC PANEL
ALT: 35 U/L (ref 0–44)
AST: 31 U/L (ref 15–41)
Albumin: 3.7 g/dL (ref 3.5–5.0)
Alkaline Phosphatase: 76 U/L (ref 38–126)
Anion gap: 8 (ref 5–15)
BUN: 11 mg/dL (ref 6–20)
CO2: 26 mmol/L (ref 22–32)
Calcium: 9.2 mg/dL (ref 8.9–10.3)
Chloride: 104 mmol/L (ref 98–111)
Creatinine, Ser: 1.06 mg/dL (ref 0.61–1.24)
GFR, Estimated: >60 mL/min (ref >60)
Glucose, Bld: 90 mg/dL (ref 70–99)
Potassium: 3.8 mmol/L (ref 3.5–5.1)
Sodium: 138 mmol/L (ref 135–145)
Total Bilirubin: 0.8 mg/dL (ref 0.3–1.2)
Total Protein: 7.2 g/dL (ref 6.5–8.1)

## 2021-03-18 LAB — LIPASE, BLOOD: Lipase: 30 U/L (ref 11–51)

## 2021-03-18 LAB — ETHANOL: Alcohol, Ethyl (B): <10 mg/dL (ref <10)

## 2021-03-18 LAB — CBC
HCT: 40.2 % (ref 39.0–52.0)
Hemoglobin: 13.6 g/dL (ref 13.0–17.0)
MCH: 33.4 pg (ref 26.0–34.0)
MCHC: 33.8 g/dL (ref 30.0–36.0)
MCV: 98.8 fL (ref 80.0–100.0)
Platelets: 342 10*3/uL (ref 150–400)
RBC: 4.07 MIL/uL — ABNORMAL LOW (ref 4.22–5.81)
RDW: 16.5 % — ABNORMAL HIGH (ref 11.5–15.5)
WBC: 6.6 10*3/uL (ref 4.0–10.5)
nRBC: 0 % (ref 0.0–0.2)

## 2021-03-18 NOTE — ED Triage Notes (Signed)
Pt reports Epigastric mid sternal abd pain denies any chest pain n/v/d. Pt also states he needs foot cream for bilateral blisters on feet. Pt denies any ETOH today

## 2021-03-19 ENCOUNTER — Emergency Department (HOSPITAL_COMMUNITY)
Admission: EM | Admit: 2021-03-19 | Discharge: 2021-03-19 | Disposition: A | Discharge status: Home / Self Care | Payer: Medicare (Managed Care) | Source: Home / Self Care | Attending: Emergency Medicine | Admitting: Emergency Medicine

## 2021-03-19 DIAGNOSIS — X500XXA Overexertion from strenuous movement or load, initial encounter: Secondary | ICD-10-CM | POA: Insufficient documentation

## 2021-03-19 DIAGNOSIS — F17210 Nicotine dependence, cigarettes, uncomplicated: Secondary | ICD-10-CM | POA: Insufficient documentation

## 2021-03-19 DIAGNOSIS — S39012A Strain of muscle, fascia and tendon of lower back, initial encounter: Secondary | ICD-10-CM

## 2021-03-19 DIAGNOSIS — R1013 Epigastric pain: Secondary | ICD-10-CM | POA: Diagnosis not present

## 2021-03-19 LAB — URINALYSIS, ROUTINE W REFLEX MICROSCOPIC
Bilirubin Urine: NEGATIVE
Glucose, UA: NEGATIVE mg/dL
Hgb urine dipstick: NEGATIVE
Ketones, ur: NEGATIVE mg/dL
Leukocytes,Ua: NEGATIVE
Nitrite: NEGATIVE
Protein, ur: NEGATIVE mg/dL
Specific Gravity, Urine: 1.015 (ref 1.005–1.030)
pH: 6 (ref 5.0–8.0)

## 2021-03-19 LAB — RAPID URINE DRUG SCREEN, HOSP PERFORMED
Amphetamines: NOT DETECTED
Barbiturates: NOT DETECTED
Benzodiazepines: NOT DETECTED
Cocaine: NOT DETECTED
Opiates: NOT DETECTED
Tetrahydrocannabinol: NOT DETECTED

## 2021-03-19 MED ORDER — METHOCARBAMOL 500 MG PO TABS: 500.0000 mg | ORAL_TABLET | Freq: Two times a day (BID) | ORAL | 0 refills | Status: DC

## 2021-03-19 MED ORDER — CLOTRIMAZOLE 1 % EX CREA: TOPICAL_CREAM | CUTANEOUS | 0 refills | Status: DC
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
## 2021-03-19 NOTE — Discharge Instructions (Addendum)
Back Pain:  Your back pain should be treated with medicines such as ibuprofen or aleve and this back pain should get better over the next 2 weeks.  However if you develop severe or worsening pain, low back pain with fever, numbness, weakness or inability to walk or urinate, you should return to the ER immediately.  Please follow up with your doctor this week for a recheck if still having symptoms.  Self - care:  The application of heat can help soothe the pain.  Maintaining your daily activities, including walking, is encourged, as it will help you get better faster than just staying in bed.  Medications are also useful to help with pain control.  A commonly prescribed medications includes acetaminophen.  This medication is generally safe, though you should not take more than 8 of the extra strength (500mg ) pills a day.  Non steroidal anti inflammatory medications including Ibuprofen and naproxen;  These medications help both pain and swelling and are very useful in treating back pain.  They should be taken with food, as they can cause stomach upset, and more seriously, stomach bleeding.    Muscle relaxants:  These medications can help with muscle tightness that is a cause of lower back pain.  Most of these medications can cause drowsiness, and it is not safe to drive or use dangerous machinery while taking them.  You will need to follow up with  Your primary healthcare provider in 1-2 weeks for reassessment.  Be aware that if you develop new symptoms, such as a fever, leg weakness, difficulty with or loss of control of your urine or bowels, abdominal pain, or more severe pain, you will need to seek medical attention and  / or return to the Emergency department.  If you do not have a doctor see the list below.  Please speak to your pharmacist about any medications prescribed today in regards to side effects or interactions with other medications.

## 2021-03-19 NOTE — Discharge Instructions (Signed)
I have sent cream for you feet to pharmacy for you. Try to use as directed.  I would keep the dry skin on your heels moisturized best you can to keep it from cracking. Follow-up with your primary care doctor. Return here for new concerns.

## 2021-03-19 NOTE — ED Triage Notes (Signed)
Pt reports discharge . Pt reports lumber back pain. Pt reports he had chronic back pain.Pt is sleeping in waiting room at this time. Pt denies any other problems .
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
## 2021-03-25 ENCOUNTER — Emergency Department (HOSPITAL_BASED_OUTPATIENT_CLINIC_OR_DEPARTMENT_OTHER)
Admission: EM | Admit: 2021-03-25 | Discharge: 2021-03-25 | Disposition: A | Discharge status: Home / Self Care | Payer: Medicare (Managed Care) | Attending: Emergency Medicine | Admitting: Emergency Medicine

## 2021-03-25 ENCOUNTER — Encounter (HOSPITAL_BASED_OUTPATIENT_CLINIC_OR_DEPARTMENT_OTHER): Payer: Self-pay | Admitting: Emergency Medicine

## 2021-03-25 ENCOUNTER — Other Ambulatory Visit: Payer: Self-pay

## 2021-03-25 DIAGNOSIS — F17210 Nicotine dependence, cigarettes, uncomplicated: Secondary | ICD-10-CM | POA: Diagnosis not present

## 2021-03-25 DIAGNOSIS — I10 Essential (primary) hypertension: Secondary | ICD-10-CM | POA: Diagnosis not present

## 2021-03-25 DIAGNOSIS — M792 Neuralgia and neuritis, unspecified: Secondary | ICD-10-CM | POA: Diagnosis not present

## 2021-03-25 DIAGNOSIS — G8929 Other chronic pain: Secondary | ICD-10-CM | POA: Diagnosis not present

## 2021-03-25 DIAGNOSIS — M25532 Pain in left wrist: Secondary | ICD-10-CM | POA: Diagnosis present

## 2021-03-25 DIAGNOSIS — E119 Type 2 diabetes mellitus without complications: Secondary | ICD-10-CM | POA: Insufficient documentation

## 2021-03-25 HISTORY — DX: Type 2 diabetes mellitus without complications: E11.9

## 2021-03-25 HISTORY — DX: Essential (primary) hypertension: I10

## 2021-03-25 MED ORDER — IBUPROFEN 800 MG PO TABS
800.0000 mg | ORAL_TABLET | Freq: Once | ORAL | Status: AC
  Administered 2021-03-25: 800 mg via ORAL
  Filled 2021-03-25: qty 1
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
## 2021-03-25 NOTE — ED Triage Notes (Signed)
BIB [facility] for eval of chronic pain to BIL feet, L wrist and lower back. Pt was picked up at a gas station in [place]. VSS. Glucose in ambulance 167.

## 2021-03-27 ENCOUNTER — Encounter (HOSPITAL_COMMUNITY): Payer: Self-pay | Admitting: Emergency Medicine

## 2021-03-27 ENCOUNTER — Other Ambulatory Visit: Payer: Self-pay

## 2021-03-27 ENCOUNTER — Emergency Department (HOSPITAL_COMMUNITY)
Admission: EM | Admit: 2021-03-27 | Discharge: 2021-03-27 | Disposition: A | Discharge status: Home / Self Care | Payer: Medicare (Managed Care) | Attending: Emergency Medicine | Admitting: Emergency Medicine

## 2021-03-27 DIAGNOSIS — M79672 Pain in left foot: Secondary | ICD-10-CM | POA: Diagnosis not present

## 2021-03-27 DIAGNOSIS — M79671 Pain in right foot: Secondary | ICD-10-CM | POA: Diagnosis not present

## 2021-03-27 DIAGNOSIS — F17210 Nicotine dependence, cigarettes, uncomplicated: Secondary | ICD-10-CM | POA: Diagnosis not present

## 2021-03-27 DIAGNOSIS — I10 Essential (primary) hypertension: Secondary | ICD-10-CM | POA: Diagnosis not present

## 2021-03-27 DIAGNOSIS — R1010 Upper abdominal pain, unspecified: Secondary | ICD-10-CM

## 2021-03-27 DIAGNOSIS — E119 Type 2 diabetes mellitus without complications: Secondary | ICD-10-CM | POA: Insufficient documentation

## 2021-03-27 LAB — CBC
HCT: 40.2 % (ref 39.0–52.0)
Hemoglobin: 13.6 g/dL (ref 13.0–17.0)
MCH: 33.8 pg (ref 26.0–34.0)
MCHC: 33.8 g/dL (ref 30.0–36.0)
MCV: 100 fL (ref 80.0–100.0)
Platelets: 330 10*3/uL (ref 150–400)
RBC: 4.02 MIL/uL — ABNORMAL LOW (ref 4.22–5.81)
RDW: 16.7 % — ABNORMAL HIGH (ref 11.5–15.5)
WBC: 9.3 10*3/uL (ref 4.0–10.5)
nRBC: 0 % (ref 0.0–0.2)

## 2021-03-27 LAB — COMPREHENSIVE METABOLIC PANEL
ALT: 32 U/L (ref 0–44)
AST: 33 U/L (ref 15–41)
Albumin: 3.8 g/dL (ref 3.5–5.0)
Alkaline Phosphatase: 80 U/L (ref 38–126)
Anion gap: 7 (ref 5–15)
BUN: 13 mg/dL (ref 6–20)
CO2: 30 mmol/L (ref 22–32)
Calcium: 9.2 mg/dL (ref 8.9–10.3)
Chloride: 102 mmol/L (ref 98–111)
Creatinine, Ser: 1.18 mg/dL (ref 0.61–1.24)
GFR, Estimated: >60 mL/min (ref >60)
Glucose, Bld: 92 mg/dL (ref 70–99)
Potassium: 4.4 mmol/L (ref 3.5–5.1)
Sodium: 139 mmol/L (ref 135–145)
Total Bilirubin: 0.7 mg/dL (ref 0.3–1.2)
Total Protein: 7.2 g/dL (ref 6.5–8.1)

## 2021-03-27 LAB — LIPASE, BLOOD: Lipase: 34 U/L (ref 11–51)

## 2021-03-27 MED ORDER — ACETAMINOPHEN 500 MG PO TABS
1000.0000 mg | ORAL_TABLET | Freq: Once | ORAL | Status: AC
  Administered 2021-03-27: 1000 mg via ORAL
  Filled 2021-03-27: qty 2

## 2021-03-27 MED ORDER — CLOTRIMAZOLE 1 % EX CREA
TOPICAL_CREAM | Freq: Once | CUTANEOUS | Status: AC
  Filled 2021-03-27: qty 15

## 2021-03-27 NOTE — Discharge Instructions (Signed)
If you develop fever, vomiting, new or worsening pain, weakness, or any other new/concerning symptoms, then return to the ER for evaluation

## 2021-03-27 NOTE — ED Triage Notes (Signed)
Patient arrived with EMS from a gas station reports generalized abdominal pain this week , no emesis or diarrhea , no fever or chills .
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
## 2021-03-28 ENCOUNTER — Encounter (HOSPITAL_COMMUNITY): Payer: Self-pay | Admitting: Emergency Medicine

## 2021-03-28 ENCOUNTER — Emergency Department (HOSPITAL_COMMUNITY)
Admission: EM | Admit: 2021-03-28 | Discharge: 2021-03-28 | Disposition: A | Discharge status: Home / Self Care | Payer: Medicare (Managed Care) | Attending: Emergency Medicine | Admitting: Emergency Medicine

## 2021-03-28 DIAGNOSIS — F17210 Nicotine dependence, cigarettes, uncomplicated: Secondary | ICD-10-CM | POA: Diagnosis not present

## 2021-03-28 DIAGNOSIS — M545 Low back pain, unspecified: Secondary | ICD-10-CM | POA: Diagnosis present

## 2021-03-28 DIAGNOSIS — I10 Essential (primary) hypertension: Secondary | ICD-10-CM | POA: Insufficient documentation

## 2021-03-28 DIAGNOSIS — E119 Type 2 diabetes mellitus without complications: Secondary | ICD-10-CM | POA: Insufficient documentation

## 2021-03-28 MED ORDER — LIDOCAINE 5 % EX PTCH
1.0000 | MEDICATED_PATCH | CUTANEOUS | Status: DC
  Administered 2021-03-28: 1 via TRANSDERMAL
  Filled 2021-03-28: qty 1

## 2021-03-28 MED ORDER — NAPROXEN 250 MG PO TABS
500.0000 mg | ORAL_TABLET | Freq: Once | ORAL | Status: AC
  Administered 2021-03-28: 500 mg via ORAL
  Filled 2021-03-28: qty 2

## 2021-03-28 MED ORDER — LIDOCAINE 5 % EX PTCH: 1.0000 | MEDICATED_PATCH | Freq: Every day | CUTANEOUS | 0 refills | Status: DC | PRN
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
## 2021-03-28 NOTE — Discharge Instructions (Addendum)
You were seen in the emergency department today for back pain.  We suspect your pain is related to muscle strain/spasm.  Please take Ibuprofen and or Tylenol per over-the-counter dosing help with discomfort.  We are sending home with a Lidoderm patch prescription, please apply 1 patch to area with significant pain once per day as needed.  Remove and discard patch within 12 hours.  We have prescribed you new medication(s) today. Discuss the medications prescribed today with your pharmacist as they can have adverse effects and interactions with your other medicines including over the counter and prescribed medications. Seek medical evaluation if you start to experience new or abnormal symptoms after taking one of these medicines, seek care immediately if you start to experience difficulty breathing, feeling of your throat closing, facial swelling, or rash as these could be indications of a more serious allergic reaction  Please follow-up with primary care within 1 week.  Return to the ER for new or worsening symptoms including but not limited to fever, numbness, weakness, loss of control of bowel or bladder function, inability to walk, or any other concerns.

## 2021-03-28 NOTE — ED Triage Notes (Signed)
Pt here with c/o low back muscle spasms

## 2021-03-28 NOTE — ED Notes (Signed)
Patient verbalizes understanding of discharge instructions. Prescriptions reviewed. Opportunity for questioning and answers were provided. Armband removed by staff, pt discharged from ED ambulatory. ° °

## 2021-04-04 ENCOUNTER — Other Ambulatory Visit: Payer: Self-pay

## 2021-04-04 ENCOUNTER — Emergency Department (HOSPITAL_COMMUNITY)
Admission: EM | Admit: 2021-04-04 | Discharge: 2021-04-05 | Disposition: A | Discharge status: Home / Self Care | Payer: Medicare (Managed Care) | Source: Home / Self Care

## 2021-04-04 ENCOUNTER — Emergency Department (HOSPITAL_COMMUNITY)
Admission: EM | Admit: 2021-04-04 | Discharge: 2021-04-04 | Disposition: A | Discharge status: Home / Self Care | Payer: Medicare (Managed Care) | Attending: Emergency Medicine | Admitting: Emergency Medicine

## 2021-04-04 ENCOUNTER — Encounter (HOSPITAL_COMMUNITY): Payer: Self-pay

## 2021-04-04 DIAGNOSIS — L03119 Cellulitis of unspecified part of limb: Secondary | ICD-10-CM

## 2021-04-04 DIAGNOSIS — X58XXXA Exposure to other specified factors, initial encounter: Secondary | ICD-10-CM | POA: Insufficient documentation

## 2021-04-04 DIAGNOSIS — F10129 Alcohol abuse with intoxication, unspecified: Secondary | ICD-10-CM | POA: Insufficient documentation

## 2021-04-04 DIAGNOSIS — E1140 Type 2 diabetes mellitus with diabetic neuropathy, unspecified: Secondary | ICD-10-CM | POA: Diagnosis not present

## 2021-04-04 DIAGNOSIS — M79671 Pain in right foot: Secondary | ICD-10-CM

## 2021-04-04 DIAGNOSIS — I10 Essential (primary) hypertension: Secondary | ICD-10-CM | POA: Diagnosis not present

## 2021-04-04 DIAGNOSIS — S90822A Blister (nonthermal), left foot, initial encounter: Secondary | ICD-10-CM | POA: Insufficient documentation

## 2021-04-04 DIAGNOSIS — F17210 Nicotine dependence, cigarettes, uncomplicated: Secondary | ICD-10-CM | POA: Insufficient documentation

## 2021-04-04 DIAGNOSIS — S99921A Unspecified injury of right foot, initial encounter: Secondary | ICD-10-CM | POA: Diagnosis present

## 2021-04-04 DIAGNOSIS — S90821A Blister (nonthermal), right foot, initial encounter: Secondary | ICD-10-CM | POA: Insufficient documentation

## 2021-04-04 DIAGNOSIS — Z5321 Procedure and treatment not carried out due to patient leaving prior to being seen by health care provider: Secondary | ICD-10-CM | POA: Insufficient documentation

## 2021-04-04 DIAGNOSIS — M79672 Pain in left foot: Secondary | ICD-10-CM

## 2021-04-04 DIAGNOSIS — S90829A Blister (nonthermal), unspecified foot, initial encounter: Secondary | ICD-10-CM

## 2021-04-04 MED ORDER — CEPHALEXIN 500 MG PO CAPS: 500.0000 mg | ORAL_CAPSULE | Freq: Four times a day (QID) | ORAL | 0 refills | Status: DC

## 2021-04-04 MED ORDER — CEPHALEXIN 250 MG PO CAPS
500.0000 mg | ORAL_CAPSULE | Freq: Once | ORAL | Status: AC
  Administered 2021-04-04: 500 mg via ORAL
  Filled 2021-04-04: qty 2
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
## 2021-04-04 NOTE — ED Triage Notes (Signed)
Bilateral foot pain x months due to constantly walking.denies trauma.
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
## 2021-04-04 NOTE — ED Notes (Signed)
Patient provided with two new pairs of socks.

## 2021-04-04 NOTE — ED Notes (Signed)
Patient verbally abusive to staff-trying to smoke in triage

## 2021-04-04 NOTE — ED Provider Notes (Signed)
MSE was initiated and I personally evaluated the patient and placed orders (if any) at  3:34 AM on April 04, 2021.  To ED with pain in both feet "for a long time".  Today's Vitals   04/04/21 0332  BP: 126/87  Pulse: (!) 105  Resp: 16  Temp: 98.6 F (37 C)  TempSrc: Oral  SpO2: 96%   There is no height or weight on file to calculate BMI.  Patient in NAD, VSS.   The patient appears stable so that the remainder of the MSE may be completed by another provider.   Elpidio Anis, PA-C 04/04/21 0335    Geoffery Lyons, MD 04/04/21 [phone]

## 2021-04-04 NOTE — Discharge Instructions (Addendum)
Your exam today is consistent with blisters developing on both of your feet likely due to combination of new shoes and your peripheral neuropathies with diabetes.  As there is some surrounding redness, we are treating for early cellulitis developing from these.  Please take the antibiotics to treat infection and follow-up with the foot team, podiatry.  Please also follow-up with a PCP.  Please rest and stay hydrated.  If any symptoms change or worsen, please return to the nearest emergency department.

## 2021-04-13 ENCOUNTER — Emergency Department (HOSPITAL_COMMUNITY)
Admission: EM | Admit: 2021-04-13 | Discharge: 2021-04-13 | Disposition: A | Discharge status: Home / Self Care | Payer: Medicare (Managed Care) | Attending: Emergency Medicine | Admitting: Emergency Medicine

## 2021-04-13 DIAGNOSIS — F17210 Nicotine dependence, cigarettes, uncomplicated: Secondary | ICD-10-CM | POA: Diagnosis not present

## 2021-04-13 DIAGNOSIS — Y906 Blood alcohol level of 120-199 mg/100 ml: Secondary | ICD-10-CM | POA: Diagnosis not present

## 2021-04-13 DIAGNOSIS — F10129 Alcohol abuse with intoxication, unspecified: Secondary | ICD-10-CM | POA: Diagnosis not present

## 2021-04-13 DIAGNOSIS — Z79899 Other long term (current) drug therapy: Secondary | ICD-10-CM | POA: Diagnosis not present

## 2021-04-13 DIAGNOSIS — E119 Type 2 diabetes mellitus without complications: Secondary | ICD-10-CM | POA: Insufficient documentation

## 2021-04-13 DIAGNOSIS — I10 Essential (primary) hypertension: Secondary | ICD-10-CM | POA: Insufficient documentation

## 2021-04-13 DIAGNOSIS — F10920 Alcohol use, unspecified with intoxication, uncomplicated: Secondary | ICD-10-CM

## 2021-04-13 LAB — CBC
HCT: 37 % — ABNORMAL LOW (ref 39.0–52.0)
Hemoglobin: 12.2 g/dL — ABNORMAL LOW (ref 13.0–17.0)
MCH: 33.4 pg (ref 26.0–34.0)
MCHC: 33 g/dL (ref 30.0–36.0)
MCV: 101.4 fL — ABNORMAL HIGH (ref 80.0–100.0)
Platelets: 248 10*3/uL (ref 150–400)
RBC: 3.65 MIL/uL — ABNORMAL LOW (ref 4.22–5.81)
RDW: 15.1 % (ref 11.5–15.5)
WBC: 5.9 10*3/uL (ref 4.0–10.5)
nRBC: 0 % (ref 0.0–0.2)

## 2021-04-13 LAB — COMPREHENSIVE METABOLIC PANEL
ALT: 35 U/L (ref 0–44)
AST: 34 U/L (ref 15–41)
Albumin: 3.5 g/dL (ref 3.5–5.0)
Alkaline Phosphatase: 58 U/L (ref 38–126)
Anion gap: 10 (ref 5–15)
BUN: 8 mg/dL (ref 6–20)
CO2: 21 mmol/L — ABNORMAL LOW (ref 22–32)
Calcium: 8.3 mg/dL — ABNORMAL LOW (ref 8.9–10.3)
Chloride: 111 mmol/L (ref 98–111)
Creatinine, Ser: 0.99 mg/dL (ref 0.61–1.24)
GFR, Estimated: >60 mL/min (ref >60)
Glucose, Bld: 86 mg/dL (ref 70–99)
Potassium: 3.4 mmol/L — ABNORMAL LOW (ref 3.5–5.1)
Sodium: 142 mmol/L (ref 135–145)
Total Bilirubin: 0.9 mg/dL (ref 0.3–1.2)
Total Protein: 6.6 g/dL (ref 6.5–8.1)

## 2021-04-13 LAB — ETHANOL: Alcohol, Ethyl (B): 199 mg/dL — ABNORMAL HIGH (ref <10)

## 2021-04-13 MED ORDER — SODIUM CHLORIDE 0.9 % IV BOLUS
1000.0000 mL | Freq: Once | INTRAVENOUS | Status: AC
  Administered 2021-04-13: 1000 mL via INTRAVENOUS

## 2021-04-13 MED ORDER — SODIUM CHLORIDE 0.9 % IV SOLN: INTRAVENOUS | Status: DC

## 2021-04-13 NOTE — ED Notes (Signed)
Provided pt w pair of scrub pants, pt alert, calm and cooperative, making inappropriate comments towards this RN, gave pt dc papers, pt ready for discharge
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
## 2021-04-13 NOTE — ED Notes (Signed)
Brought pt sandwich and drink 

## 2021-04-13 NOTE — Discharge Instructions (Addendum)
Return for any new or worse symptoms. °

## 2021-04-13 NOTE — ED Notes (Signed)
Pt is hypotensive, provider made aware 
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
## 2021-04-13 NOTE — ED Notes (Signed)
Brought pt snack 

## 2021-04-13 NOTE — ED Notes (Signed)
Attempted pt temp 3X. Pt not cooperating. Pt is also being very inappropriate at this time.

## 2021-04-14 ENCOUNTER — Emergency Department (HOSPITAL_COMMUNITY)
Admission: EM | Admit: 2021-04-14 | Discharge: 2021-04-14 | Disposition: A | Discharge status: Home / Self Care | Payer: Medicare (Managed Care) | Attending: Emergency Medicine | Admitting: Emergency Medicine

## 2021-04-14 ENCOUNTER — Encounter (HOSPITAL_COMMUNITY): Payer: Self-pay | Admitting: Emergency Medicine

## 2021-04-14 ENCOUNTER — Other Ambulatory Visit: Payer: Self-pay

## 2021-04-14 DIAGNOSIS — E1142 Type 2 diabetes mellitus with diabetic polyneuropathy: Secondary | ICD-10-CM | POA: Diagnosis not present

## 2021-04-14 DIAGNOSIS — Y939 Activity, unspecified: Secondary | ICD-10-CM | POA: Diagnosis not present

## 2021-04-14 DIAGNOSIS — M7021 Olecranon bursitis, right elbow: Secondary | ICD-10-CM | POA: Diagnosis not present

## 2021-04-14 DIAGNOSIS — Z79899 Other long term (current) drug therapy: Secondary | ICD-10-CM | POA: Insufficient documentation

## 2021-04-14 DIAGNOSIS — I10 Essential (primary) hypertension: Secondary | ICD-10-CM | POA: Diagnosis not present

## 2021-04-14 DIAGNOSIS — R197 Diarrhea, unspecified: Secondary | ICD-10-CM | POA: Diagnosis not present

## 2021-04-14 DIAGNOSIS — F17210 Nicotine dependence, cigarettes, uncomplicated: Secondary | ICD-10-CM | POA: Diagnosis not present

## 2021-04-14 DIAGNOSIS — R112 Nausea with vomiting, unspecified: Secondary | ICD-10-CM | POA: Diagnosis present

## 2021-04-14 LAB — CBC WITH DIFFERENTIAL/PLATELET
Abs Immature Granulocytes: 0.01 10*3/uL (ref 0.00–0.07)
Basophils Absolute: 0.1 10*3/uL (ref 0.0–0.1)
Basophils Relative: 1 %
Eosinophils Absolute: 0.2 10*3/uL (ref 0.0–0.5)
Eosinophils Relative: 2 %
HCT: 35.3 % — ABNORMAL LOW (ref 39.0–52.0)
Hemoglobin: 11.7 g/dL — ABNORMAL LOW (ref 13.0–17.0)
Immature Granulocytes: 0 %
Lymphocytes Relative: 10 %
Lymphs Abs: 0.7 10*3/uL (ref 0.7–4.0)
MCH: 33.4 pg (ref 26.0–34.0)
MCHC: 33.1 g/dL (ref 30.0–36.0)
MCV: 100.9 fL — ABNORMAL HIGH (ref 80.0–100.0)
Monocytes Absolute: 0.6 10*3/uL (ref 0.1–1.0)
Monocytes Relative: 8 %
Neutro Abs: 6 10*3/uL (ref 1.7–7.7)
Neutrophils Relative %: 79 %
Platelets: 284 10*3/uL (ref 150–400)
RBC: 3.5 MIL/uL — ABNORMAL LOW (ref 4.22–5.81)
RDW: 15.3 % (ref 11.5–15.5)
WBC: 7.5 10*3/uL (ref 4.0–10.5)
nRBC: 0 % (ref 0.0–0.2)

## 2021-04-14 LAB — COMPREHENSIVE METABOLIC PANEL
ALT: 39 U/L (ref 0–44)
AST: 40 U/L (ref 15–41)
Albumin: 3.7 g/dL (ref 3.5–5.0)
Alkaline Phosphatase: 64 U/L (ref 38–126)
Anion gap: 9 (ref 5–15)
BUN: 8 mg/dL (ref 6–20)
CO2: 24 mmol/L (ref 22–32)
Calcium: 8.8 mg/dL — ABNORMAL LOW (ref 8.9–10.3)
Chloride: 107 mmol/L (ref 98–111)
Creatinine, Ser: 1.1 mg/dL (ref 0.61–1.24)
GFR, Estimated: >60 mL/min (ref >60)
Glucose, Bld: 77 mg/dL (ref 70–99)
Potassium: 3.7 mmol/L (ref 3.5–5.1)
Sodium: 140 mmol/L (ref 135–145)
Total Bilirubin: 0.6 mg/dL (ref 0.3–1.2)
Total Protein: 6.7 g/dL (ref 6.5–8.1)

## 2021-04-14 LAB — LIPASE, BLOOD: Lipase: 24 U/L (ref 11–51)

## 2021-04-14 LAB — ETHANOL: Alcohol, Ethyl (B): <10 mg/dL (ref <10)

## 2021-04-14 MED ORDER — SODIUM CHLORIDE 0.9 % IV BOLUS
1000.0000 mL | Freq: Once | INTRAVENOUS | Status: AC
  Administered 2021-04-14: 1000 mL via INTRAVENOUS

## 2021-04-14 MED ORDER — ONDANSETRON HCL 4 MG/2ML IJ SOLN
4.0000 mg | Freq: Once | INTRAMUSCULAR | Status: AC
  Administered 2021-04-14: 4 mg via INTRAVENOUS
  Filled 2021-04-14: qty 2
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
## 2021-04-14 NOTE — ED Provider Notes (Signed)
MSE was initiated and I personally evaluated the patient and placed orders (if any) at  4:36 AM on April 14, 2021.  Patient here for nausea, vomiting, and diarrhea.  Seen earlier today at [facility] for AMS thought 2/2 alcohol intoxication.  Alert and oriented now.    Discussed with patient that their care has been initiated.   They are counseled that they will need to remain in the ED until the completion of their workup, including full H&P and results of any tests.  Risks of leaving the emergency department prior to completion of treatment were discussed. Patient was advised to inform ED staff if they are leaving before their treatment is complete. The patient acknowledged these risks and time was allowed for questions.    The patient appears stable so that the remainder of the MSE may be completed by another provider.    Roxy Horseman, PA-C 04/14/21 3202    Shon Baton, MD 04/15/21 [phone]

## 2021-04-14 NOTE — Discharge Instructions (Addendum)
Please read the attachment on elbow bursitis.  I am glad that you are feeling well.  Your laboratory work-up is reassuring.  Please keep your feet dry and clean.  Return to the ED or seek immediate medical attention for new or worsening symptoms.

## 2021-04-14 NOTE — ED Triage Notes (Signed)
Patient [place] with EMS [place] a gas station reports nausea this morning , no emesis , patient added chronic fluid filled sac at right elbow/no pain .

## 2021-04-14 NOTE — ED Notes (Signed)
Patient was given a [place] Sandwich bag w/ a Ginger Ale.

## 2021-04-15 ENCOUNTER — Emergency Department (HOSPITAL_COMMUNITY)
Admission: EM | Admit: 2021-04-15 | Discharge: 2021-04-15 | Discharge status: Absent without leave | Payer: Medicare (Managed Care) | Source: Home / Self Care

## 2021-04-15 ENCOUNTER — Emergency Department (HOSPITAL_COMMUNITY)
Admission: EM | Admit: 2021-04-15 | Discharge: 2021-04-15 | Disposition: A | Discharge status: Home / Self Care | Payer: Medicare (Managed Care) | Attending: Emergency Medicine | Admitting: Emergency Medicine

## 2021-04-15 ENCOUNTER — Other Ambulatory Visit: Payer: Self-pay

## 2021-04-15 DIAGNOSIS — F17210 Nicotine dependence, cigarettes, uncomplicated: Secondary | ICD-10-CM | POA: Insufficient documentation

## 2021-04-15 DIAGNOSIS — G8929 Other chronic pain: Secondary | ICD-10-CM | POA: Insufficient documentation

## 2021-04-15 DIAGNOSIS — I10 Essential (primary) hypertension: Secondary | ICD-10-CM | POA: Insufficient documentation

## 2021-04-15 DIAGNOSIS — M79605 Pain in left leg: Secondary | ICD-10-CM | POA: Insufficient documentation

## 2021-04-15 DIAGNOSIS — M79604 Pain in right leg: Secondary | ICD-10-CM | POA: Insufficient documentation

## 2021-04-15 DIAGNOSIS — E119 Type 2 diabetes mellitus without complications: Secondary | ICD-10-CM | POA: Insufficient documentation

## 2021-04-15 NOTE — ED Provider Notes (Signed)
MSE was initiated and I personally evaluated the patient and placed orders (if any) at  5:25 AM on April 15, 2021.  The patient appears stable so that the remainder of the MSE may be completed by another provider.  Patient ambulatory into the ED without any difficulty.     Tatum Corl, Jonny Ruiz, MD 04/15/21 [phone]
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
## 2021-04-15 NOTE — ED Triage Notes (Signed)
Patient BIB EMS from a gas station in [place], patient states he took 8 Motrin tablets because of his chronic foot/joint pain in bilateral legs. Patient states the Motrin gave him blurred vision because it has shellfish in it. VSS, BG 81 per EMS.

## 2021-04-16 ENCOUNTER — Encounter (HOSPITAL_COMMUNITY): Payer: Self-pay | Admitting: Emergency Medicine

## 2021-04-16 ENCOUNTER — Ambulatory Visit (INDEPENDENT_AMBULATORY_CARE_PROVIDER_SITE_OTHER)
Admission: EM | Admit: 2021-04-16 | Discharge: 2021-04-16 | Disposition: A | Discharge status: Home / Self Care | Payer: Medicare (Managed Care) | Source: Home / Self Care

## 2021-04-16 ENCOUNTER — Emergency Department (HOSPITAL_COMMUNITY)
Admission: EM | Admit: 2021-04-16 | Discharge: 2021-04-17 | Disposition: A | Discharge status: Home / Self Care | Payer: Medicare (Managed Care) | Source: Home / Self Care | Attending: Emergency Medicine | Admitting: Emergency Medicine

## 2021-04-16 ENCOUNTER — Emergency Department (HOSPITAL_COMMUNITY)
Admission: EM | Admit: 2021-04-16 | Discharge: 2021-04-16 | Disposition: A | Discharge status: Home / Self Care | Payer: Medicare (Managed Care) | Attending: Emergency Medicine | Admitting: Emergency Medicine

## 2021-04-16 ENCOUNTER — Other Ambulatory Visit: Payer: Self-pay

## 2021-04-16 DIAGNOSIS — M79672 Pain in left foot: Secondary | ICD-10-CM | POA: Insufficient documentation

## 2021-04-16 DIAGNOSIS — R067 Sneezing: Secondary | ICD-10-CM | POA: Diagnosis not present

## 2021-04-16 DIAGNOSIS — T7840XA Allergy, unspecified, initial encounter: Secondary | ICD-10-CM

## 2021-04-16 DIAGNOSIS — R059 Cough, unspecified: Secondary | ICD-10-CM | POA: Diagnosis present

## 2021-04-16 DIAGNOSIS — E119 Type 2 diabetes mellitus without complications: Secondary | ICD-10-CM | POA: Insufficient documentation

## 2021-04-16 DIAGNOSIS — F259 Schizoaffective disorder, unspecified: Secondary | ICD-10-CM | POA: Diagnosis not present

## 2021-04-16 DIAGNOSIS — F17210 Nicotine dependence, cigarettes, uncomplicated: Secondary | ICD-10-CM | POA: Insufficient documentation

## 2021-04-16 DIAGNOSIS — Z5900 Homelessness unspecified: Secondary | ICD-10-CM | POA: Insufficient documentation

## 2021-04-16 DIAGNOSIS — M79671 Pain in right foot: Secondary | ICD-10-CM | POA: Insufficient documentation

## 2021-04-16 DIAGNOSIS — I10 Essential (primary) hypertension: Secondary | ICD-10-CM | POA: Diagnosis not present

## 2021-04-16 DIAGNOSIS — F10920 Alcohol use, unspecified with intoxication, uncomplicated: Secondary | ICD-10-CM

## 2021-04-16 DIAGNOSIS — F1010 Alcohol abuse, uncomplicated: Secondary | ICD-10-CM | POA: Insufficient documentation

## 2021-04-16 NOTE — Discharge Instructions (Addendum)

## 2021-04-16 NOTE — ED Notes (Signed)
Pt sleeping comfortably.
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
## 2021-04-16 NOTE — ED Notes (Addendum)
Pt ambulated to the hallway. Pt is awake and alert.

## 2021-04-16 NOTE — ED Notes (Signed)
Pt asleep.

## 2021-04-16 NOTE — ED Provider Notes (Signed)
Behavioral Health Urgent Care Medical Screening Exam  Patient Name: Nathan Norton MRN: [id number] Date of Evaluation: 04/16/21 Chief Complaint: Chief Complaint/Presenting Problem: Pt was discharged from [facility] yesterday morning (04/17).  He was given Pharmacist, hospital.  Pt is homeless.  he says it is cold outside.  Pt denies any HI or SI.  He had one prevous suicide attempt as a child.  Pt hears voices of animals.  Patent is somnolent during assessment.  He was last inpatient at [facility] in 2014. Diagnosis:  Final diagnoses:  Alcoholic intoxication without complication (HCC)    History of Present illness: Nathan Norton is a 40 y.o. male.  Patient presented voluntarily to [facility]. Patient reported that the weather is cold outside and he was directed to come to [facility] for shelter.  Patient denies suicidal ideation, homicidal ideation, paranoia, visual hallucination, and there is no evidence of delusion.  Patient reports a history of auditory hallucination; he state "I hear animal voices sometimes but not right now."  Patient reports a history of schizoaffective disorder, he reports that he is currently not taking any medication and he is not interested in starting medication.  He report that he is not active with a therapist or psychiatrist. Patient denies illicit drug use. He admits to drinking alcohol daily, he was unable to quantify how much alcohol he drinks per day.    Patient was evaluated face-to-face by this provider.  During assessment, patient is drowsy and oriented x4, patient is minimally cooperative with assessment as he wants to return to sleeping.  Patient's speech is slurred and slow but coherent, patient's mood is labile, his affect is congruent with his mood, thought process is congruent, patient does not appear to be responding to internal/external stimuli.  Denies acute distress shortness of breath, chest pain, nausea/vomiting, GI/GU symptoms, headache, dizziness, fever, chills, or any recent  illness.  Per chart review patient has been seen in the ED multiple times in the past few weeks patient continues to deny any psychiatric/medical problems.  Patient repeated multiple times during assessment that he is seeking shelter and would like to return to sleep.    Psychiatric Specialty Exam  Presentation  General Appearance:Fairly Groomed  Eye Contact:None  Speech:Slow  Speech Volume:Normal  Handedness:Right   Mood and Affect  Mood:Labile  Affect:Congruent   Thought Process  Thought Processes:Coherent  Descriptions of Associations:Intact  Orientation:Full (Time, Place and Person)  Thought Content:WDL  Diagnosis of Schizophrenia or Schizoaffective disorder in past: Yes  Duration of Psychotic Symptoms: Greater than six months  Hallucinations:None  Ideas of Reference:None  Suicidal Thoughts:No  Homicidal Thoughts:No   Sensorium  Memory:Immediate Good; Remote Fair; Recent Fair  Judgment:Fair  Insight:Fair   Executive Functions  Concentration:Fair  Attention Span:Fair  Recall:Fair  Fund of Knowledge:Fair  Language:Fair   Psychomotor Activity  Psychomotor Activity:Normal   Assets  Assets:Desire for Improvement; Physical Health   Sleep  Sleep:Fair  Number of hours: No data recorded  No data recorded  Physical Exam: Physical Exam Constitutional:      General: He is not in acute distress.    Appearance: He is not ill-appearing or toxic-appearing.  HENT:     Head: Normocephalic.     Mouth/Throat:     Mouth: Mucous membranes are moist.     Pharynx: No oropharyngeal exudate or posterior oropharyngeal erythema.  Eyes:     General:        Right eye: No discharge.        Left eye: No discharge.  Cardiovascular:     Rate and Rhythm: Normal rate.  Pulmonary:     Effort: No respiratory distress.  Musculoskeletal:     Cervical back: Normal range of motion.  Neurological:     Mental Status: He is oriented to person, place, and  time.     Sensory: No sensory deficit.     Gait: Gait normal.  Psychiatric:        Attention and Perception: Attention normal. He perceives auditory hallucinations. He does not perceive visual hallucinations.        Mood and Affect: Mood is not anxious. Affect is flat.        Speech: Speech normal.        Behavior: Behavior normal.        Thought Content: Thought content normal. Thought content is not paranoid or delusional. Thought content does not include homicidal or suicidal ideation. Thought content does not include homicidal or suicidal plan.        Cognition and Memory: Cognition normal.        Judgment: Judgment normal.    Review of Systems  Constitutional: Negative for chills, fever and weight loss.  HENT: Negative.   Eyes: Negative for pain, discharge and redness.  Respiratory: Negative for cough, hemoptysis, sputum production and shortness of breath.   Cardiovascular: Negative for chest pain, palpitations and claudication.  Gastrointestinal: Negative for abdominal pain, nausea and vomiting.  Genitourinary: Negative.   Musculoskeletal: Negative.   Neurological: Negative for tingling, tremors, sensory change, speech change, seizures, loss of consciousness, weakness and headaches.  Endo/Heme/Allergies: Negative.   Psychiatric/Behavioral: Negative for depression, hallucinations, substance abuse and suicidal ideas. The patient is not nervous/anxious.    Blood pressure (!) 145/90, pulse 88, temperature 98 F (36.7 C), temperature source Temporal, resp. rate 18, SpO2 100 %. There is no height or weight on file to calculate BMI.  Musculoskeletal: Strength & Muscle Tone: within normal limits Gait & Station: normal Patient leans: Right   [facility] MSE Discharge Disposition for Follow up and Recommendations: Based on my evaluation the patient does not appear to have an emergency medical condition and can be discharged with resources and follow up care in outpatient services for  Medication Management and Individual Therapy   Maricela Bo, NP 04/16/2021, 3:12 AM

## 2021-04-16 NOTE — ED Triage Notes (Signed)
Patient reports seasonal allergies this week with persistent dry cough/sneezing.

## 2021-04-16 NOTE — ED Notes (Signed)
After visit summary given to patient. Discharge instructions explained to patient. Patient discharged with belongings

## 2021-04-16 NOTE — ED Notes (Signed)
Pt provided with food and drink.

## 2021-04-16 NOTE — ED Triage Notes (Signed)
Pt reports the tops of his toes are numb, states he's been walking in the rain all day.
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
## 2021-04-16 NOTE — ED Notes (Signed)
Black locker; walking stick in security office

## 2021-04-17 ENCOUNTER — Encounter (HOSPITAL_COMMUNITY): Payer: Self-pay | Admitting: Student
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
## 2021-04-17 NOTE — Discharge Instructions (Addendum)
Please take tylenol per over the counter dosing to help with pain as needed.  Follow up with primary care within 3 days.  Return to the ER for new or worsening symptoms or any other concerns.

## 2021-04-17 NOTE — ED Notes (Signed)
Pt currently sleeping, NAD noted. This RN asked pt if he was having pain anywhere. Pt reported pain in his knees but unwilling to rate pain at this time.

## 2021-04-18 ENCOUNTER — Encounter (HOSPITAL_COMMUNITY): Payer: Self-pay

## 2021-04-18 ENCOUNTER — Emergency Department (HOSPITAL_COMMUNITY)
Admission: EM | Admit: 2021-04-18 | Discharge: 2021-04-18 | Disposition: A | Discharge status: Home / Self Care | Payer: Medicare (Managed Care) | Attending: Emergency Medicine | Admitting: Emergency Medicine

## 2021-04-18 ENCOUNTER — Other Ambulatory Visit: Payer: Self-pay

## 2021-04-18 DIAGNOSIS — F17210 Nicotine dependence, cigarettes, uncomplicated: Secondary | ICD-10-CM | POA: Insufficient documentation

## 2021-04-18 DIAGNOSIS — E119 Type 2 diabetes mellitus without complications: Secondary | ICD-10-CM | POA: Insufficient documentation

## 2021-04-18 DIAGNOSIS — I10 Essential (primary) hypertension: Secondary | ICD-10-CM | POA: Diagnosis not present

## 2021-04-18 DIAGNOSIS — M79671 Pain in right foot: Secondary | ICD-10-CM | POA: Insufficient documentation

## 2021-04-18 DIAGNOSIS — M79672 Pain in left foot: Secondary | ICD-10-CM | POA: Diagnosis not present
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
## 2021-04-18 NOTE — ED Triage Notes (Signed)
C/o left heel and right knee pain "for a while now". Has been seen everyday.

## 2021-04-19 ENCOUNTER — Emergency Department (HOSPITAL_COMMUNITY)
Admission: EM | Admit: 2021-04-19 | Discharge: 2021-04-19 | Disposition: A | Discharge status: Home / Self Care | Payer: Medicare (Managed Care) | Attending: Emergency Medicine | Admitting: Emergency Medicine

## 2021-04-19 ENCOUNTER — Other Ambulatory Visit: Payer: Self-pay

## 2021-04-19 ENCOUNTER — Encounter (HOSPITAL_COMMUNITY): Payer: Self-pay

## 2021-04-19 DIAGNOSIS — L089 Local infection of the skin and subcutaneous tissue, unspecified: Secondary | ICD-10-CM | POA: Insufficient documentation

## 2021-04-19 DIAGNOSIS — E119 Type 2 diabetes mellitus without complications: Secondary | ICD-10-CM | POA: Insufficient documentation

## 2021-04-19 DIAGNOSIS — F17210 Nicotine dependence, cigarettes, uncomplicated: Secondary | ICD-10-CM | POA: Diagnosis not present

## 2021-04-19 DIAGNOSIS — I10 Essential (primary) hypertension: Secondary | ICD-10-CM | POA: Insufficient documentation

## 2021-04-19 DIAGNOSIS — R238 Other skin changes: Secondary | ICD-10-CM

## 2021-04-19 NOTE — ED Triage Notes (Signed)
Reports he has a skin infection all over his body that been going on for about 2 months and is itchy and stinging.

## 2021-04-19 NOTE — Discharge Instructions (Signed)
Follow up with primary care as soon as possible.  ?Return to the ER for new or worsening symptoms or any other concerns.  ?
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
## 2021-04-19 NOTE — ED Triage Notes (Signed)
Emergency Medicine Provider Triage Evaluation Note  Nathan Norton , a 40 y.o. male  was evaluated in triage.  Pt complains of infection & itching.   Review of Systems  Positive: Itching.  Negative: Cough, dyspnea, dysphagia.   Physical Exam  BP 118/75   Pulse 95   Temp 98.8 F (37.1 C) (Oral)   Resp 16   Ht 6\' 3"  (1.905 m)   Wt 91 kg   SpO2 98%   BMI 25.08 kg/m  Gen:   Awake, no distress   HEENT:  Atraumatic  Resp:  Normal effort , airway patent.  Cardiac:  Normal rate  Abd:   Nondistended, nontender  MSK:   Moves extremities without difficulty  Neuro:  Speech clear   Medical Decision Making  Medically screening exam initiated at 1:21 AM.  Appropriate orders placed.  Christoher Drudge was informed that the remainder of the evaluation will be completed by another provider, this initial triage assessment does not replace that evaluation, and the importance of remaining in the ED until their evaluation is complete.  Clinical Impression  Pruritus.    Blanchard Kelch, Cherly Anderson 04/19/21 [phone]

## 2021-04-20 ENCOUNTER — Emergency Department (HOSPITAL_COMMUNITY)
Admission: EM | Admit: 2021-04-20 | Discharge: 2021-04-20 | Disposition: A | Discharge status: Home / Self Care | Payer: Medicare (Managed Care) | Attending: Emergency Medicine | Admitting: Emergency Medicine

## 2021-04-20 DIAGNOSIS — J3489 Other specified disorders of nose and nasal sinuses: Secondary | ICD-10-CM | POA: Insufficient documentation

## 2021-04-20 DIAGNOSIS — Z5900 Homelessness unspecified: Secondary | ICD-10-CM | POA: Insufficient documentation

## 2021-04-20 DIAGNOSIS — R0981 Nasal congestion: Secondary | ICD-10-CM | POA: Diagnosis present

## 2021-04-20 DIAGNOSIS — E119 Type 2 diabetes mellitus without complications: Secondary | ICD-10-CM | POA: Insufficient documentation

## 2021-04-20 DIAGNOSIS — F17210 Nicotine dependence, cigarettes, uncomplicated: Secondary | ICD-10-CM | POA: Insufficient documentation

## 2021-04-20 DIAGNOSIS — I10 Essential (primary) hypertension: Secondary | ICD-10-CM | POA: Insufficient documentation
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
## 2021-04-20 NOTE — ED Triage Notes (Signed)
Pt presents to the ED with stuffy nose for unknown amount of time. Pt heavily intoxicated.

## 2021-04-21 ENCOUNTER — Encounter (HOSPITAL_COMMUNITY): Payer: Self-pay

## 2021-04-21 ENCOUNTER — Emergency Department (HOSPITAL_COMMUNITY)
Admission: EM | Admit: 2021-04-21 | Discharge: 2021-04-21 | Disposition: A | Discharge status: Home / Self Care | Payer: Medicare (Managed Care) | Attending: Emergency Medicine | Admitting: Emergency Medicine

## 2021-04-21 ENCOUNTER — Other Ambulatory Visit: Payer: Self-pay

## 2021-04-21 DIAGNOSIS — L853 Xerosis cutis: Secondary | ICD-10-CM | POA: Diagnosis not present

## 2021-04-21 DIAGNOSIS — F17210 Nicotine dependence, cigarettes, uncomplicated: Secondary | ICD-10-CM | POA: Insufficient documentation

## 2021-04-21 DIAGNOSIS — I10 Essential (primary) hypertension: Secondary | ICD-10-CM | POA: Diagnosis not present

## 2021-04-21 DIAGNOSIS — E119 Type 2 diabetes mellitus without complications: Secondary | ICD-10-CM | POA: Insufficient documentation

## 2021-04-21 DIAGNOSIS — R21 Rash and other nonspecific skin eruption: Secondary | ICD-10-CM | POA: Diagnosis present

## 2021-04-21 DIAGNOSIS — L309 Dermatitis, unspecified: Secondary | ICD-10-CM

## 2021-04-21 NOTE — Discharge Instructions (Addendum)
Apply Cetaphil or other skin moisturizer to your hands and other affected areas twice daily.  Follow-up with your primary doctor if not improving.

## 2021-04-21 NOTE — ED Triage Notes (Signed)
Patient reports skin infection on his feet, seen for same multiple times this week.
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
## 2021-04-24 ENCOUNTER — Emergency Department (HOSPITAL_COMMUNITY)
Admission: EM | Admit: 2021-04-24 | Discharge: 2021-04-24 | Disposition: A | Discharge status: Home / Self Care | Payer: Medicare (Managed Care) | Attending: Emergency Medicine | Admitting: Emergency Medicine

## 2021-04-24 ENCOUNTER — Emergency Department (HOSPITAL_COMMUNITY): Payer: Medicare (Managed Care)

## 2021-04-24 ENCOUNTER — Encounter (HOSPITAL_COMMUNITY): Payer: Self-pay

## 2021-04-24 DIAGNOSIS — E119 Type 2 diabetes mellitus without complications: Secondary | ICD-10-CM | POA: Diagnosis not present

## 2021-04-24 DIAGNOSIS — F17210 Nicotine dependence, cigarettes, uncomplicated: Secondary | ICD-10-CM | POA: Insufficient documentation

## 2021-04-24 DIAGNOSIS — I10 Essential (primary) hypertension: Secondary | ICD-10-CM | POA: Diagnosis not present

## 2021-04-24 DIAGNOSIS — M79671 Pain in right foot: Secondary | ICD-10-CM | POA: Insufficient documentation

## 2021-04-24 DIAGNOSIS — M79672 Pain in left foot: Secondary | ICD-10-CM | POA: Diagnosis not present

## 2021-04-24 MED ORDER — ACETAMINOPHEN 325 MG PO TABS
325.0000 mg | ORAL_TABLET | Freq: Once | ORAL | Status: AC
  Administered 2021-04-24: 325 mg via ORAL
  Filled 2021-04-24: qty 1
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
## 2021-04-24 NOTE — ED Notes (Signed)
Pt states he has tingling to feet bilat, no swelling or deformities noted to feet.

## 2021-04-24 NOTE — ED Provider Notes (Signed)
MSE was initiated and I personally evaluated the patient and placed orders (if any) at  4:16 AM on April 24, 2021.  The patient appears stable so that the remainder of the MSE may be completed by another provider.  Patient placed in Quick Look pathway, seen and evaluated   Chief Complaint: foot pain  HPI:   40 y.o. M who presents for evaluation of bilateral foot pain. He states it hurts mostly on the bottom. He has been walking a lot. Does endorse falling early today.   ROS: foot pain   Physical Exam:   Gen: No distress  Neuro: Awake and Alert  Skin: Warm    Focused Exam: 2+ DP pulses bilaterally. Good distal cap refill. BLE is not dusky in appearance or cool to touch.   Initiation of care has begun. The patient has been counseled on the process, plan, and necessity for staying for the completion/evaluation, and the remainder of the medical screening examination    Rosana Hoes 04/24/21 0417    Shon Baton, MD 04/24/21 [phone]

## 2021-04-24 NOTE — ED Triage Notes (Signed)
Pt states he is having bilateral foot pain due to walking on them all night. Pt was here for the same 4/23

## 2021-04-24 NOTE — Discharge Instructions (Signed)
You were seen in the emergency department for the pain in your toes.  Your physical exam was reassuring today.  You were given Tylenol in the emergency department to help with your pain.  Follow-up with the [facility] community health [facility] wellness clinic listed below.  There is no emergent problem with your feet at this time.  Return to the ER if you develop any new severe symptoms.

## 2021-04-25 ENCOUNTER — Encounter (HOSPITAL_COMMUNITY): Payer: Self-pay

## 2021-04-25 ENCOUNTER — Emergency Department (HOSPITAL_COMMUNITY)
Admission: EM | Admit: 2021-04-25 | Discharge: 2021-04-25 | Disposition: A | Discharge status: Home / Self Care | Payer: Medicare (Managed Care) | Attending: Emergency Medicine | Admitting: Emergency Medicine

## 2021-04-25 ENCOUNTER — Other Ambulatory Visit: Payer: Self-pay

## 2021-04-25 DIAGNOSIS — M25512 Pain in left shoulder: Secondary | ICD-10-CM | POA: Insufficient documentation

## 2021-04-25 DIAGNOSIS — F10129 Alcohol abuse with intoxication, unspecified: Secondary | ICD-10-CM | POA: Diagnosis present

## 2021-04-25 DIAGNOSIS — F17210 Nicotine dependence, cigarettes, uncomplicated: Secondary | ICD-10-CM | POA: Insufficient documentation

## 2021-04-25 DIAGNOSIS — E119 Type 2 diabetes mellitus without complications: Secondary | ICD-10-CM | POA: Diagnosis not present

## 2021-04-25 DIAGNOSIS — F10920 Alcohol use, unspecified with intoxication, uncomplicated: Secondary | ICD-10-CM

## 2021-04-25 DIAGNOSIS — I10 Essential (primary) hypertension: Secondary | ICD-10-CM | POA: Diagnosis not present

## 2021-04-25 DIAGNOSIS — W19XXXA Unspecified fall, initial encounter: Secondary | ICD-10-CM | POA: Insufficient documentation

## 2021-04-25 DIAGNOSIS — R410 Disorientation, unspecified: Secondary | ICD-10-CM | POA: Insufficient documentation

## 2021-04-25 DIAGNOSIS — M79605 Pain in left leg: Secondary | ICD-10-CM | POA: Diagnosis not present
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
## 2021-04-25 NOTE — ED Notes (Signed)
Patient is resting comfortably and asleep 

## 2021-04-25 NOTE — ED Notes (Signed)
Pt verbalized understanding of d/c. Pt provided with sandwich as requested.

## 2021-04-28 ENCOUNTER — Emergency Department (HOSPITAL_COMMUNITY)
Admission: EM | Admit: 2021-04-28 | Discharge: 2021-04-28 | Disposition: A | Discharge status: Home / Self Care | Payer: Medicare (Managed Care) | Attending: Emergency Medicine | Admitting: Emergency Medicine

## 2021-04-28 ENCOUNTER — Other Ambulatory Visit: Payer: Self-pay

## 2021-04-28 DIAGNOSIS — E119 Type 2 diabetes mellitus without complications: Secondary | ICD-10-CM | POA: Diagnosis not present

## 2021-04-28 DIAGNOSIS — I10 Essential (primary) hypertension: Secondary | ICD-10-CM | POA: Diagnosis not present

## 2021-04-28 DIAGNOSIS — F10129 Alcohol abuse with intoxication, unspecified: Secondary | ICD-10-CM | POA: Diagnosis not present

## 2021-04-28 DIAGNOSIS — F17210 Nicotine dependence, cigarettes, uncomplicated: Secondary | ICD-10-CM | POA: Insufficient documentation

## 2021-04-28 DIAGNOSIS — F10920 Alcohol use, unspecified with intoxication, uncomplicated: Secondary | ICD-10-CM

## 2021-04-28 NOTE — ED Notes (Signed)
Patient seen walking to ED exit at this time with steady gait. Patient pleasant with RN at this time. Pocket knife returned to patient at this time.
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
## 2021-04-28 NOTE — ED Notes (Signed)
Security called to return pocket knife.

## 2021-04-28 NOTE — ED Notes (Signed)
Patient given discharge paperwork and instructions. Refused discharged vitals. Patient is ambulatory and getting dressed at this time. In NAD.

## 2021-04-28 NOTE — ED Notes (Signed)
Pocket knife given to security. 

## 2021-04-28 NOTE — Discharge Instructions (Addendum)
Get help right away if: °You have any of the following: °Moderate to severe trouble with coordination, speech, memory, or attention. °Trouble staying awake. °Severe confusion. °A seizure. °Light-headedness. °Fainting. °Vomiting bright red blood or material that looks like coffee grounds. °Bloody stool (feces). The blood may make your stool bright red, black, or tarry. It may also smell bad. °Shakiness when trying to stop drinking. °Thoughts about hurting yourself or others. °

## 2021-04-28 NOTE — ED Notes (Signed)
Pocket knife returned to RN by security at this time. Patient still getting dressed. Will given when patient is walking out of ED doors.
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
## 2021-04-30 ENCOUNTER — Other Ambulatory Visit: Payer: Self-pay

## 2021-04-30 ENCOUNTER — Emergency Department (HOSPITAL_COMMUNITY)
Admission: EM | Admit: 2021-04-30 | Discharge: 2021-04-30 | Disposition: A | Discharge status: Home / Self Care | Payer: Medicare (Managed Care) | Attending: Emergency Medicine | Admitting: Emergency Medicine

## 2021-04-30 ENCOUNTER — Encounter (HOSPITAL_COMMUNITY): Payer: Self-pay | Admitting: Emergency Medicine

## 2021-04-30 DIAGNOSIS — E119 Type 2 diabetes mellitus without complications: Secondary | ICD-10-CM | POA: Diagnosis not present

## 2021-04-30 DIAGNOSIS — F17210 Nicotine dependence, cigarettes, uncomplicated: Secondary | ICD-10-CM | POA: Diagnosis not present

## 2021-04-30 DIAGNOSIS — M79671 Pain in right foot: Secondary | ICD-10-CM | POA: Diagnosis not present

## 2021-04-30 DIAGNOSIS — F10920 Alcohol use, unspecified with intoxication, uncomplicated: Secondary | ICD-10-CM

## 2021-04-30 DIAGNOSIS — M79672 Pain in left foot: Secondary | ICD-10-CM | POA: Insufficient documentation

## 2021-04-30 DIAGNOSIS — I10 Essential (primary) hypertension: Secondary | ICD-10-CM | POA: Diagnosis not present

## 2021-04-30 LAB — CBG MONITORING, ED: Glucose-Capillary: 82 mg/dL (ref 70–99)

## 2021-04-30 NOTE — ED Notes (Signed)
Provider at bedside at this time

## 2021-04-30 NOTE — ED Notes (Signed)
Pt in bed at this time 

## 2021-04-30 NOTE — ED Notes (Signed)
Pt c/o blisters on bilat feet that have been there for awhile, co pain to bilat feet, rt elbow, and lt shoulder that is chronic pain. Pt also states that he needs his medication and needs paper scripts as well.

## 2021-04-30 NOTE — ED Triage Notes (Signed)
Pt reports bilateral foot pain "because I walked to much."  Hx of same, no changes since last visit

## 2021-04-30 NOTE — ED Notes (Signed)
Patient walked out without notifying staff

## 2021-04-30 NOTE — ED Notes (Signed)
Pt taken to restroom via wheelchair at this time.
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
## 2021-05-04 ENCOUNTER — Emergency Department (HOSPITAL_COMMUNITY)
Admission: EM | Admit: 2021-05-04 | Discharge: 2021-05-04 | Disposition: A | Discharge status: Home / Self Care | Payer: Medicare (Managed Care) | Attending: Emergency Medicine | Admitting: Emergency Medicine

## 2021-05-04 ENCOUNTER — Other Ambulatory Visit: Payer: Self-pay

## 2021-05-04 DIAGNOSIS — E119 Type 2 diabetes mellitus without complications: Secondary | ICD-10-CM | POA: Insufficient documentation

## 2021-05-04 DIAGNOSIS — F10129 Alcohol abuse with intoxication, unspecified: Secondary | ICD-10-CM | POA: Diagnosis not present

## 2021-05-04 DIAGNOSIS — I10 Essential (primary) hypertension: Secondary | ICD-10-CM | POA: Insufficient documentation

## 2021-05-04 DIAGNOSIS — F17210 Nicotine dependence, cigarettes, uncomplicated: Secondary | ICD-10-CM | POA: Diagnosis not present

## 2021-05-04 DIAGNOSIS — F10920 Alcohol use, unspecified with intoxication, uncomplicated: Secondary | ICD-10-CM

## 2021-05-04 LAB — CBG MONITORING, ED: Glucose-Capillary: 89 mg/dL (ref 70–99)

## 2021-05-04 NOTE — ED Notes (Signed)
Patient ambulatory with no assistance and currently refusing to be evaluated by Dr. Madilyn Hook. Dr. Madilyn Hook to d/c
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
## 2021-05-06 ENCOUNTER — Encounter (HOSPITAL_COMMUNITY): Payer: Self-pay

## 2021-05-06 ENCOUNTER — Emergency Department (HOSPITAL_COMMUNITY)
Admission: EM | Admit: 2021-05-06 | Discharge: 2021-05-06 | Disposition: A | Discharge status: Home / Self Care | Payer: Medicare (Managed Care) | Attending: Emergency Medicine | Admitting: Emergency Medicine

## 2021-05-06 ENCOUNTER — Other Ambulatory Visit: Payer: Self-pay

## 2021-05-06 DIAGNOSIS — M79671 Pain in right foot: Secondary | ICD-10-CM | POA: Diagnosis not present

## 2021-05-06 DIAGNOSIS — E119 Type 2 diabetes mellitus without complications: Secondary | ICD-10-CM | POA: Diagnosis not present

## 2021-05-06 DIAGNOSIS — I10 Essential (primary) hypertension: Secondary | ICD-10-CM | POA: Insufficient documentation

## 2021-05-06 DIAGNOSIS — M79672 Pain in left foot: Secondary | ICD-10-CM | POA: Diagnosis not present

## 2021-05-06 DIAGNOSIS — F17210 Nicotine dependence, cigarettes, uncomplicated: Secondary | ICD-10-CM | POA: Insufficient documentation

## 2021-05-06 NOTE — ED Triage Notes (Signed)
Patient told registration that he is here for fever, patient states he is here for foot pain in triage but also states he needs Abilify and benadryl.
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
## 2021-05-09 ENCOUNTER — Other Ambulatory Visit: Payer: Self-pay

## 2021-05-09 ENCOUNTER — Encounter (HOSPITAL_COMMUNITY): Payer: Self-pay | Admitting: Emergency Medicine

## 2021-05-09 ENCOUNTER — Emergency Department (HOSPITAL_COMMUNITY)
Admission: EM | Admit: 2021-05-09 | Discharge: 2021-05-09 | Disposition: A | Discharge status: Home / Self Care | Payer: Medicare (Managed Care) | Attending: Emergency Medicine | Admitting: Emergency Medicine

## 2021-05-09 DIAGNOSIS — R300 Dysuria: Secondary | ICD-10-CM | POA: Insufficient documentation

## 2021-05-09 DIAGNOSIS — F17210 Nicotine dependence, cigarettes, uncomplicated: Secondary | ICD-10-CM | POA: Diagnosis not present

## 2021-05-09 DIAGNOSIS — I10 Essential (primary) hypertension: Secondary | ICD-10-CM | POA: Insufficient documentation

## 2021-05-09 DIAGNOSIS — E119 Type 2 diabetes mellitus without complications: Secondary | ICD-10-CM | POA: Diagnosis not present

## 2021-05-09 LAB — URINALYSIS, ROUTINE W REFLEX MICROSCOPIC
Bilirubin Urine: NEGATIVE
Glucose, UA: NEGATIVE mg/dL
Hgb urine dipstick: NEGATIVE
Ketones, ur: NEGATIVE mg/dL
Leukocytes,Ua: NEGATIVE
Nitrite: NEGATIVE
Protein, ur: NEGATIVE mg/dL
Specific Gravity, Urine: 1.005 (ref 1.005–1.030)
pH: 6 (ref 5.0–8.0)

## 2021-05-09 NOTE — ED Triage Notes (Signed)
Pt c/o pain with urination

## 2021-05-09 NOTE — ED Notes (Signed)
Sent a urine culture with the urine specimen 

## 2021-05-09 NOTE — ED Notes (Signed)
Patient verbalizes understanding of discharge instructions. Opportunity for questioning and answers were provided. Armband removed by staff, pt discharged from ED ambulatory. Clean socks and pants were given to pt prior to discharge.

## 2021-05-09 NOTE — ED Notes (Signed)
Informed pt that the lid was not closed on the urine sample he previously gave and gave pt a urinal bottle and told him to give [place] more urine ASAP
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
## 2021-05-13 ENCOUNTER — Other Ambulatory Visit: Payer: Self-pay

## 2021-05-13 ENCOUNTER — Emergency Department (HOSPITAL_COMMUNITY)
Admission: EM | Admit: 2021-05-13 | Discharge: 2021-05-13 | Disposition: A | Discharge status: Home / Self Care | Payer: Medicare (Managed Care) | Attending: Emergency Medicine | Admitting: Emergency Medicine

## 2021-05-13 DIAGNOSIS — I10 Essential (primary) hypertension: Secondary | ICD-10-CM | POA: Insufficient documentation

## 2021-05-13 DIAGNOSIS — F419 Anxiety disorder, unspecified: Secondary | ICD-10-CM | POA: Diagnosis present

## 2021-05-13 DIAGNOSIS — F17210 Nicotine dependence, cigarettes, uncomplicated: Secondary | ICD-10-CM | POA: Diagnosis not present

## 2021-05-13 DIAGNOSIS — E119 Type 2 diabetes mellitus without complications: Secondary | ICD-10-CM | POA: Insufficient documentation

## 2021-05-13 NOTE — ED Triage Notes (Addendum)
Pt states, "my nerves are bad." Pt states he has shakes. Pt has abrasion and lac on left arm that is red. Pt denies trying to hurt himself. Pt denies SI/HI/auditory/visual hallucinations. Pt states he has been drinking moonshine tonight, denies drug use.
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
## 2021-06-14 ENCOUNTER — Other Ambulatory Visit: Payer: Self-pay

## 2021-06-14 ENCOUNTER — Emergency Department (HOSPITAL_COMMUNITY)
Admission: EM | Admit: 2021-06-14 | Discharge: 2021-06-16 | Disposition: A | Discharge status: Home / Self Care | Payer: Medicare (Managed Care) | Attending: Emergency Medicine | Admitting: Emergency Medicine

## 2021-06-14 DIAGNOSIS — M79671 Pain in right foot: Secondary | ICD-10-CM | POA: Insufficient documentation

## 2021-06-14 DIAGNOSIS — M79672 Pain in left foot: Secondary | ICD-10-CM | POA: Diagnosis not present

## 2021-06-14 DIAGNOSIS — Z5321 Procedure and treatment not carried out due to patient leaving prior to being seen by health care provider: Secondary | ICD-10-CM | POA: Insufficient documentation

## 2021-06-14 DIAGNOSIS — M79673 Pain in unspecified foot: Secondary | ICD-10-CM

## 2021-06-15 NOTE — ED Notes (Signed)
Pt called to be brought back to room, no response.   

## 2021-06-15 NOTE — ED Provider Notes (Signed)
Emergency Medicine Provider Triage Evaluation Note  Nathan Norton , a 40 y.o. male  was evaluated in triage.  Pt complains of bilateral foot pain without injury.  Well known to department for same.  Review of Systems  Positive: Foot pain Negative: fever  Physical Exam  BP 138/86 (BP Location: Right Arm)   Pulse 78   Temp 98 F (36.7 C) (Oral)   Resp 15   SpO2 98%  Gen:   Awake, no distress   Resp:  Normal effort  MSK:   Moves extremities without difficulty  Other:    Medical Decision Making  Medically screening exam initiated at 12:18 AM.  Appropriate orders placed.  Nathan Norton was informed that the remainder of the evaluation will be completed by another provider, this initial triage assessment does not replace that evaluation, and the importance of remaining in the ED until their evaluation is complete.    [name], PA-C 06/15/21 0019    Glynn Octave, MD 06/15/21 [phone]

## 2021-06-15 NOTE — ED Triage Notes (Signed)
Pt complaining of foot pain going on "for awhile."

## 2021-06-20 ENCOUNTER — Emergency Department (HOSPITAL_COMMUNITY)
Admission: EM | Admit: 2021-06-20 | Discharge: 2021-06-20 | Disposition: A | Discharge status: Home / Self Care | Payer: Medicare (Managed Care) | Attending: Emergency Medicine | Admitting: Emergency Medicine

## 2021-06-20 ENCOUNTER — Encounter (HOSPITAL_COMMUNITY): Payer: Self-pay

## 2021-06-20 ENCOUNTER — Encounter (HOSPITAL_COMMUNITY): Payer: Self-pay | Admitting: *Deleted

## 2021-06-20 ENCOUNTER — Other Ambulatory Visit: Payer: Self-pay

## 2021-06-20 ENCOUNTER — Emergency Department (HOSPITAL_COMMUNITY)
Admission: EM | Admit: 2021-06-20 | Discharge: 2021-06-20 | Disposition: A | Discharge status: Home / Self Care | Payer: Medicare (Managed Care) | Source: Home / Self Care | Attending: Emergency Medicine | Admitting: Emergency Medicine

## 2021-06-20 DIAGNOSIS — I10 Essential (primary) hypertension: Secondary | ICD-10-CM | POA: Insufficient documentation

## 2021-06-20 DIAGNOSIS — G8929 Other chronic pain: Secondary | ICD-10-CM | POA: Insufficient documentation

## 2021-06-20 DIAGNOSIS — M79672 Pain in left foot: Secondary | ICD-10-CM | POA: Insufficient documentation

## 2021-06-20 DIAGNOSIS — E119 Type 2 diabetes mellitus without complications: Secondary | ICD-10-CM | POA: Insufficient documentation

## 2021-06-20 DIAGNOSIS — F17210 Nicotine dependence, cigarettes, uncomplicated: Secondary | ICD-10-CM | POA: Insufficient documentation

## 2021-06-20 DIAGNOSIS — F10129 Alcohol abuse with intoxication, unspecified: Secondary | ICD-10-CM | POA: Diagnosis not present

## 2021-06-20 DIAGNOSIS — F10920 Alcohol use, unspecified with intoxication, uncomplicated: Secondary | ICD-10-CM
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
## 2021-06-20 NOTE — Progress Notes (Signed)
CSW met with Pt at bedside. CSW attempted to administer CAGE-AID, Pt answered No to all questions despite history of ETOH/SUD. Pt declined any SUD resources. Pt states that he receives SSI and EBT benefits but has lost cards. Pt states that he does not know how to get to DSS offices. CSW printed map with directions to [place] offices. Pt states that he has lost his ID, Pt reports that he does know how to get to [facility], CSW counseled Pt to utilize [facility] to obtain replacement ID. CSW provided replacement shoes and clothing from TOC closet.  CSW provided sack lunch and personal care package.

## 2021-06-20 NOTE — Discharge Instructions (Addendum)
Please see your doctor for further management of non-emergency, chronic foot pain.

## 2021-06-20 NOTE — Social Work (Signed)
CSW attempted to meet with Pt.  Pt was lethargic and refused to open eyes and interact with CSW.

## 2021-06-20 NOTE — ED Triage Notes (Signed)
Pt c/o LT foot pain and "tingles for a while".

## 2021-06-20 NOTE — ED Triage Notes (Signed)
Found outside of dollar tree and pupils 2. Woke up with NPA insertion. Admits to ETOH use but no drugs. 18 G LAC.  BP 96/54, HR 86, CBG 89. Pt ambulated to the bathroom

## 2021-06-20 NOTE — ED Notes (Addendum)
SW at bedside with direction/instruction for patient.

## 2021-06-20 NOTE — ED Notes (Signed)
SW at bedside. Patient responds verbally to Name but will not sit up to talk with RN or drink fluids offered after SW left the room. Ginger ale and water left at bedside with verbal instruction to drink. VSS. Respirations remain even and unlabored.

## 2021-06-20 NOTE — Discharge Instructions (Addendum)
Please go to the interactive resource center I have given you the information for the site.  Please stop drinking alcohol.  Please drink plenty of water.  Please follow-up with the [facility] health clinic.
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
## 2021-06-28 ENCOUNTER — Emergency Department (HOSPITAL_COMMUNITY)
Admission: EM | Admit: 2021-06-28 | Discharge: 2021-06-28 | Disposition: A | Discharge status: Home / Self Care | Payer: Medicare (Managed Care) | Attending: Emergency Medicine | Admitting: Emergency Medicine

## 2021-06-28 ENCOUNTER — Other Ambulatory Visit: Payer: Self-pay

## 2021-06-28 ENCOUNTER — Encounter (HOSPITAL_COMMUNITY): Payer: Self-pay | Admitting: Emergency Medicine

## 2021-06-28 DIAGNOSIS — G8929 Other chronic pain: Secondary | ICD-10-CM

## 2021-06-28 DIAGNOSIS — E119 Type 2 diabetes mellitus without complications: Secondary | ICD-10-CM | POA: Diagnosis not present

## 2021-06-28 DIAGNOSIS — M79671 Pain in right foot: Secondary | ICD-10-CM | POA: Insufficient documentation

## 2021-06-28 DIAGNOSIS — F17210 Nicotine dependence, cigarettes, uncomplicated: Secondary | ICD-10-CM | POA: Diagnosis not present

## 2021-06-28 DIAGNOSIS — M79672 Pain in left foot: Secondary | ICD-10-CM | POA: Diagnosis not present

## 2021-06-28 DIAGNOSIS — I10 Essential (primary) hypertension: Secondary | ICD-10-CM | POA: Diagnosis not present
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
## 2021-06-28 NOTE — ED Triage Notes (Signed)
Pt arrive POV with complain of food pain and blister, pt denies any injury. Pt able to ambulate to triage without difficulty.

## 2021-06-28 NOTE — Discharge Instructions (Addendum)
Thank you for allowing me to care for you today in the Emergency Department.   You were seen today for foot pain.  I did not see any infected blisters.  You should avoid putting paper in your shoes as this will get moist and breakdown.  Wear socks.  You can apply Band-Aids over areas that are sore and painful.  Neosporin is available over-the-counter and can be used on areas that are sore.  Follow-up with Clerance Lav at the [facility] if your pain continues.  You can take Tylenol and ibuprofen, which are available over-the-counter for your symptoms.  Return if you have fevers, bleeding and thick, mucus-like drainage from the wounds, or other new, concerning symptoms.

## 2021-06-28 NOTE — ED Notes (Signed)
Pt foot cleaned, band aid placed on blister and clean socks put on. Pt provided care instructions to help healing and prevent further injury.

## 2021-07-01 ENCOUNTER — Encounter (HOSPITAL_COMMUNITY): Payer: Self-pay

## 2021-07-01 ENCOUNTER — Other Ambulatory Visit: Payer: Self-pay

## 2021-07-01 ENCOUNTER — Emergency Department (HOSPITAL_COMMUNITY)
Admission: EM | Admit: 2021-07-01 | Discharge: 2021-07-01 | Disposition: A | Discharge status: Home / Self Care | Payer: Medicare (Managed Care) | Attending: Emergency Medicine | Admitting: Emergency Medicine

## 2021-07-01 DIAGNOSIS — T7840XA Allergy, unspecified, initial encounter: Secondary | ICD-10-CM | POA: Insufficient documentation

## 2021-07-01 DIAGNOSIS — Z5321 Procedure and treatment not carried out due to patient leaving prior to being seen by health care provider: Secondary | ICD-10-CM | POA: Diagnosis not present

## 2021-07-01 NOTE — ED Triage Notes (Signed)
Patient complains of allergies and request meds for dry skin, NAD

## 2021-07-06 ENCOUNTER — Other Ambulatory Visit: Payer: Self-pay

## 2021-07-06 ENCOUNTER — Emergency Department (HOSPITAL_COMMUNITY)
Admission: EM | Admit: 2021-07-06 | Discharge: 2021-07-06 | Discharge status: Against Medical Advice | Payer: Medicare (Managed Care) | Source: Home / Self Care

## 2021-07-06 ENCOUNTER — Encounter (HOSPITAL_COMMUNITY): Payer: Self-pay | Admitting: Emergency Medicine

## 2021-07-06 ENCOUNTER — Emergency Department (HOSPITAL_COMMUNITY)
Admission: EM | Admit: 2021-07-06 | Discharge: 2021-07-06 | Disposition: A | Discharge status: Home / Self Care | Payer: Medicare (Managed Care) | Attending: Emergency Medicine | Admitting: Emergency Medicine

## 2021-07-06 DIAGNOSIS — I10 Essential (primary) hypertension: Secondary | ICD-10-CM | POA: Diagnosis not present

## 2021-07-06 DIAGNOSIS — F17210 Nicotine dependence, cigarettes, uncomplicated: Secondary | ICD-10-CM | POA: Insufficient documentation

## 2021-07-06 DIAGNOSIS — H5789 Other specified disorders of eye and adnexa: Secondary | ICD-10-CM | POA: Insufficient documentation

## 2021-07-06 DIAGNOSIS — Z5321 Procedure and treatment not carried out due to patient leaving prior to being seen by health care provider: Secondary | ICD-10-CM | POA: Insufficient documentation

## 2021-07-06 DIAGNOSIS — M79672 Pain in left foot: Secondary | ICD-10-CM | POA: Insufficient documentation

## 2021-07-06 DIAGNOSIS — H1013 Acute atopic conjunctivitis, bilateral: Secondary | ICD-10-CM

## 2021-07-06 DIAGNOSIS — L509 Urticaria, unspecified: Secondary | ICD-10-CM | POA: Insufficient documentation

## 2021-07-06 DIAGNOSIS — M79671 Pain in right foot: Secondary | ICD-10-CM | POA: Insufficient documentation

## 2021-07-06 DIAGNOSIS — R21 Rash and other nonspecific skin eruption: Secondary | ICD-10-CM | POA: Diagnosis present

## 2021-07-06 DIAGNOSIS — E119 Type 2 diabetes mellitus without complications: Secondary | ICD-10-CM | POA: Diagnosis not present

## 2021-07-06 MED ORDER — NAPHAZOLINE-GLYCERIN 0.012-0.25 % OP SOLN
1.0000 [drp] | Freq: Four times a day (QID) | OPHTHALMIC | Status: DC | PRN
  Administered 2021-07-06: 2 [drp] via OPHTHALMIC
  Filled 2021-07-06: qty 15

## 2021-07-06 MED ORDER — LORATADINE 10 MG PO TABS
10.0000 mg | ORAL_TABLET | Freq: Once | ORAL | Status: AC
  Administered 2021-07-06: 10 mg via ORAL
  Filled 2021-07-06: qty 1

## 2021-07-06 MED ORDER — CETIRIZINE HCL 10 MG PO TABS: 10.0000 mg | ORAL_TABLET | Freq: Every day | ORAL | 0 refills | Status: DC

## 2021-07-06 NOTE — ED Notes (Signed)
Pt name called for vitals before triage, no response

## 2021-07-06 NOTE — Discharge Instructions (Addendum)
Take Zyrtec 10 mg daily.  Use eyedrops twice daily   See shelter resources   Follow up with your doctor   Return to ER if you have worse rash, worse eye drainage, fever

## 2021-07-06 NOTE — ED Provider Notes (Signed)
Emergency Medicine Provider Triage Evaluation Note  Nathan Norton , a 40 y.o. male  was evaluated in triage.  Pt complains of toenail pain.  For approximately 4 months no other complaints no fevers or chills..  Review of Systems  Positive: Toe pain Negative: Fever  Physical Exam  There were no vitals taken for this visit. Gen:   Awake, no distress   Resp:  Normal effort  MSK:   Moves extremities without difficulty  Other:    Medical Decision Making  Medically screening exam initiated at 1:24 PM.  Appropriate orders placed.  Bjorn Hallas was informed that the remainder of the evaluation will be completed by another provider, this initial triage assessment does not replace that evaluation, and the importance of remaining in the ED until their evaluation is complete.  Patient is well-appearing.  No acute emergent condition. Feet are relatively well-appearing.  No evidence of infection.   Gailen Shelter, [place] 07/06/21 1326    Wynetta Fines, MD 07/08/21 [phone]
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
## 2021-07-06 NOTE — ED Triage Notes (Signed)
Pt c/o worsening eye drainage and eye redness, denies chemical exposure, denies eye pain, & denies any other symptoms. Seen 7/3 for allergies, states allergies have not improved.

## 2021-07-06 NOTE — ED Triage Notes (Signed)
Patient here with complaint of pain in toenails on both feet that started approximately April this year. Patient alert, oriented, and in no apparent distress at this time.

## 2021-07-07 ENCOUNTER — Encounter (HOSPITAL_COMMUNITY): Payer: Self-pay | Admitting: Emergency Medicine

## 2021-07-07 ENCOUNTER — Emergency Department (HOSPITAL_COMMUNITY)
Admission: EM | Admit: 2021-07-07 | Discharge: 2021-07-07 | Disposition: A | Discharge status: Home / Self Care | Payer: Medicare (Managed Care) | Attending: Emergency Medicine | Admitting: Emergency Medicine

## 2021-07-07 DIAGNOSIS — S60561A Insect bite (nonvenomous) of right hand, initial encounter: Secondary | ICD-10-CM | POA: Diagnosis not present

## 2021-07-07 DIAGNOSIS — I10 Essential (primary) hypertension: Secondary | ICD-10-CM | POA: Diagnosis not present

## 2021-07-07 DIAGNOSIS — F17210 Nicotine dependence, cigarettes, uncomplicated: Secondary | ICD-10-CM | POA: Diagnosis not present

## 2021-07-07 DIAGNOSIS — E119 Type 2 diabetes mellitus without complications: Secondary | ICD-10-CM | POA: Insufficient documentation

## 2021-07-07 DIAGNOSIS — W57XXXA Bitten or stung by nonvenomous insect and other nonvenomous arthropods, initial encounter: Secondary | ICD-10-CM | POA: Insufficient documentation

## 2021-07-07 DIAGNOSIS — S60569A Insect bite (nonvenomous) of unspecified hand, initial encounter: Secondary | ICD-10-CM

## 2021-07-07 MED ORDER — ACETAMINOPHEN 500 MG PO TABS
1000.0000 mg | ORAL_TABLET | Freq: Once | ORAL | Status: AC
  Administered 2021-07-07: 1000 mg via ORAL
  Filled 2021-07-07: qty 2

## 2021-07-07 MED ORDER — LORATADINE 10 MG PO TABS
10.0000 mg | ORAL_TABLET | Freq: Once | ORAL | Status: AC
  Administered 2021-07-07: 10 mg via ORAL
  Filled 2021-07-07: qty 1
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
## 2021-07-07 NOTE — ED Triage Notes (Signed)
Pt c/o right hand spider bite. No swelling/abnormality noted.

## 2021-07-08 ENCOUNTER — Encounter (HOSPITAL_COMMUNITY): Payer: Self-pay | Admitting: Emergency Medicine

## 2021-07-08 ENCOUNTER — Emergency Department (HOSPITAL_COMMUNITY)
Admission: EM | Admit: 2021-07-08 | Discharge: 2021-07-09 | Disposition: A | Discharge status: Home / Self Care | Payer: Medicare (Managed Care) | Source: Home / Self Care | Attending: Emergency Medicine | Admitting: Emergency Medicine

## 2021-07-08 ENCOUNTER — Other Ambulatory Visit: Payer: Self-pay

## 2021-07-08 ENCOUNTER — Encounter (HOSPITAL_COMMUNITY): Payer: Self-pay

## 2021-07-08 ENCOUNTER — Emergency Department (HOSPITAL_COMMUNITY)
Admission: EM | Admit: 2021-07-08 | Discharge: 2021-07-08 | Disposition: A | Discharge status: Home / Self Care | Payer: Medicare (Managed Care) | Attending: Emergency Medicine | Admitting: Emergency Medicine

## 2021-07-08 DIAGNOSIS — T69022A Immersion foot, left foot, initial encounter: Secondary | ICD-10-CM | POA: Diagnosis not present

## 2021-07-08 DIAGNOSIS — Z5900 Homelessness unspecified: Secondary | ICD-10-CM | POA: Diagnosis not present

## 2021-07-08 DIAGNOSIS — X31XXXA Exposure to excessive natural cold, initial encounter: Secondary | ICD-10-CM | POA: Diagnosis not present

## 2021-07-08 DIAGNOSIS — M79672 Pain in left foot: Secondary | ICD-10-CM

## 2021-07-08 DIAGNOSIS — F17210 Nicotine dependence, cigarettes, uncomplicated: Secondary | ICD-10-CM | POA: Insufficient documentation

## 2021-07-08 DIAGNOSIS — M79671 Pain in right foot: Secondary | ICD-10-CM | POA: Insufficient documentation

## 2021-07-08 DIAGNOSIS — T69021A Immersion foot, right foot, initial encounter: Secondary | ICD-10-CM | POA: Insufficient documentation

## 2021-07-08 DIAGNOSIS — Y9301 Activity, walking, marching and hiking: Secondary | ICD-10-CM | POA: Diagnosis not present

## 2021-07-08 DIAGNOSIS — I10 Essential (primary) hypertension: Secondary | ICD-10-CM | POA: Insufficient documentation

## 2021-07-08 DIAGNOSIS — E119 Type 2 diabetes mellitus without complications: Secondary | ICD-10-CM | POA: Insufficient documentation

## 2021-07-08 NOTE — ED Triage Notes (Signed)
Pt states it raining outside and my feet are wet. I just need new socks and to be warm.
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
## 2021-07-08 NOTE — Discharge Instructions (Addendum)
If you are in need of a shelter please call [facility] ([facility]) at [phone] between the hours of 9am-5pm Mon-Fri.  [facility] will contact all the local shelters to find openings.  Right now they are requiring people to quarantine at a hotel before they can go to an open bed but [facility] may be able to set that up as well.  They are not open on weekends.  On Monday-Friday morning at 8 am until 1 pm, you can also go to the [facility] (see below) to seek shelter in the Hotel Quarantine Program prior to entering a shelter. You can also call the number provided (see the above paragraph) to seek placement into the program by calling [facility].   [facility] ([facility] [address] [place], [place] [address] Phone: [phone] Fax: [phone]  For Free Breakfasts and Lunches 7 days a week you can go to: [facility] [address], [place], [place] [address] Hours: Open today  8AM-5PM Phone: [phone] Breakfast: 6:30-7:30 am Lunch served: 10:40 am - 12:40pm         [facility]) M-F 8am-3pm   [address] [place], [place] [address]   [phone] Services include: laundry, barbering, support groups, case management, phone  & computer access, showers, AA/NA mtgs, mental health/substance abuse nurse, job skills class, disability information, VA assistance, spiritual classes, etc.   [facility] [address] [place], [place]   [phone] Provides breakfast each weekday morning except Wednesdays, and an evening community meal every Friday. Access to showers is available during breakfast hours and telephones for seeking work are also provided. Also offers job referral and counseling for the homeless and unemployed.  [facility] SHELTER[facility]   [facility] [address]     [facility] [place], [place] [address]     [address], [place]  [phone]      [phone]  [facility]   [facility] [address]    [address] [place] [address]     [place], [place] [address] [phone]       [phone]  [facility] (women only) [address] [place], [place] [address] [phone]  [facility] [place] (overflow shelter: [address] [place], [place] check in at 6pm for placement in local shelter).  [address], [place], [place] [address] Phone: [phone]  Crisis [facility]     [place] [facility]   Crisis Services      [place] [facility] [phone]      [phone] [address]     [address] , [place] [address]     [place],  [address]    [facility] - [phone]  

## 2021-07-08 NOTE — ED Triage Notes (Signed)
Pt states his left foot hurts. Has been raining all day and does not have shoes. Wearing socks that are wet. States he scuffed his foot on cement. Denies any other injury. Ambulatory.  Provided with clean socks in triage

## 2021-07-08 NOTE — TOC Initial Note (Signed)
Transition of Care [facility]) - Initial/Assessment Note    Patient Details  Name: Nathan Norton MRN: [id number] Date of Birth: July 30, 1981  [facility] CM/SW Contact:    Oretha Milch, LCSW Phone Number: 07/08/2021, 8:53 AM  Clinical Narrative:                 EDCSW received consult for homelessness and shoes. CSW provided patient with homeless care kit and dry socks. Patient reported he wears a size 12 shoe and notes ED provider gave patient a pair of flip flops. CSW checked and noted no size twelve or reasonable alternative sized shoe in the clothing closet. CSW will add resources to AVS. Please reach out to [facility] for additional discharge supports.         Patient Goals and CMS Choice        Expected Discharge Plan and Services                                                Prior Living Arrangements/Services                       Activities of Daily Living      Permission Sought/Granted                  Emotional Assessment              Admission diagnosis:  blisters on feet Patient Active Problem List   Diagnosis Date Noted   Schizoaffective disorder, bipolar type ([place]) 04/04/2016   HSV (herpes simplex virus) infection 10/25/2013   Schizoaffective disorder ([place]) 05/22/2013   PCP:  Patient, No Pcp Per (Inactive) Pharmacy:   [id number] [id number] [id number]: [phone]: [phone]    Social Determinants of Health (SDOH) Interventions    Readmission Risk Interventions No flowsheet data found.

## 2021-07-08 NOTE — ED Notes (Signed)
Patient verbalizes understanding of discharge instructions. Opportunity for questioning and answers were provided. Armband removed by staff, pt discharged from ED and ambulated to lobby to return home via bus 
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
## 2021-07-10 ENCOUNTER — Emergency Department (HOSPITAL_COMMUNITY)
Admission: EM | Admit: 2021-07-10 | Discharge: 2021-07-10 | Disposition: A | Discharge status: Home / Self Care | Payer: Medicare (Managed Care) | Source: Home / Self Care

## 2021-07-10 ENCOUNTER — Emergency Department (HOSPITAL_COMMUNITY)
Admission: EM | Admit: 2021-07-10 | Discharge: 2021-07-10 | Disposition: A | Discharge status: Home / Self Care | Payer: Medicare (Managed Care) | Attending: Emergency Medicine | Admitting: Emergency Medicine

## 2021-07-10 DIAGNOSIS — M79671 Pain in right foot: Secondary | ICD-10-CM | POA: Insufficient documentation

## 2021-07-10 DIAGNOSIS — E119 Type 2 diabetes mellitus without complications: Secondary | ICD-10-CM | POA: Insufficient documentation

## 2021-07-10 DIAGNOSIS — M79672 Pain in left foot: Secondary | ICD-10-CM | POA: Insufficient documentation

## 2021-07-10 DIAGNOSIS — I10 Essential (primary) hypertension: Secondary | ICD-10-CM | POA: Insufficient documentation

## 2021-07-10 DIAGNOSIS — F17210 Nicotine dependence, cigarettes, uncomplicated: Secondary | ICD-10-CM | POA: Diagnosis not present

## 2021-07-10 DIAGNOSIS — R109 Unspecified abdominal pain: Secondary | ICD-10-CM | POA: Insufficient documentation

## 2021-07-10 MED ORDER — ACETAMINOPHEN 500 MG PO TABS
1000.0000 mg | ORAL_TABLET | Freq: Once | ORAL | Status: AC
  Administered 2021-07-10: 1000 mg via ORAL
  Filled 2021-07-10: qty 2

## 2021-07-10 NOTE — Discharge Instructions (Addendum)
Take tylenol or ibuprofen for your pain, as needed.  Use as instructed on the box/bottle. Utilize the number in your discharge papers if you require assistance finding a primary doctor.
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
## 2021-07-11 ENCOUNTER — Emergency Department (HOSPITAL_COMMUNITY)
Admission: EM | Admit: 2021-07-11 | Discharge: 2021-07-12 | Disposition: A | Discharge status: Home / Self Care | Payer: Medicare (Managed Care) | Attending: Emergency Medicine | Admitting: Emergency Medicine

## 2021-07-11 ENCOUNTER — Encounter (HOSPITAL_COMMUNITY): Payer: Self-pay | Admitting: Emergency Medicine

## 2021-07-11 ENCOUNTER — Other Ambulatory Visit: Payer: Self-pay

## 2021-07-11 DIAGNOSIS — F17210 Nicotine dependence, cigarettes, uncomplicated: Secondary | ICD-10-CM | POA: Diagnosis not present

## 2021-07-11 DIAGNOSIS — I10 Essential (primary) hypertension: Secondary | ICD-10-CM | POA: Insufficient documentation

## 2021-07-11 DIAGNOSIS — Z0130 Encounter for examination of blood pressure without abnormal findings: Secondary | ICD-10-CM | POA: Diagnosis present

## 2021-07-11 DIAGNOSIS — M79673 Pain in unspecified foot: Secondary | ICD-10-CM | POA: Insufficient documentation

## 2021-07-11 DIAGNOSIS — E119 Type 2 diabetes mellitus without complications: Secondary | ICD-10-CM | POA: Insufficient documentation

## 2021-07-11 NOTE — ED Triage Notes (Addendum)
Pt reports blood pressure problems and feet pain. BP normal in triage at this time.

## 2021-07-11 NOTE — ED Provider Notes (Signed)
Emergency Medicine Provider Triage Evaluation Note  Nathan Norton , a 40 y.o. male  was evaluated in triage.  Pt complains of wanting his blood pressure checked.   Reports pain in his feet which is a chronic issue due to being homeless and walking a lot.  Review of Systems  Positive: Wants BP checked Negative: Chest pain, SOB  Physical Exam  BP 126/71 (BP Location: Left Arm)   Pulse 97   Temp 98.3 F (36.8 C) (Oral)   Resp 16   SpO2 100%  Gen:   Awake, no distress   Resp:  Normal effort  MSK:   Moves extremities without difficulty  Other:    Medical Decision Making  Medically screening exam initiated at 11:31 PM.  Appropriate orders placed.  Nathan Norton was informed that the remainder of the evaluation will be completed by another provider, this initial triage assessment does not replace that evaluation, and the importance of remaining in the ED until their evaluation is complete.  BP here 126/71.  No further work-up indicated.   [name], PA-C 07/11/21 2332    [name], MD 07/12/21 [phone]

## 2021-07-12 ENCOUNTER — Emergency Department (HOSPITAL_COMMUNITY)
Admission: EM | Admit: 2021-07-12 | Discharge: 2021-07-13 | Disposition: A | Discharge status: Home / Self Care | Payer: Medicare (Managed Care) | Source: Home / Self Care

## 2021-07-12 DIAGNOSIS — Z0130 Encounter for examination of blood pressure without abnormal findings: Secondary | ICD-10-CM | POA: Diagnosis not present

## 2021-07-12 DIAGNOSIS — Z5321 Procedure and treatment not carried out due to patient leaving prior to being seen by health care provider: Secondary | ICD-10-CM | POA: Insufficient documentation

## 2021-07-12 DIAGNOSIS — M79673 Pain in unspecified foot: Secondary | ICD-10-CM | POA: Insufficient documentation

## 2021-07-12 DIAGNOSIS — I10 Essential (primary) hypertension: Secondary | ICD-10-CM | POA: Insufficient documentation

## 2021-07-12 MED ORDER — IBUPROFEN 600 MG PO TABS: 600.0000 mg | ORAL_TABLET | Freq: Four times a day (QID) | ORAL | 0 refills | Status: DC | PRN

## 2021-07-12 NOTE — Discharge Instructions (Addendum)
Please take Tylenol or ibuprofen as needed for your aches and pain.  Use resource below to find shelters and further financial assistance.  Low blood pressure is normal today.
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
## 2021-07-12 NOTE — ED Triage Notes (Signed)
Pt c/o continued BP problems, foot pain. Seen last night for same, no additional complaints. Pt ambulatory in triage, seen walking around in lobby

## 2021-07-13 ENCOUNTER — Other Ambulatory Visit: Payer: Self-pay

## 2021-07-13 ENCOUNTER — Ambulatory Visit (HOSPITAL_COMMUNITY)
Admission: EM | Admit: 2021-07-13 | Discharge: 2021-07-13 | Disposition: A | Discharge status: Home / Self Care | Payer: Medicare (Managed Care) | Attending: Medical Oncology | Admitting: Medical Oncology

## 2021-07-13 ENCOUNTER — Encounter (HOSPITAL_COMMUNITY): Payer: Self-pay | Admitting: Emergency Medicine

## 2021-07-13 DIAGNOSIS — J302 Other seasonal allergic rhinitis: Secondary | ICD-10-CM | POA: Diagnosis not present

## 2021-07-13 MED ORDER — CETIRIZINE HCL 10 MG PO TABS: 10.0000 mg | ORAL_TABLET | Freq: Every day | ORAL | 0 refills | Status: DC
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
## 2021-07-13 NOTE — ED Triage Notes (Signed)
Here to get his zyrtec prescription refilled

## 2021-07-14 ENCOUNTER — Emergency Department (HOSPITAL_COMMUNITY)
Admission: EM | Admit: 2021-07-14 | Discharge: 2021-07-15 | Disposition: A | Discharge status: Home / Self Care | Payer: Medicare (Managed Care) | Attending: Emergency Medicine | Admitting: Emergency Medicine

## 2021-07-14 ENCOUNTER — Encounter (HOSPITAL_COMMUNITY): Payer: Self-pay | Admitting: Emergency Medicine

## 2021-07-14 DIAGNOSIS — F17210 Nicotine dependence, cigarettes, uncomplicated: Secondary | ICD-10-CM | POA: Insufficient documentation

## 2021-07-14 DIAGNOSIS — I10 Essential (primary) hypertension: Secondary | ICD-10-CM | POA: Diagnosis not present

## 2021-07-14 DIAGNOSIS — Z5900 Homelessness unspecified: Secondary | ICD-10-CM | POA: Insufficient documentation

## 2021-07-14 DIAGNOSIS — Z760 Encounter for issue of repeat prescription: Secondary | ICD-10-CM | POA: Diagnosis not present

## 2021-07-14 DIAGNOSIS — E119 Type 2 diabetes mellitus without complications: Secondary | ICD-10-CM | POA: Diagnosis not present

## 2021-07-14 NOTE — ED Triage Notes (Signed)
Pt reports he needs his medication shot. No other complaints at this time.

## 2021-07-15 ENCOUNTER — Other Ambulatory Visit: Payer: Self-pay

## 2021-07-15 ENCOUNTER — Emergency Department (HOSPITAL_COMMUNITY)
Admission: EM | Admit: 2021-07-15 | Discharge: 2021-07-15 | Disposition: A | Discharge status: Home / Self Care | Payer: Medicare (Managed Care) | Source: Home / Self Care | Attending: Emergency Medicine | Admitting: Emergency Medicine

## 2021-07-15 DIAGNOSIS — Z760 Encounter for issue of repeat prescription: Secondary | ICD-10-CM | POA: Diagnosis not present

## 2021-07-15 DIAGNOSIS — Z5321 Procedure and treatment not carried out due to patient leaving prior to being seen by health care provider: Secondary | ICD-10-CM | POA: Insufficient documentation
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
## 2021-07-15 NOTE — Discharge Instructions (Addendum)
Take your prescription to the pharmacy to get your medications.

## 2021-07-15 NOTE — ED Notes (Signed)
Pt given meal bag and drink. Ambulated out ER with a steady gait.

## 2021-07-15 NOTE — ED Triage Notes (Signed)
Patient reports needing medication refill.

## 2021-07-15 NOTE — ED Notes (Signed)
Pt left after triage after vitals were done, called pt x4 for room. No response.

## 2021-07-17 ENCOUNTER — Emergency Department (HOSPITAL_COMMUNITY)
Admission: EM | Admit: 2021-07-17 | Discharge: 2021-07-17 | Discharge status: Against Medical Advice | Payer: Medicare (Managed Care)

## 2021-07-17 ENCOUNTER — Ambulatory Visit (HOSPITAL_COMMUNITY)
Admission: EM | Admit: 2021-07-17 | Discharge: 2021-07-17 | Disposition: A | Discharge status: Home / Self Care | Payer: Medicare (Managed Care) | Attending: Nurse Practitioner | Admitting: Nurse Practitioner

## 2021-07-17 ENCOUNTER — Other Ambulatory Visit: Payer: Self-pay

## 2021-07-17 DIAGNOSIS — F259 Schizoaffective disorder, unspecified: Secondary | ICD-10-CM | POA: Insufficient documentation

## 2021-07-17 DIAGNOSIS — Z5900 Homelessness unspecified: Secondary | ICD-10-CM | POA: Diagnosis not present

## 2021-07-17 NOTE — ED Notes (Signed)
Pt discharged in no acute distress. A&O x4, ambulatory. Verbalized understanding of AVS instructions reviewed by RN. Belongings returned to pt intact. Pt escorted to front lobby by staff. Safety maintained.

## 2021-07-17 NOTE — Discharge Instructions (Addendum)
   Discharge recommendations:  Patient is to take medications as prescribed. Please see information for follow-up appointment with psychiatry and therapy. Please follow up with your primary care provider for all medical related needs.   Therapy: We recommend that patient participate in individual therapy to address mental health concerns.   Atypical antipsychotics: If you are prescribed an atypical antipsychotic, it is recommended that your height, weight, BMI, blood pressure, fasting lipid panel, and fasting blood sugar be monitored by your outpatient providers.  Safety:  The patient should abstain from use of illicit substances/drugs and abuse of any medications. If symptoms worsen or do not continue to improve or if the patient becomes actively suicidal or homicidal then it is recommended that the patient return to the closest hospital emergency department, the [facility], or call 911 for further evaluation and treatment. National Suicide Prevention Lifeline 1-800-SUICIDE or 1-800-273-8255.  

## 2021-07-17 NOTE — Progress Notes (Signed)
   07/17/21 0336  [facility] Triage Screening (Walk-ins at [facility] only)  How Did You Hear About [place]? Self  What Is the Reason for Your Visit/Call Today? Nathan Norton is a 40 year old male presenting voluntarily as a walk-in to [facility] stating "I am tired, I need to get checked by psychiatrist" Patient reported needing somewhere to rest. Patient denied SI and HI. Patient reported hearing threatening voices, but no voices to hurt self or others. During entire assessment, patient continued to ramble and tone of voice continued to get lower. 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 Patient then requested medication refill. TTS clinician and NP encouraged patient to return to [facility] during walk-in hours to establish care with a psychiatriast. Patient then requested transportation to 24 hour [facility] pharmacy so he can pick up medication prescribed to him by the ED.  How Long Has This Been Causing You Problems?  Rich Reining)  Have You Recently Had Any Thoughts About Hurting Yourself? No  Are You Planning to Commit Suicide/Harm Yourself At This time? No  Have you Recently Had Thoughts About Hurting Someone [name]? No  Are You Planning To Harm Someone At This Time? No  Are you currently experiencing any auditory, visual or other hallucinations? No  Have You Used Any Alcohol or Drugs in the Past 24 Hours? No  Do you have any current medical co-morbidities that require immediate attention? No  Clinician description of patient physical appearance/behavior: normal  What Do You Feel Would Help You the Most Today? Medication(s);Housing Assistance  If access to [facility] was not available, would you have sought care in the Emergency Department? Yes  Determination of Need Routine (7 days)  Options For Referral Outpatient Therapy;Medication Management

## 2021-07-17 NOTE — ED Provider Notes (Addendum)
[facility] Urgent [facility] Medical Screening Exam  Patient Name: Nathan Norton MRN: [id number] Date of Evaluation: 07/17/21 Chief Complaint:   Diagnosis:  Final diagnoses:  Homeless  Schizoaffective disorder, unspecified type (HCC)    History of Present illness: Nathan Norton is a 40 y.o. male with a history of schizoaffective disorder who presents voluntarily to [facility] for an evaluation. Patient states that he is homeless and has been staying at various locations.  On evaluation patient is alert and oriented. He is calm and cooperative. Speech is slow and decreased in volume. Mood is depressed and affect is congruent with mood. Thought process is coherent. Reports that he hears voices but is unable to describe. States that he see images in clouds. No indication that patient is responding to internal stimuli. No evidence of delusional thought content. Denies suicidal ideations. Denies homicidal ideations. Denies substance abuse. Patient states that he was previously prescribed Abilify and was receiving Abilify LAI. He is unable to recall when he received his injection. States that he does not have a current psychiatric provider. Discussed returning to [facility] during walk-in hours to establish care with a psychiatric provider. Patient requests transportation to 24 hour pharmacy so he can pick up meds prescribed in  the ED.   Psychiatric Specialty Exam  Presentation  General Appearance:Fairly Groomed  Eye Contact:Good  Speech:Slow  Speech Volume:Decreased  Handedness:Right   Mood and Affect  Mood:Depressed  Affect:Congruent   Thought Process  Thought Processes:Coherent  Descriptions of Associations:Intact  Orientation:Full (Time, Place and Person)  Thought Content:Logical  Diagnosis of Schizophrenia or Schizoaffective disorder in past: Yes  Duration of Psychotic Symptoms: Greater than six months  Hallucinations:None  Ideas of Reference:None  Suicidal  Thoughts:No  Homicidal Thoughts:No   Sensorium  Memory:Immediate Good; Recent Fair  Judgment:Fair  Insight:Fair   Executive Functions  Concentration:Fair  Attention Span:Fair  Recall:Fair  Fund of Knowledge:Fair  Language:Fair   Psychomotor Activity  Psychomotor Activity:Normal   Assets  Assets:Desire for Improvement; Physical Health   Sleep  Sleep:Fair  Number of hours:  No data recorded  No data recorded  Physical Exam: Physical Exam Constitutional:      General: He is not in acute distress.    Appearance: He is not ill-appearing, toxic-appearing or diaphoretic.  HENT:     Head: Normocephalic.     Right Ear: External ear normal.     Left Ear: External ear normal.  Eyes:     Pupils: Pupils are equal, round, and reactive to light.  Cardiovascular:     Rate and Rhythm: Normal rate.  Pulmonary:     Effort: Pulmonary effort is normal. No respiratory distress.  Musculoskeletal:        General: Normal range of motion.  Neurological:     Mental Status: He is alert and oriented to person, place, and time.  Psychiatric:        Mood and Affect: Mood is depressed.        Speech: Speech normal.        Behavior: Behavior is cooperative.        Thought Content: Thought content is not paranoid or delusional. Thought content does not include homicidal or suicidal ideation. Thought content does not include suicidal plan.   Review of Systems  Constitutional:  Negative for chills, diaphoresis, fever, malaise/fatigue and weight loss.  HENT:  Negative for congestion.   Respiratory:  Negative for cough and shortness of breath.   Cardiovascular:  Negative for chest pain and palpitations.  Gastrointestinal:  Negative for diarrhea, nausea and vomiting.  Neurological:  Negative for dizziness.  Psychiatric/Behavioral:  Positive for depression and hallucinations. Negative for memory loss and suicidal ideas. The patient is nervous/anxious and has insomnia.   All other  systems reviewed and are negative.  Blood pressure 127/84, pulse 94, temperature 98.2 F (36.8 C), resp. rate 18, SpO2 99 %. There is no height or weight on file to calculate BMI.  Musculoskeletal: Strength & Muscle Tone: within normal limits Gait & Station: normal Patient leans: N/A   [facility] MSE Discharge Disposition for Follow up and Recommendations: Based on my evaluation the patient does not appear to have an emergency medical condition and can be discharged with resources and follow up care in outpatient services for Medication Management and Individual Therapy   Jackelyn Poling, NP 07/17/2021, 5:19 AM

## 2021-07-18 ENCOUNTER — Emergency Department (HOSPITAL_COMMUNITY)
Admission: EM | Admit: 2021-07-18 | Discharge: 2021-07-18 | Disposition: A | Discharge status: Home / Self Care | Payer: Medicare (Managed Care) | Attending: Emergency Medicine | Admitting: Emergency Medicine

## 2021-07-18 DIAGNOSIS — M79672 Pain in left foot: Secondary | ICD-10-CM | POA: Insufficient documentation

## 2021-07-18 DIAGNOSIS — I10 Essential (primary) hypertension: Secondary | ICD-10-CM | POA: Insufficient documentation

## 2021-07-18 DIAGNOSIS — E119 Type 2 diabetes mellitus without complications: Secondary | ICD-10-CM | POA: Insufficient documentation

## 2021-07-18 DIAGNOSIS — S40869A Insect bite (nonvenomous) of unspecified upper arm, initial encounter: Secondary | ICD-10-CM | POA: Insufficient documentation

## 2021-07-18 DIAGNOSIS — F17210 Nicotine dependence, cigarettes, uncomplicated: Secondary | ICD-10-CM | POA: Insufficient documentation

## 2021-07-18 DIAGNOSIS — M79671 Pain in right foot: Secondary | ICD-10-CM | POA: Insufficient documentation

## 2021-07-18 DIAGNOSIS — W57XXXA Bitten or stung by nonvenomous insect and other nonvenomous arthropods, initial encounter: Secondary | ICD-10-CM | POA: Insufficient documentation

## 2021-07-18 NOTE — ED Triage Notes (Signed)
Here for bilateral foot pain.

## 2021-07-18 NOTE — Discharge Instructions (Addendum)
Thank you for allowing me to care for you today in the Emergency Department.   You can follow-up with the resources above if you continue to have foot pain.  You can take Tylenol, which is available over-the-counter.  Return to the emergency department for new or worsening symptoms.
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
## 2021-07-19 ENCOUNTER — Emergency Department (HOSPITAL_COMMUNITY)
Admission: EM | Admit: 2021-07-19 | Discharge: 2021-07-19 | Disposition: A | Discharge status: Home / Self Care | Payer: Medicare (Managed Care) | Attending: Emergency Medicine | Admitting: Emergency Medicine

## 2021-07-19 ENCOUNTER — Encounter (HOSPITAL_COMMUNITY): Payer: Self-pay | Admitting: Emergency Medicine

## 2021-07-19 ENCOUNTER — Ambulatory Visit (INDEPENDENT_AMBULATORY_CARE_PROVIDER_SITE_OTHER)
Admission: EM | Admit: 2021-07-19 | Discharge: 2021-07-19 | Disposition: A | Discharge status: Home / Self Care | Payer: Medicare (Managed Care) | Source: Home / Self Care

## 2021-07-19 ENCOUNTER — Emergency Department (HOSPITAL_COMMUNITY)
Admission: EM | Admit: 2021-07-19 | Discharge: 2021-07-19 | Disposition: A | Discharge status: Home / Self Care | Payer: Medicare (Managed Care) | Source: Home / Self Care | Attending: Emergency Medicine | Admitting: Emergency Medicine

## 2021-07-19 ENCOUNTER — Other Ambulatory Visit: Payer: Self-pay

## 2021-07-19 DIAGNOSIS — Z9151 Personal history of suicidal behavior: Secondary | ICD-10-CM | POA: Insufficient documentation

## 2021-07-19 DIAGNOSIS — R5383 Other fatigue: Secondary | ICD-10-CM | POA: Insufficient documentation

## 2021-07-19 DIAGNOSIS — E119 Type 2 diabetes mellitus without complications: Secondary | ICD-10-CM | POA: Insufficient documentation

## 2021-07-19 DIAGNOSIS — S40861A Insect bite (nonvenomous) of right upper arm, initial encounter: Secondary | ICD-10-CM | POA: Diagnosis not present

## 2021-07-19 DIAGNOSIS — W57XXXA Bitten or stung by nonvenomous insect and other nonvenomous arthropods, initial encounter: Secondary | ICD-10-CM

## 2021-07-19 DIAGNOSIS — F259 Schizoaffective disorder, unspecified: Secondary | ICD-10-CM | POA: Diagnosis not present

## 2021-07-19 DIAGNOSIS — Z765 Malingerer [conscious simulation]: Secondary | ICD-10-CM | POA: Diagnosis not present

## 2021-07-19 DIAGNOSIS — Z8659 Personal history of other mental and behavioral disorders: Secondary | ICD-10-CM | POA: Insufficient documentation

## 2021-07-19 DIAGNOSIS — Z5900 Homelessness unspecified: Secondary | ICD-10-CM | POA: Insufficient documentation

## 2021-07-19 DIAGNOSIS — S40862A Insect bite (nonvenomous) of left upper arm, initial encounter: Secondary | ICD-10-CM | POA: Insufficient documentation

## 2021-07-19 DIAGNOSIS — I10 Essential (primary) hypertension: Secondary | ICD-10-CM | POA: Insufficient documentation

## 2021-07-19 DIAGNOSIS — F17210 Nicotine dependence, cigarettes, uncomplicated: Secondary | ICD-10-CM | POA: Diagnosis not present

## 2021-07-19 DIAGNOSIS — S4991XA Unspecified injury of right shoulder and upper arm, initial encounter: Secondary | ICD-10-CM | POA: Diagnosis present

## 2021-07-19 NOTE — Discharge Instructions (Signed)

## 2021-07-19 NOTE — ED Provider Notes (Signed)
Behavioral Health Urgent Care Medical Screening Exam  Patient Name: Nathan Norton MRN: [id number] Date of Evaluation: 07/19/21 Chief Complaint:  "I'm hungry, I'm thirsty, and I lost my EBT card".  Diagnosis:  Final diagnoses:  Schizoaffective disorder, unspecified type (HCC)  Homelessness    History of Present illness: Nathan Norton is a 40 y.o. male with past psychiatric history significant for schizoaffective disorder who presents to [facility] as a voluntary walk-in.  When patient is asked why he came to [facility], patient states he came to [facility] because "I'm hungry, I'm thirsty, and I lost my EBT card".  Per chart review, prior to patient coming to  [facility] this morning, patient presented to [facility] emergency department earlier this morning on 07/19/2021 for concern regarding insect bites and patient was medically cleared and discharged from the emergency department.  Chart review also shows that patient presented to [facility] on 07/17/2021 and was discharged with outpatient psychiatric resources.   Patient denies SI on exam.  When patient is asked about history of past suicide attempts, patient states "that's irrelevant."  And does not provide further details regarding past suicide attempt history.  Patient denies history of self-injurious behavior via intentionally cutting or burning himself.  Patient denies AVH on exam.  Patient does endorse chronic history of auditory hallucinations since he was a child.  Patient describes his auditory hallucinations as hearing "spiritual communication and godly things".  Patient is unable to provide details regarding when the last time was that he experienced auditory hallucinations.  Patient denies visual hallucinations.  Patient denies paranoia or delusions.  Patient reports sleeping poorly due to being homeless.  When patient is asked how many hours he sleeps per night, patient states "not many" and does not provide further details regarding quantity of sleep.  Patient  denies anhedonia or feelings of guilt, hopelessness, or worthlessness.  Patient endorses fatigue.  Patient denies concentration/focus changes.  Patient reports decreased appetite and weight loss but patient is unable to quantify how much weight he has lost over the past few months.  Patient states that he does not currently have a psychiatric medication management provider or therapist.  Patient states that he is not currently taking any psychotropic medications.  Patient reports that he took Abilify in the past.  Patient states that he thinks that his Abilify was either 5 mg or 10 mg.  When patient is asked to prescribe his Abilify in the past, patient states "somebody named Onalee Hua" and is unable to provide further details regarding this provider.  Patient states that he has not taken any psychotropic medications "for a while".  Patient reports history of multiple inpatient psychiatric hospitalizations at [facility] but does not state when these hospitalizations occurred.  Per chart review, patient was admitted to [facility] from 05/22/2013 to 05/26/2013 and was also admitted to [facility] from 03/17/2010 to 03/22/2010.   Patient is currently homeless.  Patient denies access to firearms or weapons.  Patient reports drinking alcohol "every now and then".  Patient is unable to provide details regarding frequency or quantity of alcohol use.  Patient states that he does not remember when he last drank alcohol.  Patient denies history of alcohol/substance withdrawal symptoms or seizures.  Patient denies tobacco or illicit drug use.    On exam, patient is sitting comfortably in no acute distress.  Patient's mood is euthymic with congruent affect.  Patient is pleasant, cooperative, alert and oriented x4, and answers all questions appropriately.  Patient answers certain questions inappropriately at times throughout the assessment, but based on chart review, this type of  behavior appears to most likely be chronic/baseline for the patient.  No indication that patient is responding to internal or external stimuli on exam.  Psychiatric Specialty Exam  Presentation  General Appearance:Appropriate for Environment; Fairly Groomed  Eye Contact:Good  Speech:Clear and Coherent; Normal Rate  Speech Volume:Normal  Handedness:Right   Mood and Affect  Mood:Euthymic  Affect:Congruent   Thought Process  Thought Processes:Coherent  Descriptions of Associations:Intact  Orientation:Full (Time, Place and Person)  Thought Content:WDL  Diagnosis of Schizophrenia or Schizoaffective disorder in past: Yes  Duration of Psychotic Symptoms: Greater than six months  Hallucinations:None  Ideas of Reference:None  Suicidal Thoughts:No  Homicidal Thoughts:No   Sensorium  Memory:Immediate Fair; Recent Fair; Remote Fair  Judgment:Fair  Insight:Fair   Executive Functions  Concentration:Fair  Attention Span:Fair  Recall:Fair  Fund of Knowledge:Fair  Language:Fair   Psychomotor Activity  Psychomotor Activity:Normal   Assets  Assets:Communication Skills; Desire for Improvement; Financial Resources/Insurance; Leisure Time; Physical Health   Sleep  Sleep:Poor  Number of hours:  No data recorded  No data recorded  Physical Exam: Physical Exam Vitals reviewed.  Constitutional:      General: He is not in acute distress.    Appearance: He is not ill-appearing, toxic-appearing or diaphoretic.  HENT:     Head: Normocephalic and atraumatic.     Right Ear: External ear normal.     Left Ear: External ear normal.     Nose: Nose normal.  Eyes:     General:        Right eye: No discharge.        Left eye: No discharge.     Conjunctiva/sclera: Conjunctivae normal.  Cardiovascular:     Rate and Rhythm: Normal rate.  Pulmonary:     Effort: Pulmonary effort is normal. No respiratory distress.  Musculoskeletal:        General: Normal range of  motion.     Cervical back: Normal range of motion.  Neurological:     General: No focal deficit present.     Mental Status: He is alert and oriented to person, place, and time.     Comments: No tremor noted.   Psychiatric:        Attention and Perception: He does not perceive auditory or visual hallucinations.        Mood and Affect: Mood and affect normal.        Speech: Speech normal.        Behavior: Behavior is not agitated, slowed, aggressive, withdrawn, hyperactive or combative. Behavior is cooperative.        Thought Content: Thought content is not paranoid or delusional. Thought content does not include homicidal or suicidal ideation.   Review of Systems  Constitutional:  Positive for malaise/fatigue and weight loss. Negative for chills, diaphoresis and fever.  HENT:  Negative for congestion.   Respiratory:  Negative for cough and shortness of breath.   Cardiovascular:  Negative for chest pain and palpitations.  Gastrointestinal:  Negative for abdominal pain, constipation, diarrhea, nausea and vomiting.  Neurological:  Negative for dizziness, tremors, seizures and headaches.  Psychiatric/Behavioral:  Negative for depression, memory loss, substance abuse and suicidal ideas. The patient has insomnia. The patient is not nervous/anxious.        Patient denies AVH on exam.  Patient does endorse chronic history of auditory hallucinations since he was a child.  Patient describes his  auditory hallucinations as hearing "spiritual communication and godly things".  Patient is unable to provide details regarding when the last time was that he experienced auditory hallucinations. He denies visual hallucinations.   All other systems reviewed and are negative.  Vitals: Blood pressure 123/79, pulse 83, temperature 98.5 F (36.9 C), temperature source Oral, resp. rate 20, SpO2 97 %. There is no height or weight on file to calculate BMI.  Musculoskeletal: Strength & Muscle Tone: within normal  limits Gait & Station: normal Patient leans: N/A   [facility] MSE Discharge Disposition for Follow up and Recommendations: Based on my evaluation the patient does not appear to have an emergency medical condition and can be discharged with resources and follow up care in outpatient services for Medication Management, Individual Therapy, and Group Therapy  Patient denies SI, HI, AVH, paranoia, or delusions.  Patient is not psychotic on exam.  Patient is not an Advertising account executive risk/threat to herself or others.  Patient does not meet inpatient psychiatric treatment criteria or [facility] behavioral health urgent care continuous assessment criteria.  Recommend that patient become established with an outpatient psychiatrist for psychotropic medication management and/or therapist.  Patient is a [facility] resident, but has [employer].  Thus, patient is not a candidate for [facility] walk-in/open access hours at this time.  Resource sheet containing outpatient [facility] psychiatry and therapy facilities provided to patient for patient to utilize to schedule psychiatry and/or therapy appointments.  Resources for homeless shelters and resources for food banks provided to patient as well.  Safety planning done at length with the patient regarding appropriate actions to take/resources to utilize [facility], 911, nearest ED, suicide prevention Lifeline) if patient becomes suicidal or homicidal, if patient's condition rapidly deteriorates/worsen/does not improve, or if patient begins to experience a mental health crisis.   Patient verbalizes understanding and agreement of the overall plan and recommendations, including safety plan. Patient verbally contracts for safety with this Clinical research associate.  Patient states that if he is to be discharged, he will not try to harm or kill himself All patient's questions answered and concerns addressed. Patient discharged with bus pass to destination  of his choice.  Jaclyn Shaggy, PA-C 07/19/2021, 4:11 AM

## 2021-07-19 NOTE — Discharge Instructions (Addendum)
Please follow-up with primary care provider for further evaluation.

## 2021-07-19 NOTE — BH Assessment (Addendum)
Pt came in from [facility].  He still had a blood pressure cuff affixed to his right arm.  Pt says he is hungry, thirsty and homeless.  Pt was seen on 07/18 at [facility] and was encouraged to come to [facility] during walk in hours.  He said he did not because of transportation.  Pt gives some inappropriate responses to questions at times but this is probably his baseline.  Patient denies any SI or HI.   He says he hears voices that is "spiritual communication, godly things."  Patient denies using of ETOH or other substances.  Pt does not meet criteria for a stayat [facility].  Pt was given AVS and resources for homeless shelters and food banks.
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
## 2021-07-19 NOTE — ED Triage Notes (Signed)
Insect bites all over, reports of itching.

## 2021-07-19 NOTE — Discharge Instructions (Addendum)
Keep the area clean. They will heal over time.

## 2021-07-19 NOTE — ED Triage Notes (Signed)
Patient reports of being bit by a dog on right lower leg and right upper arm.  No bites noted to either of these areas.  No bleeding noted.

## 2021-07-20 ENCOUNTER — Other Ambulatory Visit: Payer: Self-pay

## 2021-07-20 ENCOUNTER — Emergency Department (HOSPITAL_COMMUNITY)
Admission: EM | Admit: 2021-07-20 | Discharge: 2021-07-21 | Disposition: A | Discharge status: Home / Self Care | Payer: Medicare (Managed Care) | Attending: Emergency Medicine | Admitting: Emergency Medicine

## 2021-07-20 DIAGNOSIS — L509 Urticaria, unspecified: Secondary | ICD-10-CM | POA: Diagnosis not present

## 2021-07-20 DIAGNOSIS — W57XXXA Bitten or stung by nonvenomous insect and other nonvenomous arthropods, initial encounter: Secondary | ICD-10-CM | POA: Insufficient documentation

## 2021-07-20 DIAGNOSIS — Z5321 Procedure and treatment not carried out due to patient leaving prior to being seen by health care provider: Secondary | ICD-10-CM | POA: Insufficient documentation

## 2021-07-20 DIAGNOSIS — Z202 Contact with and (suspected) exposure to infections with a predominantly sexual mode of transmission: Secondary | ICD-10-CM | POA: Diagnosis not present

## 2021-07-20 NOTE — ED Notes (Signed)
Pt called 4 times no answer

## 2021-07-20 NOTE — ED Notes (Signed)
Difficulties finding pt for triage was called several times

## 2021-07-20 NOTE — ED Provider Notes (Signed)
Emergency Medicine Provider Triage Evaluation Note  Nathan Norton , a 40 y.o. male  was evaluated in triage.  Pt complains of insect bites.  Wants to make certain they aren't scabies or chicken pox.  States they are itchy and sometimes drain pus.  Has applied alcohol without relief.  Review of Systems  Positive: Insect bites, itching Negative: Fever, chills  Physical Exam  Ht 6\' 3"  (1.905 m)   Wt 90.7 kg   BMI 25.00 kg/m  Gen:   Awake, no distress   Resp:  Normal effort  MSK:   Moves extremities without difficulty  Other:  Scatter bites  Medical Decision Making  Medically screening exam initiated at 11:53 PM.  Appropriate orders placed.  Nathan Norton was informed that the remainder of the evaluation will be completed by another provider, this initial triage assessment does not replace that evaluation, and the importance of remaining in the ED until their evaluation is complete.  Insect bite   Blanchard Kelch, Roxy Horseman 07/20/21 2354    07/22/21, MD 07/21/21 [phone]

## 2021-07-20 NOTE — ED Triage Notes (Addendum)
Pt c/o several insect bites "all over". Described as itchy and burning. Denies pain

## 2021-07-20 NOTE — ED Notes (Signed)
Pt seen walking in and out leaving for long periods of time

## 2021-07-20 NOTE — ED Notes (Signed)
Pt called 2x no anwer

## 2021-07-21 ENCOUNTER — Emergency Department (HOSPITAL_COMMUNITY)
Admission: EM | Admit: 2021-07-21 | Discharge: 2021-07-22 | Disposition: A | Discharge status: Home / Self Care | Payer: Medicare (Managed Care) | Source: Home / Self Care

## 2021-07-21 DIAGNOSIS — Z202 Contact with and (suspected) exposure to infections with a predominantly sexual mode of transmission: Secondary | ICD-10-CM | POA: Insufficient documentation

## 2021-07-21 DIAGNOSIS — Z5321 Procedure and treatment not carried out due to patient leaving prior to being seen by health care provider: Secondary | ICD-10-CM | POA: Insufficient documentation

## 2021-07-21 DIAGNOSIS — R21 Rash and other nonspecific skin eruption: Secondary | ICD-10-CM | POA: Insufficient documentation

## 2021-07-21 DIAGNOSIS — L509 Urticaria, unspecified: Secondary | ICD-10-CM | POA: Diagnosis not present

## 2021-07-21 NOTE — ED Provider Notes (Signed)
Emergency Medicine Provider Triage Evaluation Note  Tyjai Matuszak , a 40 y.o. male  was evaluated in triage.  Pt complains of wanting to be healed and looking for community services.  He has been seen multiple times this week for the same.  When asked if there is anything different, he reports he has a rash on his elbows.  On evaluation, this looks like bug bites.  He states they itch.  Review of Systems  Positive: rash Negative: fever  Physical Exam  BP 130/76   Pulse 89   Temp 98.1 F (36.7 C)   Resp 14   SpO2 98%  Gen:   Awake, no distress   Resp:  Normal effort  MSK:   Moves extremities without difficulty  Other:  Rash c/w bug bites of R elbow and L forearm  Medical Decision Making  Medically screening exam initiated at 2:37 PM.  Appropriate orders placed.  Cola Highfill was informed that the remainder of the evaluation will be completed by another provider, this initial triage assessment does not replace that evaluation, and the importance of remaining in the ED until their evaluation is complete.     Alveria Apley, PA-C 07/21/21 1441    Cheryll Cockayne, MD 07/22/21 [phone]

## 2021-07-21 NOTE — ED Triage Notes (Signed)
Pt arrives with multiple complaints: crust on the bottom of his feet, dark toenails, and wanting a shot to treat a STD because he has bumps all over his body. Has been seen six times already this week.

## 2021-07-21 NOTE — ED Notes (Signed)
Pt called 5 times

## 2021-07-21 NOTE — ED Notes (Signed)
Called patient to take him back to a treatment room and he stated he didn't want to go back he was leaving

## 2021-07-21 NOTE — ED Notes (Signed)
Pt called 2x additional times for triage w/o response. Pt doesn't appear to be here any longer.

## 2021-07-21 NOTE — ED Notes (Signed)
Pt refused to be roomed stated he had already been seen by the doctor and left.

## 2021-07-22 ENCOUNTER — Encounter (HOSPITAL_COMMUNITY): Payer: Self-pay | Admitting: Emergency Medicine

## 2021-07-22 ENCOUNTER — Emergency Department (HOSPITAL_COMMUNITY)
Admission: EM | Admit: 2021-07-22 | Discharge: 2021-07-22 | Disposition: A | Discharge status: Home / Self Care | Payer: Medicare (Managed Care) | Source: Home / Self Care

## 2021-07-22 ENCOUNTER — Emergency Department (HOSPITAL_COMMUNITY)
Admission: EM | Admit: 2021-07-22 | Discharge: 2021-07-22 | Disposition: A | Discharge status: Home / Self Care | Payer: Medicare (Managed Care) | Attending: Emergency Medicine | Admitting: Emergency Medicine

## 2021-07-22 DIAGNOSIS — F17210 Nicotine dependence, cigarettes, uncomplicated: Secondary | ICD-10-CM | POA: Insufficient documentation

## 2021-07-22 DIAGNOSIS — Z5321 Procedure and treatment not carried out due to patient leaving prior to being seen by health care provider: Secondary | ICD-10-CM | POA: Diagnosis not present

## 2021-07-22 DIAGNOSIS — E119 Type 2 diabetes mellitus without complications: Secondary | ICD-10-CM | POA: Insufficient documentation

## 2021-07-22 DIAGNOSIS — I10 Essential (primary) hypertension: Secondary | ICD-10-CM | POA: Insufficient documentation

## 2021-07-22 DIAGNOSIS — T730XXA Starvation, initial encounter: Secondary | ICD-10-CM | POA: Insufficient documentation

## 2021-07-22 DIAGNOSIS — Z5900 Homelessness unspecified: Secondary | ICD-10-CM

## 2021-07-22 DIAGNOSIS — X58XXXA Exposure to other specified factors, initial encounter: Secondary | ICD-10-CM | POA: Diagnosis not present

## 2021-07-22 NOTE — ED Notes (Signed)
Discharged by PA at triage. 

## 2021-07-22 NOTE — ED Triage Notes (Signed)
Denies any medical complaints.  States he is here for bus passes and food stamps.

## 2021-07-22 NOTE — ED Triage Notes (Signed)
Returns for 2nd time today requesting food stamps.  PA gave pt a [place] sandwich.
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
## 2021-07-22 NOTE — Discharge Instructions (Signed)
If you are in need of a shelter please call [facility] ([facility]) at [phone] between the hours of 9am-5pm Mon-Fri.  [facility] will contact all the local shelters to find openings.  Right now they are requiring people to quarantine at a hotel before they can go to an open bed but [facility] may be able to set that up as well.  They are not open on weekends.  On Monday-Friday morning at 8 am until 1 pm, you can also go to the [facility] (see below) to seek shelter in the Hotel Quarantine Program prior to entering a shelter. You can also call the number provided (see the above paragraph) to seek placement into the program by calling [facility].   [facility] ([facility]) [address] [place], [place] [address] Phone: [phone] Fax: [phone]  For Free Breakfasts and Lunches 7 days a week you can go to: [facility] [address], [place], [place] [address] Hours: Open today  8AM-5PM Phone: [phone] Breakfast: 6:30-7:30 am Lunch served: 10:40 am - 12:40pm         [facility] ([facility]) M-F 8am-3pm   [address] [place], [place] [address]   [phone] Services include: laundry, barbering, support groups, case management, phone  & computer access, showers, AA/NA mtgs, mental health/substance abuse nurse, job skills class, disability information, VA assistance, spiritual classes, etc.   [facility] [address] [place], [place]   [phone] Provides breakfast each weekday morning except Wednesdays, and an evening community meal every Friday. Access to showers is available during breakfast hours and telephones for seeking work are also provided. Also offers job referral and counseling for the homeless and unemployed.  HOMELESS SHELTERS [facility]   [facility] [address]     [facility] [place], [place] [address]     [address], [place]  [phone]      [phone]  [facility]   [facility] [address]    [address] [place] [address]     [place], [place] [address] [phone]       [phone]  [facility] (women only) [address] [place], [place] [address] [phone]  [facility]: [place] (overflow shelter: [address] [place], [place] check in at 6pm for placement in local shelter).  [address], [place], [place] [address] Phone: [phone]  Crisis Services [facility]     [place] [facility]   Crisis Services      [place] [facility] [phone]      [phone] [address]     [address] [place], [place] [address]     [place], [place] [address]    [facility] Crisis [facility] - [phone]  

## 2021-07-22 NOTE — ED Provider Notes (Signed)
Emergency Medicine Provider Triage Evaluation Note  Nathan Norton , a 40 y.o. male  was evaluated in triage.  Pt complains of being hungry and requesting resources. Here multiple times this week for the same. Left without being seen. No other complaints today  Review of Systems  Positive: hungry Negative: pain  Physical Exam  There were no vitals taken for this visit. Gen:   Awake, no distress   Resp:  Normal effort  MSK:   Moves extremities without difficulty    Medical Decision Making  Medically screening exam initiated at 11:31 AM.  Appropriate orders placed.  Jajuan Skoog was informed that the remainder of the evaluation will be completed by another provider, this initial triage assessment does not replace that evaluation, and the importance of remaining in the ED until their evaluation is complete.     Alveria Apley, PA-C 07/22/21 1133    Cheryll Cockayne, MD 07/24/21 [phone]

## 2021-07-22 NOTE — Progress Notes (Signed)
CSW received consult. CSW attempted to locate patient in the waiting room and patient was not present. CSW checked patient's assigned [id number] and noted he had not made it there. CSW left bus pass and homeless shelter resources at nursing station by [phone] for patient.

## 2021-07-24 ENCOUNTER — Other Ambulatory Visit: Payer: Self-pay

## 2021-07-24 ENCOUNTER — Ambulatory Visit (HOSPITAL_COMMUNITY)
Admission: EM | Admit: 2021-07-24 | Discharge: 2021-07-24 | Disposition: A | Discharge status: Home / Self Care | Payer: Medicare (Managed Care)

## 2021-07-24 ENCOUNTER — Encounter (HOSPITAL_COMMUNITY): Payer: Self-pay | Admitting: Emergency Medicine

## 2021-07-24 ENCOUNTER — Emergency Department (HOSPITAL_COMMUNITY)
Admission: EM | Admit: 2021-07-24 | Discharge: 2021-07-24 | Disposition: A | Discharge status: Home / Self Care | Payer: Medicare (Managed Care) | Attending: Emergency Medicine | Admitting: Emergency Medicine

## 2021-07-24 DIAGNOSIS — G8929 Other chronic pain: Secondary | ICD-10-CM | POA: Diagnosis not present

## 2021-07-24 DIAGNOSIS — R21 Rash and other nonspecific skin eruption: Secondary | ICD-10-CM | POA: Diagnosis not present

## 2021-07-24 DIAGNOSIS — M79672 Pain in left foot: Secondary | ICD-10-CM | POA: Diagnosis not present

## 2021-07-24 DIAGNOSIS — A64 Unspecified sexually transmitted disease: Secondary | ICD-10-CM | POA: Insufficient documentation

## 2021-07-24 DIAGNOSIS — Z5321 Procedure and treatment not carried out due to patient leaving prior to being seen by health care provider: Secondary | ICD-10-CM | POA: Diagnosis not present

## 2021-07-24 DIAGNOSIS — M79671 Pain in right foot: Secondary | ICD-10-CM | POA: Diagnosis not present

## 2021-07-24 NOTE — Progress Notes (Signed)
Pt left facility after being triaged and before being seen by a provider.

## 2021-07-24 NOTE — ED Triage Notes (Signed)
Patient presents with multiple complaints : chronic feet pain , dry skin Hazle Nordmann and requesting STD "shot" with no symptoms .

## 2021-07-24 NOTE — Progress Notes (Signed)
   07/24/21 1415  [facility] Triage Screening (Walk-ins at [facility] only)  How Did You Hear About [place]? Self  What Is the Reason for Your Visit/Call Today? Patient present to [facility] stating he needs his blood pressure checked. When asked why are you here today patient discussed needing his food stamp card. Patient denied SI/HI and AVH. Patient refused to sign Waiver of Medical Screening Exam and left the facility before being seen by a provider.  How Long Has This Been Causing You Problems?  (UTA)  Have You Recently Had Any Thoughts About Hurting Yourself? No  Are You Planning to Commit Suicide/Harm Yourself At This time? No  Have you Recently Had Thoughts About Hurting Someone [name]? No  Are You Planning To Harm Someone At This Time? No  Are you currently experiencing any auditory, visual or other hallucinations? No  Have You Used Any Alcohol or Drugs in the Past 24 Hours? No  Do you have any current medical co-morbidities that require immediate attention? No  If access to [facility] Urgent Care was not available, would you have sought care in the Emergency Department? No  Determination of Need Routine (7 days)

## 2021-07-24 NOTE — ED Notes (Signed)
Security alerted this NT that this patient was found in the [employer]

## 2021-07-24 NOTE — ED Notes (Signed)
Called pt 3x no answer pt removed off the floor

## 2021-07-24 NOTE — ED Provider Notes (Signed)
Emergency Medicine Provider Triage Evaluation Note  Ranjit Ashurst , a 40 y.o. male  was evaluated in triage.  Pt complains of bilateral foot pain and irritation.  Reports this is been going on for a while.  Also complains of rash for his entire life.  No fevers or chills.  Review of Systems  Positive: Foot pain Negative: Fever, chills  Physical Exam  There were no vitals taken for this visit. Gen:   Awake, no distress   Resp:  Normal effort  MSK:   Moves extremities without difficulty  Other:  Feet are scaly and red  Medical Decision Making  Medically screening exam initiated at 1:36 AM.  Appropriate orders placed.  Chasen Mendell was informed that the remainder of the evaluation will be completed by another provider, this initial triage assessment does not replace that evaluation, and the importance of remaining in the ED until their evaluation is complete.  Foot pain   Jameisha Stofko, Boyd Kerbs 07/24/21 5300    Glynn Octave, MD 07/24/21 0201

## 2021-07-25 ENCOUNTER — Ambulatory Visit (HOSPITAL_COMMUNITY)
Admission: EM | Admit: 2021-07-25 | Discharge: 2021-07-25 | Disposition: A | Discharge status: Home / Self Care | Payer: Medicare (Managed Care) | Attending: Psychiatry | Admitting: Psychiatry

## 2021-07-25 DIAGNOSIS — F259 Schizoaffective disorder, unspecified: Secondary | ICD-10-CM | POA: Insufficient documentation

## 2021-07-25 DIAGNOSIS — Z5900 Homelessness unspecified: Secondary | ICD-10-CM | POA: Insufficient documentation

## 2021-07-25 DIAGNOSIS — R440 Auditory hallucinations: Secondary | ICD-10-CM | POA: Diagnosis present

## 2021-07-25 NOTE — Discharge Instructions (Signed)

## 2021-07-25 NOTE — Progress Notes (Signed)
TRIAGE: ROUTINE  Nathan Norton is a 40 year old patient who voluntarily came to the [facility] Urgent [facility]). Pt denies SI, HI, NSSIB, access to guns, and SA. Pt states he experiences AVH in the form of "haters." He states he has upcoming court dates because he can't get his food stamps or his disability.   07/25/21 0140  [facility] Triage Screening (Walk-ins at [facility] only)  How Did You Hear About [place]? Self  What Is the Reason for Your Visit/Call Today? Pt states he is in need of multiple things, including but not limited to shoes, clothes, food, and medication. Pt states he has an infection in both of his feet from walking too much. Pt denies he utilized the resources provided him the last time he was at the [facility] and, thus, does not have a therapist or a psychiatrist.  How Long Has This Been Causing You Problems? 1 wk - 1 month  Have You Recently Had Any Thoughts About Hurting Yourself? No  Are You Planning to Commit Suicide/Harm Yourself At This time? No  Have you Recently Had Thoughts About Hurting Someone [name]? No  Are You Planning To Harm Someone At This Time? No  Please explain the hallucinations you are currently experiencing: Pt initially states he hears and sees objects, but when asked to specify about what he sees and hears, he replies, "haters." Clinician inquired as to whether she'd be able to see the haters if she was with pt and he replied, "yes."  Have You Used Any Alcohol or Drugs in the Past 24 Hours? No  Do you have any current medical co-morbidities that require immediate attention? No  Clinician description of patient physical appearance/behavior: Pt is discheveled and in dirty clohes. Pt answers most questions posed, but, at times, provides information to questions not asked.  What Do You Feel Would Help You the Most Today? Financial Resources;Food Assistance;Housing Assistance;Social Support;Transportation Assistance;Medication(s)  If access to [facility] was  not available, would you have sought care in the Emergency Department? Yes  Determination of Need Routine (7 days)  Options For Referral Other: Comment (Psych clear with resources)

## 2021-07-25 NOTE — ED Provider Notes (Signed)
Behavioral Health Urgent Care Medical Screening Exam  Patient Name: Nathan Norton MRN: [id number] Date of Evaluation: 07/25/21 Chief Complaint:  "I'm frustrated." Diagnosis:  Final diagnoses:  Schizoaffective disorder, unspecified type (HCC)  Homelessness    History of Present illness: Nathan Norton is a 40 y.o. male with a past psychiatric history significant for schizoaffective disorder, who presents to [facility] unaccompanied as a voluntary walk-in.  When patient is asked why he came to [facility], patient states "I'm frustrated".  When patient is asked why he is frustrated, patient states "I need shoes, clothes, and food until I can get my EBT card back".  Patient also requesting Tylenol.  Per chart review, patient presented to [facility] on 07/25/2021 and left the facility without being seen by provider.  Prior to 07/25/2021, patient presented to [facility] on 07/19/2021 and was discharged with resources and recommendations for outpatient psychiatric follow-up (please see 07/19/2021 [facility] MSE note for further details regarding this assessment if necessary). Patient denies SI.  Patient denies any suicide attempts or self-injurious behaviors via intentionally cutting or burning himself since he was last at [facility] on 07/19/2021.  He denies HI.  Patient denies AVH on exam.  Patient endorses history of chronic auditory hallucinations and patient states that he ignores these auditory hallucinations.  Patient unable to provide details regarding when he last experienced auditory hallucinations or provide details regarding the quality of his auditory hallucinations.  Patient denies visual hallucinations.  On past evaluation on 07/19/2021, patient described his auditory hallucinations as "spiritual communications and godly things", but was not able to state when he last experienced auditory hallucinations at that time either.  Patient states that he is still homeless at this time.  Patient denies access to firearms or weapons.  Patient  denies any alcohol, tobacco, or illicit substance use (1 previous 07/19/2021 assessment, patient reported drinking alcohol on occasion but did not provide further details regarding his alcohol use patterns).  Patient states that he is on disability at this time.  Patient states that he has not taken any psychotropic medications since he was last evaluated at [facility] on 07/19/2021.  Patient denies any inpatient psychiatric hospitalizations since he was last at [facility] on 07/19/2021.  Patient states that he still does not have an outpatient psychiatrist or therapist at this time and has not been able to schedule any appointments with an outpatient psychiatrist or therapist at this time due to him not having a phone or car at this time.  On exam, patient is sitting in a chair, in no acute distress.  Patient's mood is euthymic with congruent affect.  Patient is alert and oriented x4, cooperative, and answers all questions appropriately.  Similar to previous assessment on 07/19/2021, patient provides tangential answers to questions intermittently throughout the assessment, but this appears to be patient's baseline.  Patient also appears to be slightly paranoid and delusional on exam, but this paranoia and delusion does not seem detrimental to the point that patient is experiencing a mental health crisis at this time.  Patient does not appear to be responding to external or internal stimuli on exam.  Patient's speech is clear and coherent with normal rate and normal volume.  Psychiatric Specialty Exam  Presentation  General Appearance:Appropriate for Environment; Fairly Groomed  Eye Contact:Fair; Contractor and Coherent; Normal Rate  Speech Volume:Normal  Handedness:Right   Mood and Affect  Mood:Euthymic  Affect:Congruent; Flat   Thought Process  Thought Processes:Coherent; Goal Directed  Descriptions of Associations:Intact  Orientation:Full (Time, Place and  Person)  Thought  Content:Paranoid Ideation; Delusions  Diagnosis of Schizophrenia or Schizoaffective disorder in past: Yes  Duration of Psychotic Symptoms: Greater than six months  Hallucinations:None  Ideas of Reference:Paranoia; Delusions  Suicidal Thoughts:No  Homicidal Thoughts:No   Sensorium  Memory:Immediate Fair; Recent Fair; Remote Fair  Judgment:Fair  Insight:Fair   Executive Functions  Concentration:Fair  Attention Span:Fair  Recall:Fair  Fund of Knowledge:Fair  Language:Fair   Psychomotor Activity  Psychomotor Activity:Normal   Assets  Assets:Communication Skills; Desire for Improvement; Financial Resources/Insurance; Leisure Time; Physical Health   Sleep  Sleep:Fair    Physical Exam: Physical Exam Vitals reviewed.  Constitutional:      General: He is not in acute distress.    Appearance: He is not ill-appearing, toxic-appearing or diaphoretic.  HENT:     Head: Normocephalic and atraumatic.     Right Ear: External ear normal.     Left Ear: External ear normal.     Nose: Nose normal.  Eyes:     General:        Right eye: No discharge.        Left eye: No discharge.     Conjunctiva/sclera: Conjunctivae normal.  Cardiovascular:     Rate and Rhythm: Normal rate.  Pulmonary:     Effort: Pulmonary effort is normal. No respiratory distress.  Musculoskeletal:        General: Normal range of motion.     Cervical back: Normal range of motion.  Neurological:     General: No focal deficit present.     Mental Status: He is alert and oriented to person, place, and time.     Comments: No tremor noted.   Psychiatric:        Attention and Perception: Attention and perception normal. He does not perceive visual hallucinations.        Mood and Affect: Mood and affect normal.        Speech: Speech normal.        Behavior: Behavior is not agitated, slowed, aggressive, withdrawn, hyperactive or combative. Behavior is cooperative.        Thought Content: Thought  content is paranoid and delusional. Thought content does not include homicidal or suicidal ideation.     Comments: Patient denies AVH on exam.  Patient endorses history of chronic auditory hallucinations and patient states that he ignores these auditory hallucinations.  Patient unable to provide details regarding when he last experienced auditory hallucinations or provide details regarding the quality of his auditory hallucinations.  Patient denies visual hallucinations.  On past evaluation on 07/19/2021, patient described his auditory hallucinations as "spiritual communications and godly things", but was not able to state when he last experienced auditory hallucinations at that time either.    Review of Systems  Constitutional:  Positive for malaise/fatigue and weight loss. Negative for chills, diaphoresis and fever.  HENT:  Negative for congestion.   Respiratory:  Negative for cough and shortness of breath.   Cardiovascular:  Negative for chest pain and palpitations.  Gastrointestinal:  Negative for abdominal pain, constipation, diarrhea, nausea and vomiting.  Musculoskeletal:  Negative for joint pain and myalgias.  Neurological:  Negative for dizziness, tremors, seizures and headaches.  Psychiatric/Behavioral:  Negative for depression, memory loss, substance abuse and suicidal ideas. The patient has insomnia. The patient is not nervous/anxious.        Patient denies AVH on exam.  Patient endorses history of chronic auditory hallucinations and patient states that he ignores these auditory hallucinations.  Patient unable  to provide details regarding when he last experienced auditory hallucinations or provide details regarding the quality of his auditory hallucinations.  Patient denies visual hallucinations.  On past evaluation on 07/19/2021, patient described his auditory hallucinations as "spiritual communications and godly things", but was not able to state when he last experienced auditory  hallucinations at that time either.   All other systems reviewed and are negative.  Vitals: Blood pressure 128/80, pulse 88, temperature 98.7 F (37.1 C), temperature source Oral, resp. rate 18, SpO2 99 %. There is no height or weight on file to calculate BMI.  Musculoskeletal: Strength & Muscle Tone: within normal limits Gait & Station: normal Patient leans: N/A   [facility] MSE Discharge Disposition for Follow up and Recommendations: Based on my evaluation the patient does not appear to have an emergency medical condition and can be discharged with resources and follow up care in outpatient services for Medication Management, Individual Therapy, and Group Therapy  Patient denies SI, HI, or AVH.  Patient does not appear to be psychotic on exam. Patient appears to be slightly paranoid and delusional on exam, but this paranoia and delusions do not seem detrimental to the point that patient is experiencing a mental health crisis at this time and it does not appear to be severe enough at this time to warrant inpatient psychiatric treatment or [facility] continuous assessment for the patient.  Patient is not a imminent risk/threat to himself or others.  Patient does not meet [facility] continuous assessment criteria for inpatient psychiatric treatment criteria at this time.  Similar to patient's previous 07/19/21 [facility] evaluation, continue to recommend that patient see an outpatient psychiatrist and/or therapist in order for patient to be restarted on a regular psychotropic medication regimen with an outpatient provider that can follow the patient closely and alter patient's psychotropic medication regimen as necessary.  Patient is a [facility] resident and has [employer].  He has recently been brought to my attention that patients with Medicare insurance can attend [facility] second-floor open access hours.  Recommend that patient attend [facility]  second-floor open access hours for psychiatry and/or therapy during walk-in hours.  Monday through Friday [facility] open access schedule provided to the patient.  Additional list of [facility] psychiatry and therapy outpatient facilities provided to the patient for patient to utilize in the case that he is unable to follow up with [facility] second-floor open access.  Homeless shelter resources provided to patient.  Safety planning done at length with the patient regarding appropriate actions to take/resources to utilize [facility], 911, nearest ED, suicide prevention Lifeline) if he becomes suicidal or homicidal, if his condition rapidly deteriorates/worsen/does not improve, or if he begins to experience a mental health crisis.  Patient verbalizes understanding and agreement of overall plan.  Patient verbally contracts for safety with this Clinical research associate.  All patient's questions answered and concerns addressed.  Patient discharged to his destination of choice.  Jaclyn Shaggy, PA-C 07/25/2021, 2:27 AM

## 2021-07-26 ENCOUNTER — Encounter (HOSPITAL_COMMUNITY): Payer: Self-pay | Admitting: Emergency Medicine

## 2021-07-26 ENCOUNTER — Emergency Department (HOSPITAL_COMMUNITY)
Admission: EM | Admit: 2021-07-26 | Discharge: 2021-07-26 | Disposition: A | Discharge status: Home / Self Care | Payer: Medicare (Managed Care) | Attending: Emergency Medicine | Admitting: Emergency Medicine

## 2021-07-26 DIAGNOSIS — R69 Illness, unspecified: Secondary | ICD-10-CM | POA: Diagnosis not present

## 2021-07-26 DIAGNOSIS — W010XXA Fall on same level from slipping, tripping and stumbling without subsequent striking against object, initial encounter: Secondary | ICD-10-CM | POA: Insufficient documentation

## 2021-07-26 DIAGNOSIS — Z5321 Procedure and treatment not carried out due to patient leaving prior to being seen by health care provider: Secondary | ICD-10-CM | POA: Diagnosis not present

## 2021-07-26 NOTE — ED Triage Notes (Signed)
Patient initially states his complaint is "the same old drip" and would not elaborate. After further requests for information patient states he tripped and fell last night and states "I think I might need a little bit of tylenol".

## 2021-07-26 NOTE — ED Notes (Signed)
Called pt 2x, no response. Registration stated pt left.

## 2021-07-27 ENCOUNTER — Emergency Department (HOSPITAL_COMMUNITY)
Admission: EM | Admit: 2021-07-27 | Discharge: 2021-07-29 | Disposition: A | Discharge status: Home / Self Care | Payer: Medicare (Managed Care) | Attending: Emergency Medicine | Admitting: Emergency Medicine

## 2021-07-27 ENCOUNTER — Encounter (HOSPITAL_COMMUNITY): Payer: Self-pay | Admitting: Emergency Medicine

## 2021-07-27 DIAGNOSIS — Z20822 Contact with and (suspected) exposure to covid-19: Secondary | ICD-10-CM | POA: Insufficient documentation

## 2021-07-27 DIAGNOSIS — F209 Schizophrenia, unspecified: Secondary | ICD-10-CM | POA: Diagnosis not present

## 2021-07-27 DIAGNOSIS — E119 Type 2 diabetes mellitus without complications: Secondary | ICD-10-CM | POA: Insufficient documentation

## 2021-07-27 DIAGNOSIS — I10 Essential (primary) hypertension: Secondary | ICD-10-CM | POA: Insufficient documentation

## 2021-07-27 DIAGNOSIS — R4689 Other symptoms and signs involving appearance and behavior: Secondary | ICD-10-CM | POA: Diagnosis not present

## 2021-07-27 DIAGNOSIS — R451 Restlessness and agitation: Secondary | ICD-10-CM | POA: Insufficient documentation

## 2021-07-27 DIAGNOSIS — Z046 Encounter for general psychiatric examination, requested by authority: Secondary | ICD-10-CM | POA: Diagnosis not present

## 2021-07-27 DIAGNOSIS — Z9119 Patient's noncompliance with other medical treatment and regimen: Secondary | ICD-10-CM | POA: Insufficient documentation

## 2021-07-27 DIAGNOSIS — R456 Violent behavior: Secondary | ICD-10-CM | POA: Diagnosis not present

## 2021-07-27 LAB — CBC WITH DIFFERENTIAL/PLATELET
Abs Immature Granulocytes: 0 10*3/uL (ref 0.00–0.07)
Basophils Absolute: 0.1 10*3/uL (ref 0.0–0.1)
Basophils Relative: 2 %
Eosinophils Absolute: 0.4 10*3/uL (ref 0.0–0.5)
Eosinophils Relative: 10 %
HCT: 39.8 % (ref 39.0–52.0)
Hemoglobin: 13.2 g/dL (ref 13.0–17.0)
Immature Granulocytes: 0 %
Lymphocytes Relative: 26 %
Lymphs Abs: 0.9 10*3/uL (ref 0.7–4.0)
MCH: 32.9 pg (ref 26.0–34.0)
MCHC: 33.2 g/dL (ref 30.0–36.0)
MCV: 99.3 fL (ref 80.0–100.0)
Monocytes Absolute: 0.3 10*3/uL (ref 0.1–1.0)
Monocytes Relative: 10 %
Neutro Abs: 1.9 10*3/uL (ref 1.7–7.7)
Neutrophils Relative %: 52 %
Platelets: 322 10*3/uL (ref 150–400)
RBC: 4.01 MIL/uL — ABNORMAL LOW (ref 4.22–5.81)
RDW: 15.6 % — ABNORMAL HIGH (ref 11.5–15.5)
WBC: 3.5 10*3/uL — ABNORMAL LOW (ref 4.0–10.5)
nRBC: 0 % (ref 0.0–0.2)

## 2021-07-27 LAB — COMPREHENSIVE METABOLIC PANEL
ALT: 24 U/L (ref 0–44)
AST: 26 U/L (ref 15–41)
Albumin: 4.7 g/dL (ref 3.5–5.0)
Alkaline Phosphatase: 69 U/L (ref 38–126)
Anion gap: 9 (ref 5–15)
BUN: 10 mg/dL (ref 6–20)
CO2: 24 mmol/L (ref 22–32)
Calcium: 9 mg/dL (ref 8.9–10.3)
Chloride: 106 mmol/L (ref 98–111)
Creatinine, Ser: 1 mg/dL (ref 0.61–1.24)
GFR, Estimated: >60 mL/min (ref >60)
Glucose, Bld: 75 mg/dL (ref 70–99)
Potassium: 3.8 mmol/L (ref 3.5–5.1)
Sodium: 139 mmol/L (ref 135–145)
Total Bilirubin: 1.1 mg/dL (ref 0.3–1.2)
Total Protein: 8.1 g/dL (ref 6.5–8.1)

## 2021-07-27 LAB — RESP PANEL BY RT-PCR (FLU A&B, COVID) ARPGX2
Influenza A by PCR: NEGATIVE
Influenza B by PCR: NEGATIVE
SARS Coronavirus 2 by RT PCR: NEGATIVE

## 2021-07-27 LAB — ETHANOL: Alcohol, Ethyl (B): 262 mg/dL — ABNORMAL HIGH (ref <10)

## 2021-07-27 MED ORDER — STERILE WATER FOR INJECTION IJ SOLN
INTRAMUSCULAR | Status: AC
  Filled 2021-07-27: qty 10

## 2021-07-27 MED ORDER — RISPERIDONE 1 MG PO TBDP
2.0000 mg | ORAL_TABLET | Freq: Three times a day (TID) | ORAL | Status: DC | PRN
Start: 2021-07-27 — End: 2021-07-29

## 2021-07-27 MED ORDER — LORAZEPAM 1 MG PO TABS: 1.0000 mg | ORAL_TABLET | ORAL | Status: DC | PRN

## 2021-07-27 MED ORDER — ZOLPIDEM TARTRATE 5 MG PO TABS: 5.0000 mg | ORAL_TABLET | Freq: Every evening | ORAL | Status: DC | PRN

## 2021-07-27 MED ORDER — ACETAMINOPHEN 325 MG PO TABS: 650.0000 mg | ORAL_TABLET | ORAL | Status: DC | PRN

## 2021-07-27 MED ORDER — NICOTINE 21 MG/24HR TD PT24
21.0000 mg | MEDICATED_PATCH | Freq: Every day | TRANSDERMAL | Status: DC
  Administered 2021-07-27: 21 mg via TRANSDERMAL
  Filled 2021-07-27: qty 1

## 2021-07-27 MED ORDER — ALUM & MAG HYDROXIDE-SIMETH 200-200-20 MG/5ML PO SUSP: 30.0000 mL | Freq: Four times a day (QID) | ORAL | Status: DC | PRN

## 2021-07-27 MED ORDER — ONDANSETRON HCL 4 MG PO TABS: 4.0000 mg | ORAL_TABLET | Freq: Three times a day (TID) | ORAL | Status: DC | PRN

## 2021-07-27 MED ORDER — ZIPRASIDONE MESYLATE 20 MG IM SOLR
20.0000 mg | INTRAMUSCULAR | Status: AC | PRN
  Administered 2021-07-27: 20 mg via INTRAMUSCULAR
  Filled 2021-07-27: qty 20
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
## 2021-07-27 NOTE — BH Assessment (Signed)
Clinician attempted to make contact with members of pt's team in an effort to complete pt's MH Assessment, though no team members have been assigned to pt's chart. Clinician then attempted to contact [facility] Triage and called at [phone] and [phone] but there was no answer. TTS to attempt assessment at a later time.

## 2021-07-27 NOTE — ED Notes (Signed)
Patient refused vitals at this time.

## 2021-07-27 NOTE — ED Notes (Signed)
Pt is yelling, cursing, and smacking the bedside table.
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
## 2021-07-28 DIAGNOSIS — Z046 Encounter for general psychiatric examination, requested by authority: Secondary | ICD-10-CM | POA: Diagnosis not present

## 2021-07-28 LAB — RAPID URINE DRUG SCREEN, HOSP PERFORMED
Amphetamines: NOT DETECTED
Barbiturates: NOT DETECTED
Benzodiazepines: NOT DETECTED
Cocaine: NOT DETECTED
Opiates: NOT DETECTED
Tetrahydrocannabinol: NOT DETECTED

## 2021-07-28 MED ORDER — OLANZAPINE 10 MG PO TBDP: 10.0000 mg | ORAL_TABLET | Freq: Every day | ORAL | Status: DC

## 2021-07-28 NOTE — ED Notes (Signed)
Patient not visualized in room, registration visualized patient leaving through ED lobby by registation. Security notified.

## 2021-07-28 NOTE — BH Assessment (Signed)
Patient per triage nurse reported he eloped from ED at 0930 hours. [employer] and security was notified. Patient has not been located as of this note. Status pending.

## 2021-07-31 DIAGNOSIS — F10929 Alcohol use, unspecified with intoxication, unspecified: Secondary | ICD-10-CM | POA: Insufficient documentation

## 2021-08-09 ENCOUNTER — Other Ambulatory Visit: Payer: Self-pay

## 2021-08-09 ENCOUNTER — Ambulatory Visit: Payer: Medicare (Managed Care) | Attending: Physician Assistant | Admitting: Physician Assistant

## 2021-08-28 DIAGNOSIS — Z765 Malingerer [conscious simulation]: Secondary | ICD-10-CM | POA: Insufficient documentation

## 2021-08-30 ENCOUNTER — Encounter: Payer: Self-pay | Admitting: Emergency Medicine

## 2021-08-30 ENCOUNTER — Emergency Department (HOSPITAL_BASED_OUTPATIENT_CLINIC_OR_DEPARTMENT_OTHER)
Admission: EM | Admit: 2021-08-30 | Discharge: 2021-08-31 | Disposition: A | Discharge status: Home / Self Care | Payer: Medicare (Managed Care) | Attending: Emergency Medicine | Admitting: Emergency Medicine

## 2021-08-30 ENCOUNTER — Encounter (HOSPITAL_BASED_OUTPATIENT_CLINIC_OR_DEPARTMENT_OTHER): Payer: Self-pay | Admitting: Emergency Medicine

## 2021-08-30 DIAGNOSIS — Z5901 Sheltered homelessness: Secondary | ICD-10-CM | POA: Insufficient documentation

## 2021-08-30 DIAGNOSIS — F17210 Nicotine dependence, cigarettes, uncomplicated: Secondary | ICD-10-CM | POA: Insufficient documentation

## 2021-08-30 DIAGNOSIS — Z5900 Homelessness unspecified: Secondary | ICD-10-CM | POA: Insufficient documentation

## 2021-08-30 DIAGNOSIS — F10220 Alcohol dependence with intoxication, uncomplicated: Secondary | ICD-10-CM | POA: Insufficient documentation

## 2021-08-30 DIAGNOSIS — Z79899 Other long term (current) drug therapy: Secondary | ICD-10-CM | POA: Insufficient documentation

## 2021-08-30 DIAGNOSIS — S069X9A Unspecified intracranial injury with loss of consciousness of unspecified duration, initial encounter: Secondary | ICD-10-CM | POA: Insufficient documentation

## 2021-08-30 DIAGNOSIS — E119 Type 2 diabetes mellitus without complications: Secondary | ICD-10-CM | POA: Insufficient documentation

## 2021-08-30 DIAGNOSIS — F1010 Alcohol abuse, uncomplicated: Secondary | ICD-10-CM | POA: Insufficient documentation

## 2021-08-30 DIAGNOSIS — I10 Essential (primary) hypertension: Secondary | ICD-10-CM | POA: Insufficient documentation

## 2021-08-30 DIAGNOSIS — F1210 Cannabis abuse, uncomplicated: Secondary | ICD-10-CM | POA: Insufficient documentation

## 2021-08-30 DIAGNOSIS — F10920 Alcohol use, unspecified with intoxication, uncomplicated: Secondary | ICD-10-CM

## 2021-08-30 DIAGNOSIS — S069XAA Unspecified intracranial injury with loss of consciousness status unknown, initial encounter: Secondary | ICD-10-CM | POA: Insufficient documentation

## 2021-08-30 HISTORY — DX: Alcohol abuse, uncomplicated: F10.10

## 2021-08-30 HISTORY — DX: Homelessness unspecified: Z59.00

## 2021-08-30 LAB — CBC WITH DIFFERENTIAL/PLATELET
Abs Immature Granulocytes: 0.02 10*3/uL (ref 0.00–0.07)
Basophils Absolute: 0.1 10*3/uL (ref 0.0–0.1)
Basophils Relative: 1 %
Eosinophils Absolute: 0.3 10*3/uL (ref 0.0–0.5)
Eosinophils Relative: 5 %
HCT: 36 % — ABNORMAL LOW (ref 39.0–52.0)
Hemoglobin: 12.3 g/dL — ABNORMAL LOW (ref 13.0–17.0)
Immature Granulocytes: 0 %
Lymphocytes Relative: 17 %
Lymphs Abs: 0.9 10*3/uL (ref 0.7–4.0)
MCH: 33.5 pg (ref 26.0–34.0)
MCHC: 34.2 g/dL (ref 30.0–36.0)
MCV: 98.1 fL (ref 80.0–100.0)
Monocytes Absolute: 0.4 10*3/uL (ref 0.1–1.0)
Monocytes Relative: 7 %
Neutro Abs: 3.7 10*3/uL (ref 1.7–7.7)
Neutrophils Relative %: 70 %
Platelets: 282 10*3/uL (ref 150–400)
RBC: 3.67 MIL/uL — ABNORMAL LOW (ref 4.22–5.81)
RDW: 15.2 % (ref 11.5–15.5)
WBC: 5.4 10*3/uL (ref 4.0–10.5)
nRBC: 0 % (ref 0.0–0.2)

## 2021-08-30 LAB — RAPID URINE DRUG SCREEN, HOSP PERFORMED
Amphetamines: NOT DETECTED
Barbiturates: NOT DETECTED
Benzodiazepines: NOT DETECTED
Cocaine: NOT DETECTED
Opiates: NOT DETECTED
Tetrahydrocannabinol: POSITIVE — AB

## 2021-08-30 LAB — BASIC METABOLIC PANEL
Anion gap: 11 (ref 5–15)
BUN: 11 mg/dL (ref 6–20)
CO2: 21 mmol/L — ABNORMAL LOW (ref 22–32)
Calcium: 8.6 mg/dL — ABNORMAL LOW (ref 8.9–10.3)
Chloride: 100 mmol/L (ref 98–111)
Creatinine, Ser: 1.04 mg/dL (ref 0.61–1.24)
GFR, Estimated: >60 mL/min (ref >60)
Glucose, Bld: 137 mg/dL — ABNORMAL HIGH (ref 70–99)
Potassium: 3.4 mmol/L — ABNORMAL LOW (ref 3.5–5.1)
Sodium: 132 mmol/L — ABNORMAL LOW (ref 135–145)

## 2021-08-30 LAB — CBG MONITORING, ED: Glucose-Capillary: 121 mg/dL — ABNORMAL HIGH (ref 70–99)

## 2021-08-30 LAB — ETHANOL: Alcohol, Ethyl (B): 228 mg/dL — ABNORMAL HIGH (ref <10)

## 2021-08-30 MED ORDER — ZIPRASIDONE MESYLATE 20 MG IM SOLR: 20.0000 mg | Freq: Once | INTRAMUSCULAR | Status: DC | PRN

## 2021-08-30 MED ORDER — THIAMINE HCL 100 MG/ML IJ SOLN
100.0000 mg | Freq: Once | INTRAMUSCULAR | Status: AC
  Administered 2021-08-30: 100 mg via INTRAVENOUS
  Filled 2021-08-30: qty 2

## 2021-08-30 MED ORDER — ONDANSETRON HCL 4 MG/2ML IJ SOLN
4.0000 mg | Freq: Once | INTRAMUSCULAR | Status: AC
  Administered 2021-08-30: 4 mg via INTRAVENOUS
  Filled 2021-08-30: qty 2

## 2021-08-30 MED ORDER — SODIUM CHLORIDE 0.9 % IV BOLUS
1000.0000 mL | Freq: Once | INTRAVENOUS | Status: AC
  Administered 2021-08-30: 1000 mL via INTRAVENOUS

## 2021-08-30 MED ORDER — PANTOPRAZOLE SODIUM 40 MG IV SOLR
40.0000 mg | Freq: Once | INTRAVENOUS | Status: AC
  Administered 2021-08-30: 40 mg via INTRAVENOUS
  Filled 2021-08-30: qty 40
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
## 2021-08-30 NOTE — ED Notes (Signed)
Pt. Is resting with eyes closed and not talking at present time as he was before.  Pt. Was singing earlier and using dirty language with the nurses.

## 2021-08-31 NOTE — ED Notes (Signed)
Pt. Brought in by EMS earlier.  Charge RN Timmie Foerster given report on the Pt.   Pt. Is in no distress and is intoxicated with etoh.  Pt. Has been sleeping for sometime since he was told not to touch the RNs  And to keep the dirty talk to a minimum.

## 2021-12-02 ENCOUNTER — Emergency Department (HOSPITAL_COMMUNITY): Payer: Medicare (Managed Care)

## 2021-12-02 ENCOUNTER — Other Ambulatory Visit: Payer: Self-pay

## 2021-12-02 ENCOUNTER — Encounter (HOSPITAL_COMMUNITY): Payer: Self-pay | Admitting: *Deleted

## 2021-12-02 ENCOUNTER — Emergency Department (HOSPITAL_COMMUNITY)
Admission: EM | Admit: 2021-12-02 | Discharge: 2021-12-02 | Disposition: A | Discharge status: Home / Self Care | Payer: Medicare (Managed Care) | Attending: Emergency Medicine | Admitting: Emergency Medicine

## 2021-12-02 DIAGNOSIS — F17210 Nicotine dependence, cigarettes, uncomplicated: Secondary | ICD-10-CM | POA: Insufficient documentation

## 2021-12-02 DIAGNOSIS — I10 Essential (primary) hypertension: Secondary | ICD-10-CM | POA: Insufficient documentation

## 2021-12-02 DIAGNOSIS — E119 Type 2 diabetes mellitus without complications: Secondary | ICD-10-CM | POA: Diagnosis not present

## 2021-12-02 DIAGNOSIS — M25511 Pain in right shoulder: Secondary | ICD-10-CM | POA: Insufficient documentation

## 2021-12-02 NOTE — Discharge Instructions (Signed)
Your x-ray was without fracture.  Continue taking Tylenol or ibuprofen for pain.

## 2021-12-02 NOTE — ED Provider Notes (Signed)
Emergency Medicine Provider Triage Evaluation Note  Nathan Norton , a 40 y.o. male  was evaluated in triage.  Unclear reasoning for ED visit.  When asked what brought him in he replies "not much".  Unsure of intoxication.  Frequent ED visits with similar presentation-- 33 in the last 6 months.  Review of Systems  Positive: N/a Negative: N/a  Physical Exam  BP 110/62 (BP Location: Right Arm)   Pulse 72   Temp 97.9 F (36.6 C) (Oral)   Resp 15   SpO2 98%   Gen:   Awake, no distress   Resp:  Normal effort  MSK:   Moves extremities without difficulty  Other:  Slumped over in chair, mumbling, some type of liquid dripping out of pants  Medical Decision Making  Medically screening exam initiated at 2:18 AM.  Appropriate orders placed.  Nathan Norton was informed that the remainder of the evaluation will be completed by another provider, this initial triage assessment does not replace that evaluation, and the importance of remaining in the ED until their evaluation is complete.     [name], PA-C 12/02/21 0222    Nira Conn, MD 12/02/21 [phone]

## 2021-12-02 NOTE — ED Notes (Signed)
Patient transported to X-ray 

## 2021-12-02 NOTE — ED Triage Notes (Signed)
The pt frequents the ed  he is intoxicated and he wants to sleep in the waiting room
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
## 2021-12-02 NOTE — ED Notes (Signed)
Pt understands discharge paperwork and denies questions. Pt given bus pass and [place] sandwich to go

## 2021-12-02 NOTE — ED Notes (Addendum)
Pt brought back to room with complaints of left shoulder pain and left foot pain. Pt is also requesting a bus pass. Placed pt on monitor and given warm blanket.

## 2021-12-03 ENCOUNTER — Emergency Department (HOSPITAL_COMMUNITY)
Admission: EM | Admit: 2021-12-03 | Discharge: 2021-12-03 | Disposition: A | Discharge status: Home / Self Care | Payer: Medicare (Managed Care) | Attending: Emergency Medicine | Admitting: Emergency Medicine

## 2021-12-03 DIAGNOSIS — J00 Acute nasopharyngitis [common cold]: Secondary | ICD-10-CM | POA: Insufficient documentation

## 2021-12-03 DIAGNOSIS — E119 Type 2 diabetes mellitus without complications: Secondary | ICD-10-CM | POA: Insufficient documentation

## 2021-12-03 DIAGNOSIS — F17210 Nicotine dependence, cigarettes, uncomplicated: Secondary | ICD-10-CM | POA: Insufficient documentation

## 2021-12-03 DIAGNOSIS — I10 Essential (primary) hypertension: Secondary | ICD-10-CM | POA: Insufficient documentation

## 2021-12-03 DIAGNOSIS — R051 Acute cough: Secondary | ICD-10-CM | POA: Diagnosis present

## 2021-12-03 NOTE — ED Triage Notes (Addendum)
Pt presents to ED BIB [facility]. Pt c/o generalized bodyaches.

## 2021-12-03 NOTE — ED Triage Notes (Signed)
Pt is in ED frequent and checked in says he is sick.

## 2021-12-03 NOTE — ED Notes (Signed)
Pt provided discharge instructions and prescription information. Pt was given the opportunity to ask questions and questions were answered. Discharge signature not obtained in the setting of the COVID-19 pandemic in order to reduce high touch surfaces.  ° °
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
## 2021-12-04 ENCOUNTER — Encounter (HOSPITAL_COMMUNITY): Payer: Self-pay | Admitting: Emergency Medicine

## 2021-12-04 ENCOUNTER — Emergency Department (HOSPITAL_COMMUNITY)
Admission: EM | Admit: 2021-12-04 | Discharge: 2021-12-04 | Disposition: A | Discharge status: Home / Self Care | Payer: Medicare (Managed Care) | Source: Home / Self Care

## 2021-12-04 ENCOUNTER — Other Ambulatory Visit: Payer: Self-pay

## 2021-12-04 DIAGNOSIS — R6889 Other general symptoms and signs: Secondary | ICD-10-CM

## 2021-12-04 NOTE — ED Notes (Signed)
Discharge instructions discussed with pt. Pt verbalized understanding. Pt stable and ambulatory. Pt being escorted out by security

## 2021-12-04 NOTE — Discharge Instructions (Addendum)
Follow up with primary care.  Return to the ER for new or worsening symptoms or any other concerns.
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
## 2021-12-05 ENCOUNTER — Encounter (HOSPITAL_COMMUNITY): Payer: Self-pay

## 2021-12-05 ENCOUNTER — Emergency Department (HOSPITAL_COMMUNITY)
Admission: EM | Admit: 2021-12-05 | Discharge: 2021-12-05 | Disposition: A | Discharge status: Home / Self Care | Payer: Medicare (Managed Care) | Attending: Emergency Medicine | Admitting: Emergency Medicine

## 2021-12-05 DIAGNOSIS — X31XXXA Exposure to excessive natural cold, initial encounter: Secondary | ICD-10-CM | POA: Diagnosis not present

## 2021-12-05 DIAGNOSIS — F17210 Nicotine dependence, cigarettes, uncomplicated: Secondary | ICD-10-CM | POA: Diagnosis not present

## 2021-12-05 DIAGNOSIS — E119 Type 2 diabetes mellitus without complications: Secondary | ICD-10-CM | POA: Insufficient documentation

## 2021-12-05 DIAGNOSIS — J00 Acute nasopharyngitis [common cold]: Secondary | ICD-10-CM | POA: Insufficient documentation

## 2021-12-05 DIAGNOSIS — T699XXA Effect of reduced temperature, unspecified, initial encounter: Secondary | ICD-10-CM

## 2021-12-05 DIAGNOSIS — I10 Essential (primary) hypertension: Secondary | ICD-10-CM | POA: Diagnosis not present

## 2021-12-05 NOTE — Discharge Instructions (Signed)
Can go to one of the local shelters.
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
## 2021-12-05 NOTE — ED Triage Notes (Signed)
Pt BIB [facility] for complaints of "being cold". Seen thrice yesterday for similar complaints.

## 2021-12-07 ENCOUNTER — Ambulatory Visit (HOSPITAL_COMMUNITY)
Admission: EM | Admit: 2021-12-07 | Discharge: 2021-12-07 | Discharge status: Against Medical Advice | Payer: Medicare (Managed Care)

## 2021-12-07 NOTE — Progress Notes (Signed)
Pt came to [facility].  He had a blood pressure cuff on his arm.  Pt said "it was when they took my blood pressure at the hospital."  The cuff was very worn looking.  Clinician asked if he had come from [facility] and he said yes.  Pt did sign the MSE.  He gave no indication of being suicidal.  He did not want to be wanded prior to being assessed.  He got up and said he would go.  Clinician asked if he wanted assessment and patient said "no."  By that time PA Melbourne Abts came out and asked patient if he wanted to be seen and he said "no" and walked out the front door.

## 2021-12-08 ENCOUNTER — Emergency Department (HOSPITAL_COMMUNITY)
Admission: EM | Admit: 2021-12-08 | Discharge: 2021-12-09 | Disposition: A | Discharge status: Home / Self Care | Payer: Medicare (Managed Care) | Source: Home / Self Care

## 2021-12-08 ENCOUNTER — Emergency Department (HOSPITAL_COMMUNITY): Payer: Medicare (Managed Care)

## 2021-12-08 ENCOUNTER — Other Ambulatory Visit: Payer: Self-pay

## 2021-12-08 ENCOUNTER — Emergency Department (HOSPITAL_COMMUNITY)
Admission: EM | Admit: 2021-12-08 | Discharge: 2021-12-08 | Disposition: A | Discharge status: Home / Self Care | Payer: Medicare (Managed Care) | Attending: Emergency Medicine | Admitting: Emergency Medicine

## 2021-12-08 ENCOUNTER — Encounter (HOSPITAL_COMMUNITY): Payer: Self-pay | Admitting: Emergency Medicine

## 2021-12-08 DIAGNOSIS — M25512 Pain in left shoulder: Secondary | ICD-10-CM | POA: Insufficient documentation

## 2021-12-08 DIAGNOSIS — M79674 Pain in right toe(s): Secondary | ICD-10-CM | POA: Insufficient documentation

## 2021-12-08 DIAGNOSIS — M25511 Pain in right shoulder: Secondary | ICD-10-CM | POA: Insufficient documentation

## 2021-12-08 DIAGNOSIS — F10120 Alcohol abuse with intoxication, uncomplicated: Secondary | ICD-10-CM | POA: Insufficient documentation

## 2021-12-08 DIAGNOSIS — Z5321 Procedure and treatment not carried out due to patient leaving prior to being seen by health care provider: Secondary | ICD-10-CM | POA: Insufficient documentation

## 2021-12-08 DIAGNOSIS — R000 Tachycardia, unspecified: Secondary | ICD-10-CM | POA: Insufficient documentation

## 2021-12-08 NOTE — ED Provider Notes (Signed)
Emergency Medicine Provider Triage Evaluation Note  Nathan Norton , a 40 y.o. male  was evaluated in triage.  Pt complains of pain in his shoulders and toes.  He denies any fevers.  He states his shoulders have been hurting him on and off for a few days.  When I asked him if there was any instigating factors he states that he may have done sports that might be making his shoulders hurt however he does not know.  He denies any trauma. He also complains of pain in his right great toe.  He denies any trauma.  States that that is tendinitis and gets better and worse.  No fevers reported.  Review of Systems  Positive: Bilateral shoulder pain, toe pain Negative: Fevers, chest pain  Physical Exam  BP (!) 146/91   Pulse (!) 128   Temp 98.6 F (37 C) (Oral)   Resp 18   SpO2 99%  Gen:   Awake, no distress   Resp:  Normal effort  MSK:   Moves extremities without difficulty  Other:  Right great toe has pallor only plantar surface without obvious wounds. Able to move both arms without difficulty.    Medical Decision Making  Medically screening exam initiated at 10:47 PM.  Appropriate orders placed.  Kardell Virgil was informed that the remainder of the evaluation will be completed by another provider, this initial triage assessment does not replace that evaluation, and the importance of remaining in the ED until their evaluation is complete.  Patient is a-year-old man who presents today for evaluation of intermittent pain in both of his shoulders and pain in his right great toe.  No fevers reported. Is slightly tachycardic on recorded triage vitals, when I checked it it is improved to 114.  He does not clearly have any evidence of bacterial infection at this time.  He is afebrile.  Per his request will obtain shoulder x-rays.    Cristina Gong, PA-C 12/08/21 2251    Sloan Leiter, DO 12/11/21 [phone]

## 2021-12-08 NOTE — ED Notes (Signed)
Pt stated to registration that pt was leaving and pt "will see her later", pt seen outside by this NT with Polly(EMT) pt stated that pt was leaving. Pt not seen in lobby.
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
## 2021-12-08 NOTE — ED Triage Notes (Signed)
Pt reports right toe pain and is requesting "shots" like I gave him last time.

## 2021-12-09 NOTE — ED Notes (Signed)
Attempted to bring pt to triage room from lobby to insert PIV and obtain blood work. Pt became agitated with RN. Explained to pt need for blood work and IV. Pt stated intent to leave AMA, refusing to sign AMA form. Pt ambulated out of waiting area, NAD noted. Cyndia Skeeters. PA notified of pt disposition.

## 2021-12-09 NOTE — ED Provider Notes (Signed)
Patient continues to complain of bilateral, nontraumatic shoulder pain.  Heart rate 128.  Concern for possible pulmonary embolism.  Work-up pending.  Face to face Exam:   General: Awake  HEENT: Atraumatic  Resp: Normal effort  Abd: Nondistended      Milta Deiters 12/09/21 0555    Nira Conn, MD 12/09/21 [phone]

## 2021-12-11 ENCOUNTER — Emergency Department (HOSPITAL_COMMUNITY)
Admission: EM | Admit: 2021-12-11 | Discharge: 2021-12-11 | Attending: Emergency Medicine | Admitting: Emergency Medicine

## 2021-12-11 ENCOUNTER — Emergency Department (HOSPITAL_COMMUNITY)

## 2021-12-11 ENCOUNTER — Emergency Department (HOSPITAL_COMMUNITY)
Admission: EM | Admit: 2021-12-11 | Discharge: 2021-12-11 | Disposition: A | Discharge status: Home / Self Care | Payer: Medicare (Managed Care) | Attending: Emergency Medicine | Admitting: Emergency Medicine

## 2021-12-11 ENCOUNTER — Encounter (HOSPITAL_COMMUNITY): Payer: Self-pay

## 2021-12-11 ENCOUNTER — Other Ambulatory Visit: Payer: Self-pay

## 2021-12-11 DIAGNOSIS — E119 Type 2 diabetes mellitus without complications: Secondary | ICD-10-CM | POA: Insufficient documentation

## 2021-12-11 DIAGNOSIS — F10120 Alcohol abuse with intoxication, uncomplicated: Secondary | ICD-10-CM | POA: Insufficient documentation

## 2021-12-11 DIAGNOSIS — Z0279 Encounter for issue of other medical certificate: Secondary | ICD-10-CM | POA: Diagnosis not present

## 2021-12-11 DIAGNOSIS — W19XXXA Unspecified fall, initial encounter: Secondary | ICD-10-CM | POA: Insufficient documentation

## 2021-12-11 DIAGNOSIS — F17210 Nicotine dependence, cigarettes, uncomplicated: Secondary | ICD-10-CM | POA: Insufficient documentation

## 2021-12-11 DIAGNOSIS — I10 Essential (primary) hypertension: Secondary | ICD-10-CM | POA: Diagnosis not present

## 2021-12-11 DIAGNOSIS — Z789 Other specified health status: Secondary | ICD-10-CM

## 2021-12-11 DIAGNOSIS — S0990XA Unspecified injury of head, initial encounter: Secondary | ICD-10-CM | POA: Diagnosis present

## 2021-12-11 DIAGNOSIS — S0101XA Laceration without foreign body of scalp, initial encounter: Secondary | ICD-10-CM

## 2021-12-11 DIAGNOSIS — F1090 Alcohol use, unspecified, uncomplicated: Secondary | ICD-10-CM

## 2021-12-11 LAB — CBG MONITORING, ED: Glucose-Capillary: 101 mg/dL — ABNORMAL HIGH (ref 70–99)
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
## 2021-12-11 NOTE — Discharge Instructions (Signed)
Clean laceration twice a day with soap and water.  You should follow-up with your family doctor or an urgent care to get the staples out in a week.  You have been referred to a neurosurgeon Dr. Conchita Paris and you should follow-up with him in the next month or 2 to discuss this findings on your head CT.  It is probably chronic and they will do nothing about it
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
## 2021-12-11 NOTE — ED Notes (Signed)
Pt is ambulatory.  

## 2021-12-11 NOTE — ED Triage Notes (Signed)
Pt was intoxicated and being belligerent in a store, which caused his to get arrested, [employer] wanted the jail to hold him for awhile before he went in front of the magistrate and they wouldn't and made them come here Pt is still in custody

## 2021-12-11 NOTE — ED Triage Notes (Signed)
Pt BIB police and EMS, pt reports with ETOH. Pt needs medical clearance for jail.

## 2021-12-11 NOTE — ED Triage Notes (Signed)
Pt arrived via police, in custody, laceration to top of head. Fell. Bleeding controlled. Intoxicated. Last tetanus unknown.

## 2021-12-15 ENCOUNTER — Emergency Department (HOSPITAL_COMMUNITY)
Admission: EM | Admit: 2021-12-15 | Discharge: 2021-12-15 | Disposition: A | Discharge status: Home / Self Care | Payer: Medicare (Managed Care) | Attending: Emergency Medicine | Admitting: Emergency Medicine

## 2021-12-15 ENCOUNTER — Telehealth (HOSPITAL_COMMUNITY): Payer: Self-pay

## 2021-12-15 ENCOUNTER — Encounter (HOSPITAL_COMMUNITY): Payer: Self-pay

## 2021-12-15 DIAGNOSIS — I10 Essential (primary) hypertension: Secondary | ICD-10-CM | POA: Insufficient documentation

## 2021-12-15 DIAGNOSIS — E119 Type 2 diabetes mellitus without complications: Secondary | ICD-10-CM | POA: Diagnosis not present

## 2021-12-15 DIAGNOSIS — M79672 Pain in left foot: Secondary | ICD-10-CM | POA: Insufficient documentation

## 2021-12-15 DIAGNOSIS — F17210 Nicotine dependence, cigarettes, uncomplicated: Secondary | ICD-10-CM | POA: Insufficient documentation

## 2021-12-15 DIAGNOSIS — M25512 Pain in left shoulder: Secondary | ICD-10-CM | POA: Insufficient documentation

## 2021-12-15 MED ORDER — ACETAMINOPHEN 325 MG PO TABS
650.0000 mg | ORAL_TABLET | Freq: Four times a day (QID) | ORAL | Status: DC | PRN
  Administered 2021-12-15: 650 mg via ORAL

## 2021-12-15 NOTE — Discharge Instructions (Signed)
Try to keep your feet and shoes dry, this will prevent skin breakdown.

## 2021-12-15 NOTE — ED Triage Notes (Signed)
Pt states that his L shoulder has been hurting for 2 weeks and his feet hurt
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
## 2021-12-15 NOTE — BH Assessment (Signed)
Care Management - [facility] Follow Up Discharge  Writer attempted to make contact with patient today and was unsuccessful.  Phone just rang and unable to leave a message.    Per chart review, patient signed the MSE form.   Patient did not want to be assessed by TTS or the NP.   Patient left without being assessed.

## 2021-12-16 ENCOUNTER — Other Ambulatory Visit: Payer: Self-pay

## 2021-12-16 ENCOUNTER — Emergency Department (HOSPITAL_COMMUNITY)
Admission: EM | Admit: 2021-12-16 | Discharge: 2021-12-16 | Disposition: A | Discharge status: Home / Self Care | Payer: Medicare (Managed Care) | Attending: Emergency Medicine | Admitting: Emergency Medicine

## 2021-12-16 DIAGNOSIS — E119 Type 2 diabetes mellitus without complications: Secondary | ICD-10-CM | POA: Diagnosis not present

## 2021-12-16 DIAGNOSIS — M79672 Pain in left foot: Secondary | ICD-10-CM | POA: Insufficient documentation

## 2021-12-16 DIAGNOSIS — I10 Essential (primary) hypertension: Secondary | ICD-10-CM | POA: Insufficient documentation

## 2021-12-16 DIAGNOSIS — G8929 Other chronic pain: Secondary | ICD-10-CM | POA: Insufficient documentation

## 2021-12-16 DIAGNOSIS — F17210 Nicotine dependence, cigarettes, uncomplicated: Secondary | ICD-10-CM | POA: Diagnosis not present

## 2021-12-16 DIAGNOSIS — M25512 Pain in left shoulder: Secondary | ICD-10-CM | POA: Diagnosis not present

## 2021-12-16 MED ORDER — ACETAMINOPHEN 325 MG PO TABS
650.0000 mg | ORAL_TABLET | Freq: Four times a day (QID) | ORAL | Status: DC | PRN
  Administered 2021-12-16: 23:00:00 650 mg via ORAL
  Filled 2021-12-16: qty 2
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
## 2021-12-18 ENCOUNTER — Emergency Department (HOSPITAL_COMMUNITY)
Admission: EM | Admit: 2021-12-18 | Discharge: 2021-12-18 | Disposition: A | Discharge status: Home / Self Care | Payer: Medicare (Managed Care) | Source: Home / Self Care | Attending: Emergency Medicine | Admitting: Emergency Medicine

## 2021-12-18 ENCOUNTER — Encounter (HOSPITAL_COMMUNITY): Payer: Self-pay | Admitting: Emergency Medicine

## 2021-12-18 ENCOUNTER — Emergency Department (HOSPITAL_COMMUNITY)
Admission: EM | Admit: 2021-12-18 | Discharge: 2021-12-18 | Disposition: A | Discharge status: Home / Self Care | Payer: Medicare (Managed Care) | Attending: Emergency Medicine | Admitting: Emergency Medicine

## 2021-12-18 ENCOUNTER — Other Ambulatory Visit: Payer: Self-pay

## 2021-12-18 DIAGNOSIS — E119 Type 2 diabetes mellitus without complications: Secondary | ICD-10-CM | POA: Insufficient documentation

## 2021-12-18 DIAGNOSIS — M791 Myalgia, unspecified site: Secondary | ICD-10-CM | POA: Diagnosis not present

## 2021-12-18 DIAGNOSIS — F17210 Nicotine dependence, cigarettes, uncomplicated: Secondary | ICD-10-CM | POA: Insufficient documentation

## 2021-12-18 DIAGNOSIS — I10 Essential (primary) hypertension: Secondary | ICD-10-CM | POA: Insufficient documentation

## 2021-12-18 DIAGNOSIS — M2550 Pain in unspecified joint: Secondary | ICD-10-CM

## 2021-12-18 DIAGNOSIS — M25549 Pain in joints of unspecified hand: Secondary | ICD-10-CM | POA: Insufficient documentation

## 2021-12-18 MED ORDER — CLOTRIMAZOLE 1 % EX CREA
TOPICAL_CREAM | Freq: Two times a day (BID) | CUTANEOUS | Status: DC
  Filled 2021-12-18: qty 15

## 2021-12-18 MED ORDER — ACETAMINOPHEN 325 MG PO TABS
650.0000 mg | ORAL_TABLET | Freq: Four times a day (QID) | ORAL | Status: DC | PRN
  Administered 2021-12-18: 650 mg via ORAL
  Filled 2021-12-18: qty 2
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
## 2021-12-18 NOTE — ED Triage Notes (Signed)
Pt c/o hand pain, also endorses foot pain. Here this AM, LWBS

## 2021-12-18 NOTE — ED Triage Notes (Signed)
Pt here for body aches, states ongoing issue but worse today.

## 2021-12-21 ENCOUNTER — Emergency Department (HOSPITAL_COMMUNITY)
Admission: EM | Admit: 2021-12-21 | Discharge: 2021-12-21 | Disposition: A | Discharge status: Home / Self Care | Payer: Medicare (Managed Care) | Attending: Emergency Medicine | Admitting: Emergency Medicine

## 2021-12-21 ENCOUNTER — Other Ambulatory Visit: Payer: Self-pay

## 2021-12-21 ENCOUNTER — Emergency Department (HOSPITAL_COMMUNITY)
Admission: EM | Admit: 2021-12-21 | Discharge: 2021-12-22 | Disposition: A | Discharge status: Home / Self Care | Payer: Medicare (Managed Care) | Source: Home / Self Care | Attending: Emergency Medicine | Admitting: Emergency Medicine

## 2021-12-21 DIAGNOSIS — F17210 Nicotine dependence, cigarettes, uncomplicated: Secondary | ICD-10-CM | POA: Insufficient documentation

## 2021-12-21 DIAGNOSIS — Z5900 Homelessness unspecified: Secondary | ICD-10-CM | POA: Diagnosis not present

## 2021-12-21 DIAGNOSIS — I10 Essential (primary) hypertension: Secondary | ICD-10-CM | POA: Insufficient documentation

## 2021-12-21 DIAGNOSIS — T699XXA Effect of reduced temperature, unspecified, initial encounter: Secondary | ICD-10-CM

## 2021-12-21 DIAGNOSIS — X31XXXA Exposure to excessive natural cold, initial encounter: Secondary | ICD-10-CM | POA: Insufficient documentation

## 2021-12-21 DIAGNOSIS — E119 Type 2 diabetes mellitus without complications: Secondary | ICD-10-CM | POA: Insufficient documentation

## 2021-12-21 DIAGNOSIS — M79675 Pain in left toe(s): Secondary | ICD-10-CM

## 2021-12-21 MED ORDER — ACETAMINOPHEN 500 MG PO TABS
1000.0000 mg | ORAL_TABLET | Freq: Once | ORAL | Status: AC
  Administered 2021-12-21: 02:00:00 1000 mg via ORAL
  Filled 2021-12-21: qty 2

## 2021-12-21 NOTE — ED Provider Notes (Signed)
Emergency Medicine Provider Triage Evaluation Note  Karanvir Balderston , a 40 y.o. male  was evaluated in triage.  Pt complains of persistent pain in the L arm and L toe. States pain has been ongoing since being seen in the ED yesterday. No new trauma or injury. Medication from prior ED visit has helped more than Tylenol. Hx alcohol abuse, DM, hypertension, schizoaffective disorder, TBI and homelessness.  Review of Systems  Positive: As above Negative: As above  Physical Exam  BP (!) 182/95 (BP Location: Right Arm)    Pulse 91    Temp 98.2 F (36.8 C) (Oral)    Resp 19    SpO2 98%  Gen:   Awake, no distress   Resp:  Normal effort  MSK:   Moves extremities without difficulty  Other:  DP pulse 2+ b/l.  Medical Decision Making  Medically screening exam initiated at 11:28 PM.  Appropriate orders placed.  Dekota Shenk was informed that the remainder of the evaluation will be completed by another provider, this initial triage assessment does not replace that evaluation, and the importance of remaining in the ED until their evaluation is complete.  Persistent pain - neurovascularly intact   Antony Madura, PA-C 12/21/21 2330    Nira Conn, MD 12/23/21 [phone]

## 2021-12-21 NOTE — Discharge Instructions (Signed)
1. Medications: tylenol or ibuprofen as needed for pain, usual home medications 2. Treatment: rest, drink plenty of fluids, elevate, ice as needed 3. Follow Up: Please followup with your primary doctor in 2-3 days for discussion of your diagnoses and further evaluation after today's visit; if you do not have a primary care doctor use the resource guide provided to find one; Please return to the ER for new or worsening symptoms
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
## 2021-12-21 NOTE — ED Triage Notes (Signed)
Pt c/o left toe pain. Pt states he thinks he has tendonitis.

## 2021-12-21 NOTE — ED Notes (Signed)
Pt discharged from triage and ambulated out of the ED without difficulty.

## 2021-12-21 NOTE — ED Triage Notes (Signed)
Pt here via [facility] for being cold, pt states his hands and feet are numb due to cold. Pt is homeless.

## 2021-12-22 MED ORDER — ACETAMINOPHEN 325 MG PO TABS
650.0000 mg | ORAL_TABLET | Freq: Once | ORAL | Status: AC
  Administered 2021-12-22: 07:00:00 650 mg via ORAL
  Filled 2021-12-22: qty 2
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
## 2021-12-25 ENCOUNTER — Emergency Department (HOSPITAL_COMMUNITY)
Admission: EM | Admit: 2021-12-25 | Discharge: 2021-12-25 | Disposition: A | Discharge status: Home / Self Care | Payer: Medicare (Managed Care) | Attending: Emergency Medicine | Admitting: Emergency Medicine

## 2021-12-25 ENCOUNTER — Emergency Department (HOSPITAL_COMMUNITY)
Admission: EM | Admit: 2021-12-25 | Discharge: 2021-12-25 | Disposition: A | Discharge status: Home / Self Care | Payer: Medicare (Managed Care) | Source: Home / Self Care | Attending: Emergency Medicine | Admitting: Emergency Medicine

## 2021-12-25 ENCOUNTER — Other Ambulatory Visit: Payer: Self-pay

## 2021-12-25 ENCOUNTER — Encounter (HOSPITAL_COMMUNITY): Payer: Self-pay | Admitting: Emergency Medicine

## 2021-12-25 DIAGNOSIS — M25512 Pain in left shoulder: Secondary | ICD-10-CM | POA: Diagnosis not present

## 2021-12-25 DIAGNOSIS — F17210 Nicotine dependence, cigarettes, uncomplicated: Secondary | ICD-10-CM | POA: Insufficient documentation

## 2021-12-25 DIAGNOSIS — I10 Essential (primary) hypertension: Secondary | ICD-10-CM | POA: Insufficient documentation

## 2021-12-25 DIAGNOSIS — E119 Type 2 diabetes mellitus without complications: Secondary | ICD-10-CM | POA: Diagnosis not present

## 2021-12-25 DIAGNOSIS — Z5900 Homelessness unspecified: Secondary | ICD-10-CM | POA: Insufficient documentation

## 2021-12-25 DIAGNOSIS — M79671 Pain in right foot: Secondary | ICD-10-CM | POA: Diagnosis not present

## 2021-12-25 DIAGNOSIS — M2550 Pain in unspecified joint: Secondary | ICD-10-CM | POA: Insufficient documentation

## 2021-12-25 MED ORDER — ACETAMINOPHEN 500 MG PO TABS: 500.0000 mg | ORAL_TABLET | Freq: Four times a day (QID) | ORAL | 0 refills | Status: DC | PRN

## 2021-12-25 MED ORDER — ACETAMINOPHEN 500 MG PO TABS
1000.0000 mg | ORAL_TABLET | Freq: Once | ORAL | Status: DC
  Filled 2021-12-25: qty 2

## 2021-12-25 NOTE — ED Triage Notes (Signed)
Patient [place] with EMS from a restaurant ( homeless) , reports pain at left foot and left shoulder , ambulatory , denies injury , respirations unlabored , seen here yesterday for the same complaints.
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
## 2021-12-25 NOTE — Discharge Instructions (Signed)
Can take tylenol or motrin for pain when needed. Follow-up with your doctor.
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
## 2021-12-25 NOTE — ED Notes (Signed)
Reviewed discharge instructions with patient. Follow-up care and pain management reviewed. Patient verbalized understanding. Patient A&Ox4, VSS, and ambulatory with steady gait upon discharge. 
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
## 2022-01-28 ENCOUNTER — Emergency Department (HOSPITAL_COMMUNITY)
Admission: EM | Admit: 2022-01-28 | Discharge: 2022-01-28 | Discharge status: Against Medical Advice | Payer: Medicare (Managed Care)

## 2022-01-30 ENCOUNTER — Other Ambulatory Visit: Payer: Self-pay

## 2022-01-30 ENCOUNTER — Emergency Department (HOSPITAL_COMMUNITY)
Admission: EM | Admit: 2022-01-30 | Discharge: 2022-01-30 | Disposition: A | Discharge status: Home / Self Care | Payer: Medicare (Managed Care) | Attending: Student | Admitting: Student

## 2022-01-30 DIAGNOSIS — R69 Illness, unspecified: Secondary | ICD-10-CM | POA: Insufficient documentation

## 2022-01-30 DIAGNOSIS — Z5900 Homelessness unspecified: Secondary | ICD-10-CM | POA: Diagnosis not present

## 2022-01-30 NOTE — ED Triage Notes (Signed)
Pt states he is "sick w allergies" x2wks. No other complaints

## 2022-01-30 NOTE — ED Provider Notes (Signed)
[facility] EMERGENCY DEPARTMENT Provider Note   CSN: [id number] Arrival date & time: 01/30/22  [id number]     History  Chief Complaint  Patient presents with   sick    [facility] is a 41 y.o. male.  41yo male came to the ER last night, thought he was having allergies at that time. Symptoms have since resolved and he is requesting dry socks, a snack and a bus pass. No other complaints at this time.      Home Medications Prior to Admission medications   Medication Sig Start Date End Date Taking? Authorizing Provider  acetaminophen (TYLENOL) 500 MG tablet Take 1 tablet (500 mg total) by mouth every 6 (six) hours as needed. 12/25/21   Domenic Moras, PA-C  cetirizine (ZYRTEC ALLERGY) 10 MG tablet Take 1 tablet (10 mg total) by mouth daily. 07/13/21   Hughie Closs, PA-C      Allergies    Shellfish allergy    Review of Systems   Review of Systems  Respiratory:  Negative for shortness of breath.   Cardiovascular:  Negative for chest pain.  Gastrointestinal:  Negative for abdominal pain.  Skin:  Negative for rash and wound.   Physical Exam Updated Vital Signs BP (!) 144/98 (BP Location: Right Arm)    Pulse 73    Temp 99.1 F (37.3 C)    Resp 16    SpO2 100%  Physical Exam Vitals and nursing note reviewed.  Constitutional:      General: He is not in acute distress.    Appearance: He is well-developed. He is not diaphoretic.  HENT:     Head: Normocephalic and atraumatic.  Cardiovascular:     Rate and Rhythm: Normal rate and regular rhythm.     Heart sounds: Normal heart sounds.  Pulmonary:     Effort: Pulmonary effort is normal.     Breath sounds: Normal breath sounds.  Abdominal:     Palpations: Abdomen is soft.     Tenderness: There is no abdominal tenderness.  Musculoskeletal:     Right lower leg: No edema.     Left lower leg: No edema.  Skin:    General: Skin is warm and dry.     Findings: No erythema or rash.  Neurological:     Mental  Status: He is alert and oriented to person, place, and time.  Psychiatric:        Behavior: Behavior normal.    ED Results / Procedures / Treatments   Labs (all labs ordered are listed, but only abnormal results are displayed) Labs Reviewed - No data to display  EKG None  Radiology No results found.  Procedures Procedures    Medications Ordered in ED Medications - No data to display  ED Course/ Medical Decision Making/ A&P Clinical Course as of 01/30/22 0803  Wed Jan 30, 2022  0803 41yo male without complaint, requesting bus pass and meal. Well appearing and ready for dc. [LM]    Clinical Course User Index [LM] Roque Lias                           Medical Decision Making         Final Clinical Impression(s) / ED Diagnoses Final diagnoses:  Homeless    Rx / DC Orders ED Discharge Orders     None         Tacy Learn, PA-C 01/30/22 [id number]  Teressa Lower, MD 01/30/22 1540

## 2022-02-03 ENCOUNTER — Other Ambulatory Visit: Payer: Self-pay

## 2022-02-03 ENCOUNTER — Encounter (HOSPITAL_COMMUNITY): Payer: Self-pay

## 2022-02-03 ENCOUNTER — Emergency Department (HOSPITAL_COMMUNITY)
Admission: EM | Admit: 2022-02-03 | Discharge: 2022-02-03 | Disposition: A | Discharge status: Home / Self Care | Payer: Medicare (Managed Care) | Attending: Emergency Medicine | Admitting: Emergency Medicine

## 2022-02-03 DIAGNOSIS — F10229 Alcohol dependence with intoxication, unspecified: Secondary | ICD-10-CM | POA: Insufficient documentation

## 2022-02-03 DIAGNOSIS — M791 Myalgia, unspecified site: Secondary | ICD-10-CM | POA: Diagnosis present

## 2022-02-03 DIAGNOSIS — Z5900 Homelessness unspecified: Secondary | ICD-10-CM | POA: Diagnosis not present

## 2022-02-03 DIAGNOSIS — R6883 Chills (without fever): Secondary | ICD-10-CM | POA: Insufficient documentation

## 2022-02-03 NOTE — ED Provider Notes (Signed)
[facility]-EMERGENCY DEPT Provider Note   CSN: [id number] Arrival date & time: 02/03/22  0125     History  Chief Complaint  Patient presents with   Alcohol Intoxication   Chills   Generalized Body Aches    [facility] is a 41 y.o. male.  The history is provided by the patient and medical records.  Alcohol Intoxication  41 y.o. M presenting to the ED for generalized body aches.  States his arthritis has flared up again due to the cold.  Denies fever/chills.  EMS reports EtOH on board. Patient is currently homeless, well known to this facility for same.  Home Medications Prior to Admission medications   Medication Sig Start Date End Date Taking? Authorizing Provider  acetaminophen (TYLENOL) 500 MG tablet Take 1 tablet (500 mg total) by mouth every 6 (six) hours as needed. 12/25/21   Fayrene Helper, PA-C  cetirizine (ZYRTEC ALLERGY) 10 MG tablet Take 1 tablet (10 mg total) by mouth daily. 07/13/21   Rushie Chestnut, PA-C      Allergies    Shellfish allergy    Review of Systems   Review of Systems  Musculoskeletal:  Positive for arthralgias.  All other systems reviewed and are negative.  Physical Exam Updated Vital Signs BP 138/81 (BP Location: Left Arm)    Pulse 81    Temp 98.6 F (37 C) (Oral)    Resp 15    Ht 6\' 3"  (1.905 m)    Wt 91 kg    SpO2 99%    BMI 25.08 kg/m   Physical Exam Vitals and nursing note reviewed.  Constitutional:      Appearance: He is well-developed.     Comments: Sleeping in wheelchair, awoken for exam  HENT:     Head: Normocephalic and atraumatic.  Eyes:     Conjunctiva/sclera: Conjunctivae normal.     Pupils: Pupils are equal, round, and reactive to light.  Cardiovascular:     Rate and Rhythm: Normal rate and regular rhythm.     Heart sounds: Normal heart sounds.  Pulmonary:     Effort: Pulmonary effort is normal.     Breath sounds: Normal breath sounds.  Abdominal:     General: Bowel sounds are normal.      Palpations: Abdomen is soft.  Musculoskeletal:        General: Normal range of motion.     Cervical back: Normal range of motion.  Skin:    General: Skin is warm and dry.  Neurological:     Mental Status: He is alert and oriented to person, place, and time.    ED Results / Procedures / Treatments   Labs (all labs ordered are listed, but only abnormal results are displayed) Labs Reviewed - No data to display  EKG None  Radiology No results found.  Procedures Procedures    Medications Ordered in ED Medications - No data to display  ED Course/ Medical Decision Making/ A&P                           Medical Decision Making  41 y.o. M here with reported body aches.  He is currently homeless, seen frequently for same.  VSS.  No acute findings on exam, he appears at baseline.  Do not feel he needs emergent work-up at this time.  Stable for discharge.  Given resource guide for local shelters.  Can return here for new concerns.  Final Clinical  Impression(s) / ED Diagnoses Final diagnoses:  Homeless    Rx / DC Orders ED Discharge Orders     None         [name], PA-C 02/03/22 8185    Nira Conn, MD 02/03/22 [phone]

## 2022-02-03 NOTE — ED Triage Notes (Addendum)
Patient BIB [facility] with generalized body aches and chills. Alcohol on board.   CBG 113

## 2022-02-04 ENCOUNTER — Emergency Department (HOSPITAL_COMMUNITY)
Admission: EM | Admit: 2022-02-04 | Discharge: 2022-02-05 | Disposition: A | Discharge status: Home / Self Care | Payer: Medicare (Managed Care) | Source: Home / Self Care | Attending: Emergency Medicine | Admitting: Emergency Medicine

## 2022-02-04 ENCOUNTER — Other Ambulatory Visit: Payer: Self-pay

## 2022-02-04 ENCOUNTER — Emergency Department (HOSPITAL_COMMUNITY)
Admission: EM | Admit: 2022-02-04 | Discharge: 2022-02-04 | Disposition: A | Discharge status: Home / Self Care | Payer: Medicare (Managed Care) | Attending: Student | Admitting: Student

## 2022-02-04 ENCOUNTER — Encounter (HOSPITAL_COMMUNITY): Payer: Self-pay

## 2022-02-04 ENCOUNTER — Encounter (HOSPITAL_COMMUNITY): Payer: Self-pay | Admitting: Emergency Medicine

## 2022-02-04 DIAGNOSIS — M1990 Unspecified osteoarthritis, unspecified site: Secondary | ICD-10-CM | POA: Diagnosis not present

## 2022-02-04 DIAGNOSIS — E119 Type 2 diabetes mellitus without complications: Secondary | ICD-10-CM | POA: Insufficient documentation

## 2022-02-04 DIAGNOSIS — Z5321 Procedure and treatment not carried out due to patient leaving prior to being seen by health care provider: Secondary | ICD-10-CM | POA: Insufficient documentation

## 2022-02-04 DIAGNOSIS — F17210 Nicotine dependence, cigarettes, uncomplicated: Secondary | ICD-10-CM | POA: Diagnosis not present

## 2022-02-04 DIAGNOSIS — I10 Essential (primary) hypertension: Secondary | ICD-10-CM | POA: Insufficient documentation

## 2022-02-04 DIAGNOSIS — Z5900 Homelessness unspecified: Secondary | ICD-10-CM | POA: Diagnosis not present

## 2022-02-04 DIAGNOSIS — R0981 Nasal congestion: Secondary | ICD-10-CM | POA: Diagnosis present

## 2022-02-04 DIAGNOSIS — F10129 Alcohol abuse with intoxication, unspecified: Secondary | ICD-10-CM | POA: Insufficient documentation

## 2022-02-04 MED ORDER — ACETAMINOPHEN 500 MG PO TABS
1000.0000 mg | ORAL_TABLET | Freq: Once | ORAL | Status: AC
  Administered 2022-02-04: 1000 mg via ORAL
  Filled 2022-02-04: qty 2

## 2022-02-04 NOTE — ED Triage Notes (Signed)
Pt c/o nasal congestion.
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
## 2022-02-04 NOTE — ED Triage Notes (Signed)
Pt BIB [facility] for eval of ETOH intox from [employer] [facility]. Sleeping on floor in bathroom.

## 2022-02-07 ENCOUNTER — Emergency Department (HOSPITAL_COMMUNITY)
Admission: EM | Admit: 2022-02-07 | Discharge: 2022-02-07 | Discharge status: Against Medical Advice | Payer: Medicare (Managed Care) | Attending: Emergency Medicine | Admitting: Emergency Medicine

## 2022-02-07 DIAGNOSIS — M79643 Pain in unspecified hand: Secondary | ICD-10-CM | POA: Insufficient documentation

## 2022-02-07 DIAGNOSIS — Z5321 Procedure and treatment not carried out due to patient leaving prior to being seen by health care provider: Secondary | ICD-10-CM | POA: Diagnosis not present

## 2022-02-07 NOTE — ED Triage Notes (Signed)
Please see downtime charting from patients arrival until 0630

## 2022-02-07 NOTE — ED Notes (Signed)
Pt didn't answered when called for vitals  

## 2022-02-08 ENCOUNTER — Emergency Department (HOSPITAL_COMMUNITY)
Admission: EM | Admit: 2022-02-08 | Discharge: 2022-02-08 | Discharge status: Against Medical Advice | Payer: Medicare (Managed Care) | Attending: Physician Assistant | Admitting: Physician Assistant

## 2022-02-08 ENCOUNTER — Other Ambulatory Visit: Payer: Self-pay
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
## 2022-02-09 ENCOUNTER — Emergency Department (HOSPITAL_COMMUNITY)
Admission: EM | Admit: 2022-02-09 | Discharge: 2022-02-10 | Disposition: A | Discharge status: Home / Self Care | Payer: Medicare (Managed Care) | Attending: Emergency Medicine | Admitting: Emergency Medicine

## 2022-02-09 ENCOUNTER — Encounter (HOSPITAL_COMMUNITY): Payer: Self-pay | Admitting: *Deleted

## 2022-02-09 ENCOUNTER — Other Ambulatory Visit: Payer: Self-pay

## 2022-02-09 DIAGNOSIS — M79673 Pain in unspecified foot: Secondary | ICD-10-CM | POA: Diagnosis not present

## 2022-02-09 DIAGNOSIS — M549 Dorsalgia, unspecified: Secondary | ICD-10-CM | POA: Insufficient documentation

## 2022-02-09 DIAGNOSIS — I10 Essential (primary) hypertension: Secondary | ICD-10-CM | POA: Insufficient documentation

## 2022-02-09 DIAGNOSIS — M7989 Other specified soft tissue disorders: Secondary | ICD-10-CM | POA: Insufficient documentation

## 2022-02-09 DIAGNOSIS — T730XXA Starvation, initial encounter: Secondary | ICD-10-CM | POA: Diagnosis present

## 2022-02-09 DIAGNOSIS — Z5900 Homelessness unspecified: Secondary | ICD-10-CM | POA: Insufficient documentation

## 2022-02-09 NOTE — ED Triage Notes (Signed)
No answer

## 2022-02-09 NOTE — ED Notes (Signed)
Went to bedside with pt. Attempted to do an assessment and VS on the pt and pt refused to answer any questions and refused VS at this time

## 2022-02-09 NOTE — ED Triage Notes (Signed)
The pt is homeless and is here almost every night tonight he is c/o tendonitis in his fingers and toes from walking the strets

## 2022-02-10 ENCOUNTER — Encounter (HOSPITAL_COMMUNITY): Payer: Self-pay | Admitting: *Deleted

## 2022-02-10 ENCOUNTER — Other Ambulatory Visit: Payer: Self-pay

## 2022-02-10 ENCOUNTER — Encounter (HOSPITAL_COMMUNITY): Payer: Self-pay | Admitting: Emergency Medicine

## 2022-02-10 ENCOUNTER — Emergency Department (HOSPITAL_COMMUNITY)
Admission: EM | Admit: 2022-02-10 | Discharge: 2022-02-10 | Disposition: A | Discharge status: Home / Self Care | Payer: Medicare (Managed Care) | Source: Home / Self Care | Attending: Emergency Medicine | Admitting: Emergency Medicine

## 2022-02-10 ENCOUNTER — Emergency Department (HOSPITAL_COMMUNITY)
Admission: EM | Admit: 2022-02-10 | Discharge: 2022-02-11 | Disposition: A | Discharge status: Home / Self Care | Payer: Medicare (Managed Care) | Source: Home / Self Care | Attending: Emergency Medicine | Admitting: Emergency Medicine

## 2022-02-10 DIAGNOSIS — M79673 Pain in unspecified foot: Secondary | ICD-10-CM | POA: Diagnosis not present

## 2022-02-10 DIAGNOSIS — M7989 Other specified soft tissue disorders: Secondary | ICD-10-CM | POA: Insufficient documentation

## 2022-02-10 DIAGNOSIS — Z5900 Homelessness unspecified: Secondary | ICD-10-CM | POA: Insufficient documentation

## 2022-02-10 DIAGNOSIS — M549 Dorsalgia, unspecified: Secondary | ICD-10-CM | POA: Insufficient documentation

## 2022-02-10 DIAGNOSIS — M79672 Pain in left foot: Secondary | ICD-10-CM

## 2022-02-10 MED ORDER — ACETAMINOPHEN 325 MG PO TABS
650.0000 mg | ORAL_TABLET | Freq: Once | ORAL | Status: DC
  Filled 2022-02-10: qty 2

## 2022-02-10 NOTE — ED Provider Notes (Signed)
°  [facility] EMERGENCY DEPARTMENT Provider Note   CSN: [id number] Arrival date & time: 02/10/22  9826     History  Chief Complaint  Patient presents with   Homeless    Nathan Norton is a 41 y.o. male well-known to the ED, discharged 4 hours ago presenting back requesting a sandwich, Tylenol or ibuprofen, a bus pass and concerns for anemia.  He states "I have all the same stuff is usually going on.  If you feed me and give me a bus pass I will be on my way."  HPI     Home Medications Prior to Admission medications   Medication Sig Start Date End Date Taking? Authorizing Provider  acetaminophen (TYLENOL) 500 MG tablet Take 1 tablet (500 mg total) by mouth every 6 (six) hours as needed. 12/25/21   Fayrene Helper, PA-C  cetirizine (ZYRTEC ALLERGY) 10 MG tablet Take 1 tablet (10 mg total) by mouth daily. 07/13/21   Rushie Chestnut, PA-C      Allergies    Shellfish allergy    Review of Systems   Review of Systems  Physical Exam Updated Vital Signs BP 128/81    Pulse 72    Resp 16    SpO2 98%  Physical Exam Vitals and nursing note reviewed.  Constitutional:      Appearance: Normal appearance.  HENT:     Head: Normocephalic and atraumatic.  Eyes:     General: No scleral icterus.    Conjunctiva/sclera: Conjunctivae normal.  Pulmonary:     Effort: Pulmonary effort is normal. No respiratory distress.  Skin:    Findings: No rash.  Neurological:     Mental Status: He is alert.  Psychiatric:        Mood and Affect: Mood normal.    ED Results / Procedures / Treatments   Labs (all labs ordered are listed, but only abnormal results are displayed) Labs Reviewed - No data to display  EKG None  Radiology No results found.  Procedures Procedures   Medications Ordered in ED Medications  acetaminophen (TYLENOL) tablet 650 mg (has no administration in time range)    ED Course/ Medical Decision Making/ A&P                           Medical  Decision Making Risk OTC drugs.   41 year old with a history of schizoaffective disorder, homelessness, malingering presenting back to the ED asking for a sandwich and a bus pass.  Discharged earlier this morning.  He mentioned a concern for anemia however was unable to tell me why he thinks he may be anemic. I do not believe blood work is warranted at this time.  Social work will arrive around 8 AM and as soon as the bus passes obtained he will be discharged.  Patient given bus pass by Diplomatic [employer] operational officer.  Discharged at this time.  Final Clinical Impression(s) / ED Diagnoses Final diagnoses:  Homelessness    Rx / DC Orders Results and diagnoses were explained to the patient. Return precautions discussed in full. Patient had no additional questions and expressed complete understanding.   This chart was dictated using voice recognition software.  Despite best efforts to proofread,  errors can occur which can change the documentation meaning.    Woodroe Chen 02/10/22 1131    Terrilee Files, MD 02/10/22 1731

## 2022-02-10 NOTE — ED Provider Notes (Signed)
[facility] EMERGENCY DEPARTMENT Provider Note   CSN: [id number] Arrival date & time: 02/09/22  2126     History  Chief Complaint: Hungry   Nathan Norton is a 41 y.o. male with a hx of schizoaffective disorder, hypertension, homelessness presents to the ED with complaints of "I want something to eat." Patient reports he checked in because he is hungry, he is requesting a bagged lunch and a glass of warm milk. He denies other complaints to me, mentioned pain to triage denies currently.   HPI     Home Medications Prior to Admission medications   Medication Sig Start Date End Date Taking? Authorizing Provider  acetaminophen (TYLENOL) 500 MG tablet Take 1 tablet (500 mg total) by mouth every 6 (six) hours as needed. 12/25/21   Fayrene Helper, PA-C  cetirizine (ZYRTEC ALLERGY) 10 MG tablet Take 1 tablet (10 mg total) by mouth daily. 07/13/21   Rushie Chestnut, PA-C      Allergies    Shellfish allergy    Review of Systems   Review of Systems  Constitutional:  Negative for chills and fever.  Respiratory:  Negative for shortness of breath.   Cardiovascular:  Negative for chest pain.  Musculoskeletal:  Negative for arthralgias.  All other systems reviewed and are negative.  Physical Exam Updated Vital Signs BP (!) 147/106 (BP Location: Right Arm)    Pulse (!) 120    Temp 99 F (37.2 C) (Oral)    Resp 20    Ht 6\' 3"  (1.905 m)    Wt 92 kg    SpO2 99%    BMI 25.35 kg/m  Physical Exam Vitals and nursing note reviewed.  Constitutional:      General: He is not in acute distress.    Appearance: He is well-developed.  HENT:     Head: Normocephalic and atraumatic.  Eyes:     General:        Right eye: No discharge.        Left eye: No discharge.     Conjunctiva/sclera: Conjunctivae normal.  Cardiovascular:     Rate and Rhythm: Normal rate and regular rhythm.     Comments: 2+ symmetric radial pulses bilaterally. Pulmonary:     Effort: Pulmonary effort is  normal.  Abdominal:     General: There is no distension.     Tenderness: There is no abdominal tenderness. There is no guarding or rebound.  Musculoskeletal:     Comments: Moving all extremities.  The digits throughout the hands.  No focal bony tenderness to palpation  Skin:    General: Skin is warm and dry.     Capillary Refill: Capillary refill takes less than 2 seconds.  Neurological:     Mental Status: He is alert.     Comments: Clear speech.   Psychiatric:        Behavior: Behavior normal.        Thought Content: Thought content normal.    ED Results / Procedures / Treatments   Labs (all labs ordered are listed, but only abnormal results are displayed) Labs Reviewed - No data to display  EKG None  Radiology No results found.  Procedures Procedures    Medications Ordered in ED Medications - No data to display  ED Course/ Medical Decision Making/ A&P                           Medical Decision Making  Patient  well-known to the emergency department presents requesting a bag lunch and glass of warm milk.  Arrival he was tachycardic and hypertensive, repeat vital signs have improved, current heart rate is 75 bpm on my assessment.  He has an overall benign physical exam.  He was provided with a snack in the emergency department and allowed to rest and is feeling improved.  He is well-known to the ED and appears at his baseline, low suspicion for emergent pathology at this time, appropriate for discharge   Final Clinical Impression(s) / ED Diagnoses Final diagnoses:  Hungry, initial encounter    Rx / DC Orders ED Discharge Orders     None         Cherly Anderson, PA-C 02/10/22 0219    Sloan Leiter, DO 02/10/22 [phone]

## 2022-02-10 NOTE — Discharge Instructions (Signed)
If you are in need of a shelter please call [facility] ([facility]) at [phone] between the hours of 9am-5pm Mon-Fri.  [facility] will contact all the local shelters to find openings.  Right now they are requiring people to quarantine at a hotel before they can go to an open bed but [facility] may be able to set that up as well.  They are not open on weekends.  On Monday-Friday morning at 8 am until 1 pm, you can also go to the [facility] (see below) to seek shelter in the Hotel Quarantine Program prior to entering a shelter. You can also call the number provided (see the above paragraph) to seek placement into the program by calling [facility].   [facility] [address] [place], [place] [address] Phone: [phone] Fax: [phone]  For Free Breakfasts and Lunches 7 days a week you can go to: [facility] [address], [place], [place] [address] Hours: Open today  8AM-5PM Phone: [phone] Breakfast: 6:30-7:30 am Lunch served: 10:40 am - 12:40pm         [facility]) M-F 8am-3pm   [address] [place], [place] [address]   [phone] Services include: laundry, barbering, support groups, case management, phone  & computer access, showers, AA/NA mtgs, mental health/substance abuse nurse, job skills class, disability information, VA assistance, spiritual classes, etc.   [facility] [address] [place], [place]   [phone] Provides breakfast each weekday morning except Wednesdays, and an evening community meal every Friday. Access to showers is available during breakfast hours and telephones for seeking work are also provided. Also offers job referral and counseling for the homeless and unemployed.  [facility]   [facility] [address]     [facility] [place], [place] [address]     [address], [place]  [phone]      [phone]  [facility]   [facility] [address]    [address] [place] [address]     [place], [place] [address] [phone]       [phone]  [facility] (women only) [address] [place], [place] [address] [phone]  [facility]: [place] (overflow shelter: [address] [place], [place] check in at 6pm for placement in local shelter).  [address], [place], [place] [address] Phone: [phone]  [facility]     [facility]   Crisis Services      [place] [facility] [phone]      [phone] [address]     [address] [place], [place] [address]     [place], [place] [address]    [facility] - [phone]  

## 2022-02-10 NOTE — Progress Notes (Signed)
CSW received consult for bus pass and homelessness. CSW provided patient with bus pass and added homelessness resources.

## 2022-02-10 NOTE — ED Notes (Signed)
Attempted again due to do an assessment and get VS. Pt ignored me and would close his eyes and turn his head. Went over d/c papers with pt and he kept his eyes closed and didn't acknowledge anything. Attempted to get him to leave multiple times and pt ignored this Charity fundraiser. Security was made aware and is at bedside at this time

## 2022-02-10 NOTE — ED Triage Notes (Signed)
Pt requesting tylenol "that I wasn't given earlier and a bus ticket." Pt was discharged at 0330 tonight

## 2022-02-10 NOTE — ED Triage Notes (Signed)
Pt c/o back pain and states he is wet [place]

## 2022-02-11 MED ORDER — ACETAMINOPHEN 325 MG PO TABS
650.0000 mg | ORAL_TABLET | Freq: Once | ORAL | Status: AC
Start: 2022-02-11 — End: 2022-02-11
  Administered 2022-02-11: 650 mg via ORAL
  Filled 2022-02-11: qty 2

## 2022-02-11 NOTE — Discharge Instructions (Signed)
Get help right away if: °Your foot is numb or tingling. °Your foot or toes are swollen. °Your foot or toes turn white or blue. °You have warmth and redness along your foot. °

## 2022-02-11 NOTE — ED Notes (Signed)
Pt given a sandwich and a couple pairs of dry socks. Pt discharged with no issues identified by this nurse

## 2022-02-11 NOTE — ED Provider Notes (Signed)
[facility] EMERGENCY DEPARTMENT Provider Note   CSN: [id number] Arrival date & time: 02/10/22  2246     History  No chief complaint on file.   Nathan Norton is a 41 y.o. male here for feet pain. He is well known to this ED. This is his 28 visit in the last 6 months.Patient's clothing is wet. He states that his feet are infected and sore. He denies fever.  When asked about his back pain he states "it is just a little spine pain of been having for a long time."  He has no new complaints tonight.  He denies any red flag symptoms.   HPI     Home Medications Prior to Admission medications   Medication Sig Start Date End Date Taking? Authorizing Provider  acetaminophen (TYLENOL) 500 MG tablet Take 1 tablet (500 mg total) by mouth every 6 (six) hours as needed. 12/25/21   Fayrene Helper, PA-C  cetirizine (ZYRTEC ALLERGY) 10 MG tablet Take 1 tablet (10 mg total) by mouth daily. 07/13/21   Rushie Chestnut, PA-C      Allergies    Shellfish allergy    Review of Systems   Review of Systems  Physical Exam Updated Vital Signs BP 131/83 (BP Location: Right Wrist)    Pulse 91    Temp 98.5 F (36.9 C) (Oral)    Resp 16    SpO2 98%  Physical Exam Vitals and nursing note reviewed.  Constitutional:      General: He is not in acute distress.    Appearance: He is well-developed. He is not diaphoretic.  HENT:     Head: Normocephalic and atraumatic.  Eyes:     General: No scleral icterus.    Conjunctiva/sclera: Conjunctivae normal.  Cardiovascular:     Rate and Rhythm: Normal rate and regular rhythm.     Heart sounds: Normal heart sounds.  Pulmonary:     Effort: Pulmonary effort is normal. No respiratory distress.     Breath sounds: Normal breath sounds.  Abdominal:     Palpations: Abdomen is soft.     Tenderness: There is no abdominal tenderness.  Musculoskeletal:     Cervical back: Normal range of motion and neck supple.     Comments: No midline spinal  tenderness full range of motion of the lumbar spine, normal leg strength and reflexes  Skin:    General: Skin is warm and dry.     Comments: Wet socks removed, bilateral calluses are macerated without any evidence of infection, no wounds or tissue breakdown, no erythema or swelling  Neurological:     Mental Status: He is alert.  Psychiatric:        Behavior: Behavior normal.    ED Results / Procedures / Treatments   Labs (all labs ordered are listed, but only abnormal results are displayed) Labs Reviewed - No data to display  EKG None  Radiology No results found.  Procedures Procedures    Medications Ordered in ED Medications - No data to display  ED Course/ Medical Decision Making/ A&P                           Medical Decision Making Patient here with complaint of back pain and foot swelling.  I suspect that the patient wanted to get out of the cold and rain He has no evidence of foot infection on clinical examination.  He is normotensive without tachycardia or fever.  He has no red flag symptoms with his back.  Will give Tylenol and plan for discharge.  We will supply the patient with dry socks. Final Clinical Impression(s) / ED Diagnoses Final diagnoses:  None    Rx / DC Orders ED Discharge Orders     None         Arthor Captain, PA-C 02/11/22 0020    Gilda Crease, MD 02/12/22 [phone]

## 2022-02-16 ENCOUNTER — Other Ambulatory Visit: Payer: Self-pay

## 2022-02-16 ENCOUNTER — Encounter (HOSPITAL_COMMUNITY): Payer: Self-pay | Admitting: Emergency Medicine

## 2022-02-16 ENCOUNTER — Emergency Department (HOSPITAL_COMMUNITY)
Admission: EM | Admit: 2022-02-16 | Discharge: 2022-02-16 | Disposition: A | Discharge status: Home / Self Care | Payer: Medicare (Managed Care) | Attending: Emergency Medicine | Admitting: Emergency Medicine

## 2022-02-16 ENCOUNTER — Ambulatory Visit (HOSPITAL_COMMUNITY)
Admission: EM | Admit: 2022-02-16 | Discharge: 2022-02-16 | Disposition: A | Discharge status: Home / Self Care | Payer: Medicare (Managed Care)

## 2022-02-16 DIAGNOSIS — M79675 Pain in left toe(s): Secondary | ICD-10-CM

## 2022-02-16 DIAGNOSIS — Z5900 Homelessness unspecified: Secondary | ICD-10-CM | POA: Diagnosis not present

## 2022-02-16 MED ORDER — NAPROXEN 250 MG PO TABS: 500.0000 mg | ORAL_TABLET | Freq: Once | ORAL | Status: DC

## 2022-02-16 NOTE — ED Triage Notes (Signed)
Patient here with right great toe pain.  States it has been going on for a long time, months.

## 2022-02-16 NOTE — [facility] Counselor (Signed)
Triage note:   41 year old  Nathan Norton came into the [facility] requesting to be seen. When TTS attempted to assess patient he reported, "I don't want to be seen. I am homeless and just wanted to get out the cold." Patient refused to sign Waiver of Medical Screening Examination. He refused to talk. Patient left without being seen.

## 2022-02-16 NOTE — ED Notes (Signed)
PT called for triage X3 with no response. Will attempt again shortly °

## 2022-02-16 NOTE — ED Provider Notes (Signed)
[facility] EMERGENCY DEPARTMENT Provider Note   CSN: [id number] Arrival date & time: 02/16/22  0245     History  Chief Complaint  Patient presents with   Toe Pain    Nathan Norton is a 41 y.o. male.  41 year old male presents to the emergency department for complaints of LEFT great toe pain. Pain has been present for months; chronic, unchanged. Denies any modifying symptoms of pain.  No fevers.  Has not taken any medications for symptoms.  Patient is homeless.  The history is provided by the patient. No language interpreter was used.  Toe Pain      Home Medications Prior to Admission medications   Medication Sig Start Date End Date Taking? Authorizing Provider  acetaminophen (TYLENOL) 500 MG tablet Take 1 tablet (500 mg total) by mouth every 6 (six) hours as needed. 12/25/21   Fayrene Helper, PA-C  cetirizine (ZYRTEC ALLERGY) 10 MG tablet Take 1 tablet (10 mg total) by mouth daily. 07/13/21   Rushie Chestnut, PA-C      Allergies    Shellfish allergy    Review of Systems   Review of Systems Ten systems reviewed and are negative for acute change, except as noted in the HPI.    Physical Exam Updated Vital Signs BP (!) 141/96 (BP Location: Right Arm)    Pulse 92    Temp 98 F (36.7 C) (Oral)    Resp 16    SpO2 97%   Physical Exam Vitals and nursing note reviewed.  Constitutional:      General: He is not in acute distress.    Appearance: He is well-developed. He is not diaphoretic.     Comments: Disheveled appearing.  In no acute distress.  HENT:     Head: Normocephalic and atraumatic.  Eyes:     General: No scleral icterus.    Conjunctiva/sclera: Conjunctivae normal.  Cardiovascular:     Rate and Rhythm: Normal rate and regular rhythm.     Pulses: Normal pulses.  Pulmonary:     Effort: Pulmonary effort is normal. No respiratory distress.  Musculoskeletal:        General: Normal range of motion.     Cervical back: Normal range of motion.      Comments: Tourniquet was applied at the base of the left great toe.  This was removed.  The digit is warm and well-perfused with preserved capillary refill.  No associated erythema, heat to touch, lymphangitic streaking, tenderness to palpation.  Skin:    General: Skin is warm and dry.     Coloration: Skin is not pale.     Findings: No erythema or rash.  Neurological:     Mental Status: He is alert and oriented to person, place, and time.     Coordination: Coordination normal.     Comments: Ambulatory with steady gait  Psychiatric:        Behavior: Behavior normal.    ED Results / Procedures / Treatments   Labs (all labs ordered are listed, but only abnormal results are displayed) Labs Reviewed - No data to display  EKG None  Radiology No results found.  Procedures Procedures    Medications Ordered in ED Medications  naproxen (NAPROSYN) tablet 500 mg (has no administration in time range)    ED Course/ Medical Decision Making/ A&P                           Medical Decision  Making Risk Prescription drug management.   This patient presents to the ED for concern of L great toe pain, this involves an extensive number of treatment options, and is a complaint that carries with it a high risk of complications and morbidity.  The differential diagnosis includes paronychia vs arthritis vs fx vs contusion vs cellulitis   Co morbidities that complicate the patient evaluation  Hx TBI Schizoaffective d/o   Additional history obtained:  External records from outside source obtained and reviewed including prior left great toe Xray from 12/11/21.   Medicines ordered and prescription drug management:  I ordered medication including Naproxen for pain control  Reevaluation of the patient after these medicines showed that the patient improved I have reviewed the patients home medicines and have made adjustments as needed   Test Considered:  Repeat  Xray   Reevaluation:  After the interventions noted above, I reevaluated the patient and found that they have :improved   Social Determinants of Health:  Homelessness    Dispostion:  After consideration of the diagnostic results and the patients response to treatment, I feel that the patent would benefit from PCP follow up.  Patient presents to the emergency department for evaluation of chronic R great toe pain. Patient neurovascularly intact on exam. Prior imaging reviewed which was negative for fracture, dislocation, bony deformity. No swelling, erythema, heat to touch to the affected area; no concern for septic joint. Compartments in the affected extremity are soft. Plan for supportive management including RICE and NSAIDs; primary care follow up as needed. Return precautions discussed and provided. Patient discharged in stable condition with no unaddressed concerns..          Final Clinical Impression(s) / ED Diagnoses Final diagnoses:  Toe pain, left    Rx / DC Orders ED Discharge Orders     None         Antony Madura, PA-C 02/16/22 0421    Zadie Rhine, MD 02/16/22 [phone]

## 2022-02-17 ENCOUNTER — Emergency Department (HOSPITAL_COMMUNITY)
Admission: EM | Admit: 2022-02-17 | Discharge: 2022-02-18 | Disposition: A | Discharge status: Home / Self Care | Payer: Medicare (Managed Care) | Attending: Emergency Medicine | Admitting: Emergency Medicine

## 2022-02-17 ENCOUNTER — Other Ambulatory Visit: Payer: Self-pay

## 2022-02-17 DIAGNOSIS — M79673 Pain in unspecified foot: Secondary | ICD-10-CM

## 2022-02-17 DIAGNOSIS — M79671 Pain in right foot: Secondary | ICD-10-CM | POA: Diagnosis not present

## 2022-02-17 DIAGNOSIS — M79672 Pain in left foot: Secondary | ICD-10-CM | POA: Diagnosis present

## 2022-02-17 DIAGNOSIS — M79676 Pain in unspecified toe(s): Secondary | ICD-10-CM | POA: Insufficient documentation

## 2022-02-17 DIAGNOSIS — H5789 Other specified disorders of eye and adnexa: Secondary | ICD-10-CM | POA: Diagnosis not present

## 2022-02-17 DIAGNOSIS — G8929 Other chronic pain: Secondary | ICD-10-CM

## 2022-02-17 NOTE — ED Notes (Signed)
Pt not in room at this time

## 2022-02-17 NOTE — ED Triage Notes (Signed)
Pt c/o toe pain. Pt states that this is a chronic issue.

## 2022-02-18 ENCOUNTER — Emergency Department (HOSPITAL_COMMUNITY)
Admission: EM | Admit: 2022-02-18 | Discharge: 2022-02-18 | Discharge status: Against Medical Advice | Payer: Medicare (Managed Care) | Source: Home / Self Care | Attending: Emergency Medicine | Admitting: Emergency Medicine

## 2022-02-18 ENCOUNTER — Encounter (HOSPITAL_COMMUNITY): Payer: Self-pay | Admitting: Emergency Medicine

## 2022-02-18 DIAGNOSIS — Z8669 Personal history of other diseases of the nervous system and sense organs: Secondary | ICD-10-CM

## 2022-02-18 DIAGNOSIS — M79676 Pain in unspecified toe(s): Secondary | ICD-10-CM | POA: Insufficient documentation

## 2022-02-18 DIAGNOSIS — M79672 Pain in left foot: Secondary | ICD-10-CM | POA: Diagnosis not present

## 2022-02-18 DIAGNOSIS — H5789 Other specified disorders of eye and adnexa: Secondary | ICD-10-CM | POA: Insufficient documentation

## 2022-02-18 MED ORDER — LORATADINE 10 MG PO TABS: 10.0000 mg | ORAL_TABLET | Freq: Every day | ORAL | 0 refills | Status: DC

## 2022-02-18 MED ORDER — LORATADINE 10 MG PO TABS
10.0000 mg | ORAL_TABLET | Freq: Once | ORAL | Status: AC
  Administered 2022-02-18: 10 mg via ORAL
  Filled 2022-02-18: qty 1

## 2022-02-18 MED ORDER — ACETAMINOPHEN 325 MG PO TABS
650.0000 mg | ORAL_TABLET | Freq: Once | ORAL | Status: AC
  Administered 2022-02-18: 650 mg via ORAL
  Filled 2022-02-18: qty 2

## 2022-02-18 NOTE — ED Provider Notes (Signed)
[facility] EMERGENCY DEPARTMENT Provider Note   CSN: [id number] Arrival date & time: 02/17/22  2218     History  Chief Complaint  Patient presents with   Toe Pain    Nathan Norton is a 41 y.o. male.  Who is well-known to this emergency department.  He has past medical history of homelessness and malingering.  Patient complains of toe pain "on the left... And on the right."  He denies fevers, chills or injury   Toe Pain      Home Medications Prior to Admission medications   Medication Sig Start Date End Date Taking? Authorizing Provider  acetaminophen (TYLENOL) 500 MG tablet Take 1 tablet (500 mg total) by mouth every 6 (six) hours as needed. 12/25/21   Fayrene Helper, PA-C  cetirizine (ZYRTEC ALLERGY) 10 MG tablet Take 1 tablet (10 mg total) by mouth daily. 07/13/21   Rushie Chestnut, PA-C      Allergies    Shellfish allergy    Review of Systems   Review of Systems  Physical Exam Updated Vital Signs BP 125/87 (BP Location: Right Arm)    Pulse 85    Temp 98.6 F (37 C) (Oral)    Resp 16    Ht 6\' 3"  (1.905 m)    Wt 99.8 kg    SpO2 98%    BMI 27.50 kg/m  Physical Exam Vitals and nursing note reviewed.  Constitutional:      General: He is not in acute distress.    Appearance: He is well-developed. He is not diaphoretic.  HENT:     Head: Normocephalic and atraumatic.  Eyes:     General: No scleral icterus.    Conjunctiva/sclera: Conjunctivae normal.  Cardiovascular:     Rate and Rhythm: Normal rate and regular rhythm.     Heart sounds: Normal heart sounds.  Pulmonary:     Effort: Pulmonary effort is normal. No respiratory distress.     Breath sounds: Normal breath sounds.  Abdominal:     Palpations: Abdomen is soft.     Tenderness: There is no abdominal tenderness.  Musculoskeletal:     Cervical back: Normal range of motion and neck supple.     Comments: Bilateral feet with DP and PT pulse 2+, feet are clean and there is no evidence of tinea  or erythema, toes are nontender to palpation, and no evidence of cellulitis or infection.  Skin:    General: Skin is warm and dry.  Neurological:     Mental Status: He is alert.  Psychiatric:        Behavior: Behavior normal.    ED Results / Procedures / Treatments   Labs (all labs ordered are listed, but only abnormal results are displayed) Labs Reviewed - No data to display  EKG None  Radiology No results found.  Procedures Procedures    Medications Ordered in ED Medications  acetaminophen (TYLENOL) tablet 650 mg (has no administration in time range)    ED Course/ Medical Decision Making/ A&P                           Medical Decision Making Risk OTC drugs.  Patient with normal-appearing feet, no evidence of infection, no injuries, I considered getting an x-ray however this is not indicated due to lack of evidence of injury.  Patient appears appropriate for discharge.  Given Tylenol. Final Clinical Impression(s) / ED Diagnoses Final diagnoses:  Chronic foot pain, unspecified  laterality    Rx / DC Orders ED Discharge Orders     None         Arthor Captain, PA-C 02/18/22 Conley Canal, MD 02/18/22 [phone]

## 2022-02-18 NOTE — ED Notes (Signed)
Provider at bedside

## 2022-02-18 NOTE — ED Notes (Signed)
Pt left assigned bed and walked out of the emergency department. Pt did inform staff in the lobby that he was leaving. Dr. Mort Sawyers informed of his departure

## 2022-02-18 NOTE — ED Notes (Signed)
Per pt here due to bilat feet pain that is a chronic issue

## 2022-02-18 NOTE — ED Triage Notes (Signed)
Patient requesting he be given Claritin because his eyes itch. Patient also complains of pain on his left foot, denies recent injury. Patient is alert, oriented, and in no apparent distress at this time.

## 2022-02-18 NOTE — Discharge Instructions (Signed)
Get help right away if you have signs of infection like pus, redness or sever swelling
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
## 2022-02-19 ENCOUNTER — Other Ambulatory Visit: Payer: Self-pay

## 2022-02-19 ENCOUNTER — Emergency Department (HOSPITAL_COMMUNITY)
Admission: EM | Admit: 2022-02-19 | Discharge: 2022-02-19 | Disposition: A | Discharge status: Home / Self Care | Payer: Medicare (Managed Care) | Source: Home / Self Care | Attending: Emergency Medicine | Admitting: Emergency Medicine

## 2022-02-19 ENCOUNTER — Emergency Department (HOSPITAL_COMMUNITY)
Admission: EM | Admit: 2022-02-19 | Discharge: 2022-02-19 | Disposition: A | Discharge status: Home / Self Care | Payer: Medicare (Managed Care) | Attending: Emergency Medicine | Admitting: Emergency Medicine

## 2022-02-19 ENCOUNTER — Encounter (HOSPITAL_COMMUNITY): Payer: Self-pay

## 2022-02-19 ENCOUNTER — Encounter (HOSPITAL_COMMUNITY): Payer: Self-pay | Admitting: Emergency Medicine

## 2022-02-19 DIAGNOSIS — R030 Elevated blood-pressure reading, without diagnosis of hypertension: Secondary | ICD-10-CM

## 2022-02-19 DIAGNOSIS — Z79899 Other long term (current) drug therapy: Secondary | ICD-10-CM | POA: Insufficient documentation

## 2022-02-19 DIAGNOSIS — Z760 Encounter for issue of repeat prescription: Secondary | ICD-10-CM

## 2022-02-19 DIAGNOSIS — R52 Pain, unspecified: Secondary | ICD-10-CM

## 2022-02-19 DIAGNOSIS — Z0130 Encounter for examination of blood pressure without abnormal findings: Secondary | ICD-10-CM | POA: Insufficient documentation

## 2022-02-19 DIAGNOSIS — M1990 Unspecified osteoarthritis, unspecified site: Secondary | ICD-10-CM | POA: Insufficient documentation

## 2022-02-19 MED ORDER — CLOTRIMAZOLE 1 % EX CREA: TOPICAL_CREAM | CUTANEOUS | 0 refills | Status: DC

## 2022-02-19 MED ORDER — ACETAMINOPHEN 325 MG PO TABS
650.0000 mg | ORAL_TABLET | Freq: Once | ORAL | Status: AC
  Administered 2022-02-19: 650 mg via ORAL
  Filled 2022-02-19: qty 2

## 2022-02-19 NOTE — ED Provider Notes (Signed)
[facility] EMERGENCY DEPARTMENT Provider Note   CSN: [id number] Arrival date & time: 02/19/22  0846     History  Chief Complaint  Patient presents with   Arthritis    Nathan Norton is a 41 y.o. male.  41 y.o male with no PMH present to the ED with a chief complaint of arthritis pain x some time. Patient was evaluated in the ED earlier yesterday, for a refill on blood pressure medication although he does not have any prior history of hypertension.  On today's visit, he is requesting medication for his arthritis which she has had for a long time, and states "I just need some Tylenol ".  He is also requesting a bus pass, blanket, shelter.  And is under no distress.  Without any chest pain, shortness of breath, abdominal pain.  The history is provided by the patient and medical records.  Arthritis      Home Medications Prior to Admission medications   Medication Sig Start Date End Date Taking? Authorizing Provider  acetaminophen (TYLENOL) 500 MG tablet Take 1 tablet (500 mg total) by mouth every 6 (six) hours as needed. 12/25/21   Fayrene Helper, PA-C  cetirizine (ZYRTEC ALLERGY) 10 MG tablet Take 1 tablet (10 mg total) by mouth daily. 07/13/21   Rushie Chestnut, PA-C  loratadine (CLARITIN) 10 MG tablet Take 1 tablet (10 mg total) by mouth daily. 02/18/22 03/20/22  Edison Simon, MD      Allergies    Shellfish allergy    Review of Systems   Review of Systems  Constitutional:  Negative for fever.  Musculoskeletal:  Positive for arthralgias and arthritis.   Physical Exam Updated Vital Signs BP 114/83    Pulse 71    Temp 98.6 F (37 C) (Oral)    Resp 10    SpO2 100%  Physical Exam Vitals and nursing note reviewed.  Constitutional:      Appearance: He is well-developed.  HENT:     Head: Normocephalic and atraumatic.  Eyes:     General: No scleral icterus.    Pupils: Pupils are equal, round, and reactive to light.  Cardiovascular:     Heart sounds:  Normal heart sounds.  Pulmonary:     Effort: Pulmonary effort is normal.     Breath sounds: Normal breath sounds. No wheezing.  Chest:     Chest wall: No tenderness.  Abdominal:     General: Bowel sounds are normal. There is no distension.     Palpations: Abdomen is soft.     Tenderness: There is no abdominal tenderness.  Musculoskeletal:        General: No tenderness or deformity.     Cervical back: Normal range of motion.  Skin:    General: Skin is warm and dry.  Neurological:     Mental Status: He is alert and oriented to person, place, and time.    ED Results / Procedures / Treatments   Labs (all labs ordered are listed, but only abnormal results are displayed) Labs Reviewed - No data to display  EKG None  Radiology No results found.  Procedures Procedures    Medications Ordered in ED Medications  acetaminophen (TYLENOL) tablet 650 mg (has no administration in time range)    ED Course/ Medical Decision Making/ A&P                           Medical Decision Making Risk OTC drugs.  Patient evaluated in the ED twice today, was requesting a prescription for blood pressure medication which he did not have a prior history of.  On today's second visit he is requesting medication for his arthritis, I do not see a prior diagnoses of these.  Patient's exam is unremarkable, he is resting but complaining of the smell in his feet.  He is also very tangential.  He would like a bus pass and a blanket.  Patient for malingering. Vitals are within normal limits, given tylenol for pain control.  Portions of this note were generated with Scientist, clinical (histocompatibility and immunogenetics). Dictation errors may occur despite best attempts at proofreading.   Final Clinical Impression(s) / ED Diagnoses Final diagnoses:  Pain    Rx / DC Orders ED Discharge Orders     None         Claude Manges, PA-C 02/19/22 1032    Gwyneth Sprout, MD 02/22/22 [phone]

## 2022-02-19 NOTE — ED Triage Notes (Signed)
Patient requesting blood pressure check.

## 2022-02-19 NOTE — Discharge Instructions (Signed)
I prescribed you clotrimazole for your feet.  Please apply this to the irritation on your feet as prescribed.  Please continue to monitor your blood pressure.  Please follow-up with the primary care provider that you were referred to at a previous visit.  You develop any new or worsening symptoms please come back to the emergency department.

## 2022-02-19 NOTE — ED Provider Notes (Signed)
°  [facility] EMERGENCY DEPARTMENT Provider Note   CSN: [id number] Arrival date & time: 02/19/22  0308     History  No chief complaint on file.   Nathan Norton is a 41 y.o. male who is here for med refill. He states that he ran out of his BP medicine.  He has no other complaints at this time.  HPI     Home Medications Prior to Admission medications   Medication Sig Start Date End Date Taking? Authorizing Provider  acetaminophen (TYLENOL) 500 MG tablet Take 1 tablet (500 mg total) by mouth every 6 (six) hours as needed. 12/25/21   Fayrene Helper, PA-C  cetirizine (ZYRTEC ALLERGY) 10 MG tablet Take 1 tablet (10 mg total) by mouth daily. 07/13/21   Rushie Chestnut, PA-C  loratadine (CLARITIN) 10 MG tablet Take 1 tablet (10 mg total) by mouth daily. 02/18/22 03/20/22  Edison Simon, MD      Allergies    Shellfish allergy    Review of Systems   Review of Systems  Physical Exam Updated Vital Signs BP (!) 146/96    Pulse 77    Temp 99 F (37.2 C)    Resp 18    SpO2 98%  Physical Exam Vitals and nursing note reviewed.  Constitutional:      General: He is not in acute distress.    Appearance: He is well-developed. He is not diaphoretic.  HENT:     Head: Normocephalic and atraumatic.  Eyes:     General: No scleral icterus.    Conjunctiva/sclera: Conjunctivae normal.  Cardiovascular:     Rate and Rhythm: Normal rate and regular rhythm.     Heart sounds: Normal heart sounds.  Pulmonary:     Effort: Pulmonary effort is normal. No respiratory distress.     Breath sounds: Normal breath sounds.  Abdominal:     Palpations: Abdomen is soft.     Tenderness: There is no abdominal tenderness.  Musculoskeletal:     Cervical back: Normal range of motion and neck supple.  Skin:    General: Skin is warm and dry.  Neurological:     Mental Status: He is alert.  Psychiatric:        Behavior: Behavior normal.    ED Results / Procedures / Treatments   Labs (all  labs ordered are listed, but only abnormal results are displayed) Labs Reviewed - No data to display  EKG None  Radiology No results found.  Procedures Procedures    Medications Ordered in ED Medications - No data to display  ED Course/ Medical Decision Making/ A&P                           Medical Decision Making  I have reviewed the patient's historical medication list.  I do not see that he is ever been prescribed a blood pressure medication.  Will refer to primary care.  Appears appropriate for discharge at this time  Final Clinical Impression(s) / ED Diagnoses Final diagnoses:  None    Rx / DC Orders ED Discharge Orders     None         Arthor Captain, PA-C 02/19/22 0932    Gilda Crease, MD 02/20/22 [phone]

## 2022-02-19 NOTE — ED Triage Notes (Signed)
Says he is out of HTN medication. Unsure what medication he takes.

## 2022-02-19 NOTE — ED Provider Notes (Signed)
[facility] EMERGENCY DEPARTMENT Provider Note   CSN: [id number] Arrival date & time: 02/19/22  2219     History  Chief Complaint  Patient presents with   Blood Pressure check    Nathan Norton is a 41 y.o. male.  HPI Patient is a 41 year old male who presents to the emergency department for a blood pressure check.  States that he was concerned that his blood pressure could be high so he came to the emergency department for evaluation.  Denies any symptoms such as chest pain, shortness of breath, headaches, numbness, weakness.  Also notes irritation to his feet and states that he would like a new prescription for clotrimazole.    Home Medications Prior to Admission medications   Medication Sig Start Date End Date Taking? Authorizing Provider  clotrimazole (LOTRIMIN) 1 % cream Apply to affected area 2 times daily 02/19/22  Yes Rayna Sexton, PA-C  acetaminophen (TYLENOL) 500 MG tablet Take 1 tablet (500 mg total) by mouth every 6 (six) hours as needed. 12/25/21   Domenic Moras, PA-C  cetirizine (ZYRTEC ALLERGY) 10 MG tablet Take 1 tablet (10 mg total) by mouth daily. 07/13/21   Hughie Closs, PA-C  loratadine (CLARITIN) 10 MG tablet Take 1 tablet (10 mg total) by mouth daily. 02/18/22 03/20/22  Jacelyn Pi, MD      Allergies    Shellfish allergy    Review of Systems   Review of Systems  Respiratory:  Negative for shortness of breath.   Cardiovascular:  Negative for chest pain.  Skin:  Positive for rash.  Neurological:  Negative for weakness and numbness.   Physical Exam Updated Vital Signs BP 134/77 (BP Location: Right Arm)    Pulse (!) 103    Temp 97.7 F (36.5 C) (Oral)    Resp 16    SpO2 96%  Physical Exam Vitals and nursing note reviewed.  Constitutional:      General: He is not in acute distress.    Appearance: Normal appearance. He is not ill-appearing, toxic-appearing or diaphoretic.  HENT:     Head: Normocephalic and atraumatic.      Right Ear: External ear normal.     Left Ear: External ear normal.     Nose: Nose normal.     Mouth/Throat:     Mouth: Mucous membranes are moist.     Pharynx: Oropharynx is clear. No oropharyngeal exudate or posterior oropharyngeal erythema.  Eyes:     Extraocular Movements: Extraocular movements intact.  Cardiovascular:     Rate and Rhythm: Normal rate and regular rhythm.     Pulses: Normal pulses.     Heart sounds: Normal heart sounds. No murmur heard.   No friction rub. No gallop.  Pulmonary:     Effort: Pulmonary effort is normal. No respiratory distress.     Breath sounds: Normal breath sounds. No stridor. No wheezing, rhonchi or rales.  Abdominal:     General: Abdomen is flat.     Palpations: Abdomen is soft.     Tenderness: There is no abdominal tenderness.  Musculoskeletal:        General: Normal range of motion.     Cervical back: Normal range of motion and neck supple. No tenderness.  Skin:    General: Skin is warm and dry.  Neurological:     General: No focal deficit present.     Mental Status: He is alert and oriented to person, place, and time.  Psychiatric:  Mood and Affect: Mood normal.        Behavior: Behavior normal.    ED Results / Procedures / Treatments   Labs (all labs ordered are listed, but only abnormal results are displayed) Labs Reviewed - No data to display  EKG None  Radiology No results found.  Procedures Procedures   Medications Ordered in ED Medications - No data to display  ED Course/ Medical Decision Making/ A&P                           Medical Decision Making Patient is a 41 year old male who presents to the emergency department for a blood pressure check.  States that he was concerned that his blood pressure could be high so he came to the emergency department for further evaluation.  Denies any symptoms such as chest pain, shortness of breath, numbness, weakness, headaches.  Patient's current blood pressure is  134/77.  It appears that he also came to the ED earlier today for medication refill for hypertension.  Previous provider did not see an antihypertensive listed and he was given a primary care referral.  Discussed this further with the patient.  Recommended that he follow-up with primary care as needed.  He verbalized understanding.  He also requested a prescription for clotrimazole for irritation to his feet.  This was sent.  Feel the patient is stable for discharge at this time and he is agreeable.  His questions were answered and he was amicable at the time of discharge. Final Clinical Impression(s) / ED Diagnoses Final diagnoses:  Blood pressure check   Rx / DC Orders ED Discharge Orders          Ordered    clotrimazole (LOTRIMIN) 1 % cream        02/19/22 2232              Rayna Sexton, PA-C 02/19/22 2318    Godfrey Pick, MD 02/20/22 1601

## 2022-02-19 NOTE — Discharge Instructions (Addendum)
Follow-up with your physician in order to continue treatment of your arthritis.  If you experience any worsening symptoms please return to the ED.

## 2022-02-19 NOTE — ED Triage Notes (Signed)
Pt. Stated, I got arthritis for I dont know how long. I need some tylenol.

## 2022-02-20 ENCOUNTER — Encounter (HOSPITAL_COMMUNITY): Payer: Self-pay | Admitting: Emergency Medicine

## 2022-02-20 ENCOUNTER — Emergency Department (HOSPITAL_COMMUNITY)
Admission: EM | Admit: 2022-02-20 | Discharge: 2022-02-20 | Disposition: A | Discharge status: Home / Self Care | Payer: Medicare (Managed Care)
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
## 2022-02-21 ENCOUNTER — Other Ambulatory Visit: Payer: Self-pay

## 2022-02-21 ENCOUNTER — Encounter (HOSPITAL_COMMUNITY): Payer: Self-pay | Admitting: Emergency Medicine

## 2022-02-21 ENCOUNTER — Emergency Department (HOSPITAL_COMMUNITY)
Admission: EM | Admit: 2022-02-21 | Discharge: 2022-02-21 | Disposition: A | Discharge status: Home / Self Care | Payer: Medicare (Managed Care) | Attending: Emergency Medicine | Admitting: Emergency Medicine

## 2022-02-21 DIAGNOSIS — Z765 Malingerer [conscious simulation]: Secondary | ICD-10-CM | POA: Insufficient documentation

## 2022-02-21 DIAGNOSIS — Z79899 Other long term (current) drug therapy: Secondary | ICD-10-CM | POA: Diagnosis not present

## 2022-02-21 NOTE — Discharge Instructions (Addendum)
The emergency department is for use of emergencies only.  You need to take some responsibility and follow-up with the primary care doctor.  Please do not abuse our services we need our beds for sick patients

## 2022-02-21 NOTE — ED Provider Notes (Signed)
[facility] EMERGENCY DEPARTMENT Provider Note   CSN: [id number] Arrival date & time: 02/21/22  0012     History  Chief Complaint  Patient presents with   Temperature /BP check    Nathan Norton is a 41 y.o. male who has had 37 visits in the last 6 months to the emergency department I personally seen this patient nearly every night for the last 6 shifts.  Triage indicates that the patient needed "a refill on his blood pressure medication."  I personally saw this patient for the exact same complaint 2 nights ago and addressed with him that a he has never had a prescription for blood pressure medication and be if he needed 1 he would need to see a primary care doctor and I gave [facility].  When I asked him why he was here tonight he could not think of an answer he stated "I need medicine."  I asked which medicine he needed.  He stated "it should be in my chart."  I told him that I did not think so.  He said Tylenol.  I informed the patient that he could buy this at the dollar store. I also informed the patient that he should follow-up with a primary care doctor.  He told me that I should be the one making the appointment I told him that would be impossible at 1:42 in the morning and that he should use a telephone during the daytime.    HPI     Home Medications Prior to Admission medications   Medication Sig Start Date End Date Taking? Authorizing Provider  acetaminophen (TYLENOL) 500 MG tablet Take 1 tablet (500 mg total) by mouth every 6 (six) hours as needed. 12/25/21   Domenic Moras, PA-C  cetirizine (ZYRTEC ALLERGY) 10 MG tablet Take 1 tablet (10 mg total) by mouth daily. 07/13/21   Hughie Closs, PA-C  clotrimazole (LOTRIMIN) 1 % cream Apply to affected area 2 times daily 02/19/22   Rayna Sexton, PA-C  loratadine (CLARITIN) 10 MG tablet Take 1 tablet (10 mg total) by mouth daily. 02/18/22 03/20/22  Jacelyn Pi,  MD      Allergies    Shellfish allergy    Review of Systems   Review of Systems  Physical Exam Updated Vital Signs BP (!) 144/92 (BP Location: Right Arm)    Pulse 74    Temp 98.2 F (36.8 C) (Oral)    Resp 18    SpO2 100%  Physical Exam Vitals and nursing note reviewed.  Constitutional:      General: He is not in acute distress.    Appearance: He is well-developed. He is not diaphoretic.     Comments: Patient is trying to sleep with his head under the blankets during examination  HENT:     Head: Normocephalic and atraumatic.  Eyes:     General: No scleral icterus.    Conjunctiva/sclera: Conjunctivae normal.  Cardiovascular:     Rate and Rhythm: Normal rate and regular rhythm.     Heart sounds: Normal heart sounds.  Pulmonary:     Effort: Pulmonary effort is normal. No respiratory distress.     Breath sounds: Normal breath sounds.  Abdominal:     Palpations: Abdomen is soft.     Tenderness: There is no abdominal tenderness.  Musculoskeletal:     Cervical back: Normal range of motion and neck supple.  Skin:    General: Skin is  warm and dry.  Neurological:     Mental Status: He is alert.  Psychiatric:        Behavior: Behavior normal.    ED Results / Procedures / Treatments   Labs (all labs ordered are listed, but only abnormal results are displayed) Labs Reviewed - No data to display  EKG None  Radiology No results found.  Procedures Procedures    Medications Ordered in ED Medications - No data to display  ED Course/ Medical Decision Making/ A&P                           Medical Decision Making Patient does not even seem to know why he is here in the emergency department or what he wants.  Given the amount and frequency of times I have seen him here personally and recorded visits I have voided through I believe this is all malingering.  Patient is advised to use emergency services only for emergencies.  He is given resources and again referral to  outpatient primary care.    Final Clinical Impression(s) / ED Diagnoses Final diagnoses:  Malingering    Rx / DC Orders ED Discharge Orders     None         Margarita Mail, PA-C 02/21/22 [place], Le[place], DO 02/21/22 [phone]

## 2022-02-21 NOTE — ED Triage Notes (Signed)
Patient requesting temperature/BP check.

## 2022-02-22 ENCOUNTER — Emergency Department (HOSPITAL_COMMUNITY)
Admission: EM | Admit: 2022-02-22 | Discharge: 2022-02-22 | Disposition: A | Discharge status: Home / Self Care | Payer: Medicare (Managed Care) | Attending: Emergency Medicine | Admitting: Emergency Medicine

## 2022-02-22 DIAGNOSIS — R7309 Other abnormal glucose: Secondary | ICD-10-CM | POA: Diagnosis not present

## 2022-02-22 DIAGNOSIS — R4789 Other speech disturbances: Secondary | ICD-10-CM | POA: Diagnosis not present

## 2022-02-22 DIAGNOSIS — R5383 Other fatigue: Secondary | ICD-10-CM | POA: Diagnosis not present

## 2022-02-22 DIAGNOSIS — Z5900 Homelessness unspecified: Secondary | ICD-10-CM | POA: Diagnosis not present

## 2022-02-22 LAB — CBG MONITORING, ED: Glucose-Capillary: 81 mg/dL (ref 70–99)

## 2022-02-22 NOTE — ED Notes (Signed)
Pt brought in with complaints of being hungry. Pt aox4

## 2022-02-22 NOTE — ED Provider Notes (Signed)
[facility] EMERGENCY DEPARTMENT Provider Note   CSN: [id number] Arrival date & time: 02/22/22  2112     History  No chief complaint on file.   Nathan Norton is a 41 y.o. male.  41 year old male who has multiple ED visits presents complaining of being hungry.  Patient seen here recently for same.  He is not very operative with my questions.  He denies any somatic complaints at this time      Home Medications Prior to Admission medications   Medication Sig Start Date End Date Taking? Authorizing Provider  acetaminophen (TYLENOL) 500 MG tablet Take 1 tablet (500 mg total) by mouth every 6 (six) hours as needed. 12/25/21   Fayrene Helper, PA-C  cetirizine (ZYRTEC ALLERGY) 10 MG tablet Take 1 tablet (10 mg total) by mouth daily. 07/13/21   Rushie Chestnut, PA-C  clotrimazole (LOTRIMIN) 1 % cream Apply to affected area 2 times daily 02/19/22   Placido Sou, PA-C  loratadine (CLARITIN) 10 MG tablet Take 1 tablet (10 mg total) by mouth daily. 02/18/22 03/20/22  Edison Simon, MD      Allergies    Shellfish allergy    Review of Systems   Review of Systems  Unable to perform ROS: Other   Physical Exam Updated Vital Signs BP 99/65 (BP Location: Left Arm)    Pulse 70    Temp 97.6 F (36.4 C) (Oral)    Resp 14    SpO2 98%  Physical Exam Vitals and nursing note reviewed.  Constitutional:      General: He is not in acute distress.    Appearance: Normal appearance. He is well-developed. He is not toxic-appearing.  HENT:     Head: Normocephalic and atraumatic.  Eyes:     General: Lids are normal.     Conjunctiva/sclera: Conjunctivae normal.     Pupils: Pupils are equal, round, and reactive to light.  Neck:     Thyroid: No thyroid mass.     Trachea: No tracheal deviation.  Cardiovascular:     Rate and Rhythm: Normal rate and regular rhythm.     Heart sounds: Normal heart sounds. No murmur heard.   No gallop.  Pulmonary:     Effort: Pulmonary effort is  normal. No respiratory distress.     Breath sounds: Normal breath sounds. No stridor. No decreased breath sounds, wheezing, rhonchi or rales.  Abdominal:     General: There is no distension.     Palpations: Abdomen is soft.     Tenderness: There is no abdominal tenderness. There is no rebound.  Musculoskeletal:        General: No tenderness. Normal range of motion.     Cervical back: Normal range of motion and neck supple.  Skin:    General: Skin is warm and dry.     Findings: No abrasion or rash.  Neurological:     General: No focal deficit present.     Mental Status: He is oriented to person, place, and time. Mental status is at baseline. He is lethargic.     GCS: GCS eye subscore is 4. GCS verbal subscore is 5. GCS motor subscore is 6.     Cranial Nerves: No cranial nerve deficit.     Sensory: No sensory deficit.     Motor: Motor function is intact.  Psychiatric:        Attention and Perception: He is inattentive.        Mood and Affect: Affect is blunt.  Speech: Speech is delayed.    ED Results / Procedures / Treatments   Labs (all labs ordered are listed, but only abnormal results are displayed) Labs Reviewed  CBG MONITORING, ED    EKG None  Radiology No results found.  Procedures Procedures    Medications Ordered in ED Medications - No data to display  ED Course/ Medical Decision Making/ A&P                           Medical Decision Making  We will check blood sugar here at this time.  Do not feel that he has any neurologic emergency.  Patient appears to have social issues with homelessness.  Will provide resources.  We will also provide meal here to eat.  Will discharge        Final Clinical Impression(s) / ED Diagnoses Final diagnoses:  None    Rx / DC Orders ED Discharge Orders     None         Lorre Nick, MD 02/22/22 2143

## 2022-02-23 ENCOUNTER — Other Ambulatory Visit: Payer: Self-pay

## 2022-02-23 ENCOUNTER — Emergency Department (HOSPITAL_COMMUNITY)
Admission: EM | Admit: 2022-02-23 | Discharge: 2022-02-23 | Disposition: A | Discharge status: Home / Self Care | Payer: Medicare (Managed Care) | Source: Home / Self Care | Attending: Emergency Medicine | Admitting: Emergency Medicine

## 2022-02-23 ENCOUNTER — Encounter (HOSPITAL_COMMUNITY): Payer: Self-pay | Admitting: *Deleted

## 2022-02-23 ENCOUNTER — Emergency Department (HOSPITAL_COMMUNITY)
Admission: EM | Admit: 2022-02-23 | Discharge: 2022-02-24 | Disposition: A | Discharge status: Home / Self Care | Payer: Medicare (Managed Care) | Source: Home / Self Care | Attending: Emergency Medicine | Admitting: Emergency Medicine

## 2022-02-23 ENCOUNTER — Encounter (HOSPITAL_COMMUNITY): Payer: Self-pay | Admitting: Emergency Medicine

## 2022-02-23 ENCOUNTER — Emergency Department (HOSPITAL_COMMUNITY)
Admission: EM | Admit: 2022-02-23 | Discharge: 2022-02-23 | Disposition: A | Discharge status: Home / Self Care | Payer: Medicare (Managed Care) | Attending: Emergency Medicine | Admitting: Emergency Medicine

## 2022-02-23 DIAGNOSIS — G8929 Other chronic pain: Secondary | ICD-10-CM | POA: Insufficient documentation

## 2022-02-23 DIAGNOSIS — H5789 Other specified disorders of eye and adnexa: Secondary | ICD-10-CM

## 2022-02-23 DIAGNOSIS — R21 Rash and other nonspecific skin eruption: Secondary | ICD-10-CM | POA: Diagnosis not present

## 2022-02-23 DIAGNOSIS — M25521 Pain in right elbow: Secondary | ICD-10-CM | POA: Insufficient documentation

## 2022-02-23 DIAGNOSIS — Z5900 Homelessness unspecified: Secondary | ICD-10-CM | POA: Diagnosis not present

## 2022-02-23 DIAGNOSIS — E119 Type 2 diabetes mellitus without complications: Secondary | ICD-10-CM | POA: Diagnosis not present

## 2022-02-23 MED ORDER — TRIAMCINOLONE ACETONIDE 0.1 % EX CREA
TOPICAL_CREAM | Freq: Two times a day (BID) | CUTANEOUS | Status: DC
  Filled 2022-02-23 (×2): qty 15

## 2022-02-23 MED ORDER — ACETAMINOPHEN 325 MG PO TABS
650.0000 mg | ORAL_TABLET | Freq: Once | ORAL | Status: AC
Start: 2022-02-23 — End: 2022-02-23
  Administered 2022-02-23: 650 mg via ORAL
  Filled 2022-02-23: qty 2

## 2022-02-23 MED ORDER — TRIAMCINOLONE ACETONIDE 0.1 % EX CREA: TOPICAL_CREAM | Freq: Two times a day (BID) | CUTANEOUS | Status: DC

## 2022-02-23 MED ORDER — LORATADINE 10 MG PO TABS
10.0000 mg | ORAL_TABLET | Freq: Once | ORAL | Status: AC
  Administered 2022-02-23: 10 mg via ORAL
  Filled 2022-02-23: qty 1

## 2022-02-23 NOTE — ED Notes (Signed)
ED Provider at bedside. 

## 2022-02-23 NOTE — ED Triage Notes (Signed)
Pt c/o eye pain, rigged eye patch in place

## 2022-02-23 NOTE — ED Provider Notes (Signed)
[name]   CSN: [id number] Arrival date & time: 02/23/22  1829     History  Chief Complaint  Patient presents with   Request Tylenol    Nathan Norton is a 41 y.o. male.  41 year old male presents today for dose of Tylenol and his antihistamine medication.  Denies active complaints.  In triage she mentioned pain in his "tenders".  When questioned about this he states he gets this tendinitis and he points to his elbow.  He states his elbow pain is intermittent and he wants to get ahead of it.  Denies active pain.  Without fever, chills, joint pain.  The history is provided by the patient. No language interpreter was used.      Home Medications Prior to Admission medications   Medication Sig Start Date End Date Taking? Authorizing Provider  acetaminophen (TYLENOL) 500 MG tablet Take 1 tablet (500 mg total) by mouth every 6 (six) hours as needed. 12/25/21   Fayrene Helper, PA-C  cetirizine (ZYRTEC ALLERGY) 10 MG tablet Take 1 tablet (10 mg total) by mouth daily. 07/13/21   Rushie Chestnut, PA-C  clotrimazole (LOTRIMIN) 1 % cream Apply to affected area 2 times daily 02/19/22   Placido Sou, PA-C  loratadine (CLARITIN) 10 MG tablet Take 1 tablet (10 mg total) by mouth daily. 02/18/22 03/20/22  Edison Simon, MD      Allergies    Shellfish allergy    Review of Systems   Review of Systems  Constitutional:  Negative for fever.  Musculoskeletal:  Negative for arthralgias.   Physical Exam Updated Vital Signs BP (!) 148/98    Pulse 99    Temp 99 F (37.2 C) (Oral)    Resp 18    SpO2 98%  Physical Exam Vitals and nursing note reviewed.  Constitutional:      General: He is not in acute distress.    Appearance: Normal appearance. He is not ill-appearing.  HENT:     Head: Normocephalic and atraumatic.     Nose: Nose normal.  Eyes:     Conjunctiva/sclera: Conjunctivae normal.  Pulmonary:     Effort: Pulmonary effort is  normal. No respiratory distress.  Musculoskeletal:        General: No swelling, tenderness, deformity or signs of injury. Normal range of motion.  Skin:    Findings: No rash.  Neurological:     Mental Status: He is alert.    ED Results / Procedures / Treatments   Labs (all labs ordered are listed, but only abnormal results are displayed) Labs Reviewed - No data to display  EKG None  Radiology No results found.  Procedures Procedures    Medications Ordered in ED Medications  acetaminophen (TYLENOL) tablet 650 mg (has no administration in time range)  loratadine (CLARITIN) tablet 10 mg (has no administration in time range)    ED Course/ Medical Decision Making/ A&P                           Medical Decision Making Risk OTC drugs.   41 year old male presents today for dose of Tylenol and Zyrtec.  Denies active complaints.  States he wants to stay ahead of his flareup of tendinitis.  Dose of Tylenol and Claritin given.  Patient is appropriate for discharge.  Discharged in stable condition.  Final Clinical Impression(s) / ED Diagnoses Final diagnoses:  Chronic pain of right elbow    Rx /  DC Orders ED Discharge Orders     None         Marita Kansas, Cordelia Poche 02/23/22 2024    Milagros Loll, MD 02/24/22 [phone]

## 2022-02-23 NOTE — ED Triage Notes (Signed)
Pt states, "I need Tylenol."  Asked pt where he is hurting and he said he needs it for his "tenders".

## 2022-02-23 NOTE — Discharge Instructions (Signed)
Use cream to rash twice a day.

## 2022-02-23 NOTE — ED Triage Notes (Signed)
Pt c/o neck pain for a couple days. No injury, no neuro deficits

## 2022-02-23 NOTE — ED Provider Notes (Signed)
[facility] EMERGENCY DEPARTMENT Provider Note   CSN: [id number] Arrival date & time: 02/23/22  8938     History  Chief Complaint  Patient presents with   Neck Pain    Nathan Norton is a 41 y.o. male.  The history is provided by the patient and medical records.  Neck Pain  41 y.o. M with hx of schizoaffective disorder, DM, homelessness, hx of TBI, presenting to the ED for rash to left side of his neck.  States it itches.  He denies new soap/detergents.  Only allergy is selfish.  No intervention tried PTA.  Home Medications Prior to Admission medications   Medication Sig Start Date End Date Taking? Authorizing Provider  acetaminophen (TYLENOL) 500 MG tablet Take 1 tablet (500 mg total) by mouth every 6 (six) hours as needed. 12/25/21   Fayrene Helper, PA-C  cetirizine (ZYRTEC ALLERGY) 10 MG tablet Take 1 tablet (10 mg total) by mouth daily. 07/13/21   Rushie Chestnut, PA-C  clotrimazole (LOTRIMIN) 1 % cream Apply to affected area 2 times daily 02/19/22   Placido Sou, PA-C  loratadine (CLARITIN) 10 MG tablet Take 1 tablet (10 mg total) by mouth daily. 02/18/22 03/20/22  Edison Simon, MD      Allergies    Shellfish allergy    Review of Systems   Review of Systems  Skin:  Positive for rash.  All other systems reviewed and are negative.  Physical Exam Updated Vital Signs BP 130/81    Pulse 84    Temp 97.8 F (36.6 C) (Oral)    Resp 16    SpO2 97%   Physical Exam Vitals and nursing note reviewed.  Constitutional:      Appearance: He is well-developed.  HENT:     Head: Normocephalic and atraumatic.  Eyes:     Conjunctiva/sclera: Conjunctivae normal.     Pupils: Pupils are equal, round, and reactive to light.  Neck:     Comments: Dry, eczematous appearing rash to left side of neck, signs of excoriation but no superimposed infection or cellulitis Cardiovascular:     Rate and Rhythm: Normal rate and regular rhythm.     Heart sounds: Normal heart  sounds.  Pulmonary:     Effort: Pulmonary effort is normal.     Breath sounds: Normal breath sounds.  Abdominal:     General: Bowel sounds are normal.     Palpations: Abdomen is soft.  Musculoskeletal:        General: Normal range of motion.     Cervical back: Normal range of motion.  Skin:    General: Skin is warm and dry.  Neurological:     Mental Status: He is alert and oriented to person, place, and time.    ED Results / Procedures / Treatments   Labs (all labs ordered are listed, but only abnormal results are displayed) Labs Reviewed - No data to display  EKG None  Radiology No results found.  Procedures Procedures    Medications Ordered in ED Medications  triamcinolone cream (KENALOG) 0.1 % cream (has no administration in time range)    ED Course/ Medical Decision Making/ A&P                           Medical Decision Making Risk Prescription drug management.   41 year old male presenting to the ED with rash to left side of his neck.  States it itches.  No new soaps or  detergents.  Only known allergy is shellfish and denies any recent ingestion of such.  Rashes somewhat dry and eczematous on exam.  Some signs of excoriation but no superimposed infection or cellulitis.  Will start trial of triamcinolone cream.  Can follow-up outpatient.  Return here for new concerns.  Final Clinical Impression(s) / ED Diagnoses Final diagnoses:  Rash    Rx / DC Orders ED Discharge Orders     None         [name], PA-C 02/23/22 7062    Zadie Rhine, MD 02/23/22 [phone]

## 2022-02-23 NOTE — Discharge Instructions (Addendum)
You received a dose of Tylenol and Claritin in the emergency room.  If you have any new complaints please return to the emergency room otherwise follow-up with your primary care provider

## 2022-02-24 ENCOUNTER — Other Ambulatory Visit: Payer: Self-pay

## 2022-02-24 ENCOUNTER — Emergency Department (HOSPITAL_COMMUNITY)
Admission: EM | Admit: 2022-02-24 | Discharge: 2022-02-24 | Disposition: A | Discharge status: Home / Self Care | Payer: Medicare (Managed Care) | Attending: Emergency Medicine | Admitting: Emergency Medicine

## 2022-02-24 ENCOUNTER — Encounter (HOSPITAL_COMMUNITY): Payer: Self-pay | Admitting: Emergency Medicine

## 2022-02-24 DIAGNOSIS — Z765 Malingerer [conscious simulation]: Secondary | ICD-10-CM

## 2022-02-24 NOTE — ED Provider Notes (Signed)
°  [facility] EMERGENCY DEPARTMENT Provider Note   CSN: [id number] Arrival date & time: 02/24/22  0341     History  Chief Complaint  Patient presents with   eye drops    Nathan Norton is a 41 y.o. male.  On my interview, patient is not sure why he is here. Initially says he needs to make aphone call. Subsequently says he needs eye drops. Then says that he has stickers on (I assume from the ECG machine). When I ask him why he came back for 5th visit in 2 days he states he forgot he was here because he is tired.        Home Medications Prior to Admission medications   Medication Sig Start Date End Date Taking? Authorizing Provider  acetaminophen (TYLENOL) 500 MG tablet Take 1 tablet (500 mg total) by mouth every 6 (six) hours as needed. 12/25/21   Fayrene Helper, PA-C  cetirizine (ZYRTEC ALLERGY) 10 MG tablet Take 1 tablet (10 mg total) by mouth daily. 07/13/21   Rushie Chestnut, PA-C  clotrimazole (LOTRIMIN) 1 % cream Apply to affected area 2 times daily 02/19/22   Placido Sou, PA-C  loratadine (CLARITIN) 10 MG tablet Take 1 tablet (10 mg total) by mouth daily. 02/18/22 03/20/22  Edison Simon, MD      Allergies    Shellfish allergy    Review of Systems   Review of Systems  Physical Exam Updated Vital Signs BP 121/72    Pulse 91    Temp 98.3 F (36.8 C) (Oral)    Resp 18    SpO2 97%  Physical Exam Vitals and nursing note reviewed.  Constitutional:      Appearance: He is well-developed.  HENT:     Head: Normocephalic and atraumatic.  Eyes:     Extraocular Movements: Extraocular movements intact.     Pupils: Pupils are equal, round, and reactive to light.     Comments: Mild strabismus  Cardiovascular:     Rate and Rhythm: Normal rate.  Pulmonary:     Effort: Pulmonary effort is normal. No respiratory distress.  Abdominal:     General: There is no distension.  Musculoskeletal:        General: Normal range of motion.     Cervical back: Normal  range of motion.  Skin:    General: Skin is warm and dry.  Neurological:     General: No focal deficit present.     Mental Status: He is alert.    ED Results / Procedures / Treatments   Labs (all labs ordered are listed, but only abnormal results are displayed) Labs Reviewed - No data to display  EKG None  Radiology No results found.  Procedures Procedures    Medications Ordered in ED Medications - No data to display  ED Course/ Medical Decision Making/ A&P                           Medical Decision Making  Patient just had a complete eye exam a couple hours ago and doesn't even have an eye complaint at this time. Seems to be malingering.    Final Clinical Impression(s) / ED Diagnoses Final diagnoses:  Malingering    Rx / DC Orders ED Discharge Orders     None         Avyn Coate, Barbara Cower, MD 02/24/22 [phone]

## 2022-02-24 NOTE — ED Provider Notes (Signed)
[facility] EMERGENCY DEPARTMENT Provider Note   CSN: [id number] Arrival date & time: 02/23/22  2227     History  Chief Complaint  Patient presents with   Eye Pain    Nathan Norton is a 41 y.o. male.  HPI Patient is a 41 year old male who presents to the emergency department due to eye irritation.  Patient states his symptoms started earlier today.  After further conversation patient admits that he is here simply for food.  Requests a sandwich as well as apple juice.  Denies any eye complaints or other complaints to me at this time.    Home Medications Prior to Admission medications   Medication Sig Start Date End Date Taking? Authorizing Provider  acetaminophen (TYLENOL) 500 MG tablet Take 1 tablet (500 mg total) by mouth every 6 (six) hours as needed. 12/25/21   Domenic Moras, PA-C  cetirizine (ZYRTEC ALLERGY) 10 MG tablet Take 1 tablet (10 mg total) by mouth daily. 07/13/21   Hughie Closs, PA-C  clotrimazole (LOTRIMIN) 1 % cream Apply to affected area 2 times daily 02/19/22   Rayna Sexton, PA-C  loratadine (CLARITIN) 10 MG tablet Take 1 tablet (10 mg total) by mouth daily. 02/18/22 03/20/22  Jacelyn Pi, MD      Allergies    Shellfish allergy    Review of Systems   Review of Systems  Constitutional:  Negative for chills and fever.  Eyes:  Negative for photophobia, pain, discharge, redness, itching and visual disturbance.   Physical Exam Updated Vital Signs BP (!) 139/98    Pulse 99    Temp 98 F (36.7 C) (Oral)    Resp 16    SpO2 93%  Physical Exam Vitals and nursing note reviewed.  Constitutional:      General: He is not in acute distress.    Appearance: He is well-developed.  HENT:     Head: Normocephalic and atraumatic.     Right Ear: External ear normal.     Left Ear: External ear normal.  Eyes:     General: No scleral icterus.       Right eye: No discharge.        Left eye: No discharge.     Extraocular Movements: Extraocular  movements intact.     Conjunctiva/sclera: Conjunctivae normal.     Pupils: Pupils are equal, round, and reactive to light.     Comments: Pupils are equal, round, and reactive to light.  Extraocular movements are intact.  Sclera are clear.  No discharge noted from either eye.  Visual fields grossly intact.  Neck:     Trachea: No tracheal deviation.  Cardiovascular:     Rate and Rhythm: Normal rate.  Pulmonary:     Effort: Pulmonary effort is normal. No respiratory distress.     Breath sounds: No stridor.  Abdominal:     General: Abdomen is flat. There is no distension.  Musculoskeletal:        General: No swelling or deformity.     Cervical back: Neck supple.  Skin:    General: Skin is warm and dry.     Findings: No rash.  Neurological:     Mental Status: He is alert.     Cranial Nerves: Cranial nerve deficit: no gross deficits.   ED Results / Procedures / Treatments   Labs (all labs ordered are listed, but only abnormal results are displayed) Labs Reviewed - No data to display  EKG None  Radiology No results found.  Procedures Procedures   Medications Ordered in ED Medications - No data to display  ED Course/ Medical Decision Making/ A&P                           Medical Decision Making Patient is a 41 year old male who presents to the emergency department requesting food.  Initially presented for eye irritation but after further conversation denies any eye complaints.  Request a sandwich as well as something to drink.  Had nursing staff provide patient a meal.  Physical exam otherwise reassuring.  Patient afebrile and nontachycardic.  Providing clear answers to questions and following commands.  Pupils are equal, round, and reactive to light.  Extraocular movements are intact.  Sclera clear.  No discharge noted from either eye.  Visual fields are grossly intact.  Feel that the patient is stable for discharge at this time and he is agreeable.  His questions were  answered and he was amicable at the time of discharge. Final Clinical Impression(s) / ED Diagnoses Final diagnoses:  Eye irritation   Rx / DC Orders ED Discharge Orders     None         Rayna Sexton, PA-C 02/24/22 0059    Merrily Pew, MD 02/24/22 [phone]

## 2022-02-24 NOTE — Discharge Instructions (Signed)
Please continue to monitor your symptoms closely.  If you develop any new or worsening symptoms please come back to the emergency department. 

## 2022-02-24 NOTE — ED Triage Notes (Signed)
Patient coming from home, c/o eye pain requesting eye drops.

## 2022-02-24 NOTE — ED Notes (Signed)
Given d/c papers, orange juice, and sandwich. Instructed to go to lobby.

## 2022-02-27 ENCOUNTER — Other Ambulatory Visit: Payer: Self-pay

## 2022-02-27 ENCOUNTER — Encounter: Payer: Self-pay | Admitting: *Deleted

## 2022-02-27 ENCOUNTER — Emergency Department (HOSPITAL_COMMUNITY)
Admission: EM | Admit: 2022-02-27 | Discharge: 2022-02-27 | Disposition: A | Discharge status: Home / Self Care | Payer: Medicare (Managed Care) | Attending: Emergency Medicine | Admitting: Emergency Medicine

## 2022-02-27 DIAGNOSIS — E119 Type 2 diabetes mellitus without complications: Secondary | ICD-10-CM | POA: Diagnosis not present

## 2022-02-27 DIAGNOSIS — Z765 Malingerer [conscious simulation]: Secondary | ICD-10-CM | POA: Insufficient documentation

## 2022-02-27 DIAGNOSIS — Z79899 Other long term (current) drug therapy: Secondary | ICD-10-CM | POA: Diagnosis not present

## 2022-02-27 DIAGNOSIS — I10 Essential (primary) hypertension: Secondary | ICD-10-CM | POA: Diagnosis not present

## 2022-02-27 DIAGNOSIS — M25512 Pain in left shoulder: Secondary | ICD-10-CM | POA: Diagnosis present

## 2022-02-27 DIAGNOSIS — Z5900 Homelessness unspecified: Secondary | ICD-10-CM | POA: Diagnosis not present
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
## 2022-02-27 NOTE — ED Triage Notes (Signed)
Pt w L shoulder pain ?

## 2022-02-27 NOTE — ED Provider Notes (Signed)
?[facility] ?Provider Note ? ? ?CSN: [id number] ?Arrival date & time: 02/27/22  0549 ? ?  ? ?History ? ?Chief Complaint  ?Patient presents with  ? Shoulder Pain  ? ? ?Mahamud Frenz is a 41 y.o. male. ? ?Brey Peerson is a 41 y.o. male with a history of schizoaffective disorder, TBI, hypertension, diabetes, homelessness, who arrives via EMS reportedly for shoulder pain.  On my evaluation patient will not speak to me or answer any questions.  He asks what hospital he is at to which I responded [facility].  When I asked him why he was here today he reports "they were supposed to tell you" and he will not answer any more of my questions.  When I asked the patient if he wants to be evaluated today he shook his head no and would not participate in any further evaluation. ? ?The history is provided by the patient.  ? ?  ? ?Home Medications ?Prior to Admission medications   ?Medication Sig Start Date End Date Taking? Authorizing Provider  ?acetaminophen (TYLENOL) 500 MG tablet Take 1 tablet (500 mg total) by mouth every 6 (six) hours as needed. 12/25/21   Domenic Moras, PA-C  ?cetirizine (ZYRTEC ALLERGY) 10 MG tablet Take 1 tablet (10 mg total) by mouth daily. 07/13/21   Hughie Closs, PA-C  ?clotrimazole (LOTRIMIN) 1 % cream Apply to affected area 2 times daily 02/19/22   Rayna Sexton, PA-C  ?loratadine (CLARITIN) 10 MG tablet Take 1 tablet (10 mg total) by mouth daily. 02/18/22 03/20/22  Jacelyn Pi, MD  ?   ? ?Allergies    ?Shellfish allergy   ? ?Review of Systems   ?Review of Systems  ?Unable to perform ROS: Other (Patient uncooperative, unwilling to answer any further question)  ? ?Physical Exam ?Updated Vital Signs ?BP 135/89 (BP Location: Right Arm)   Pulse 86   Temp 98 ?F (36.7 ?C)   Resp 17   SpO2 99%  ?Physical Exam ?Vitals and nursing note reviewed.  ?Constitutional:   ?   General: He is not in acute distress. ?   Appearance: Normal appearance. He is  well-developed. He is not ill-appearing or diaphoretic.  ?   Comments: Alert, sitting comfortably in wheelchair, in no acute distress  ?HENT:  ?   Head: Normocephalic and atraumatic.  ?Eyes:  ?   General:     ?   Right eye: No discharge.     ?   Left eye: No discharge.  ?Cardiovascular:  ?   Rate and Rhythm: Normal rate.  ?Pulmonary:  ?   Effort: Pulmonary effort is normal. No respiratory distress.  ?Musculoskeletal:     ?   General: No deformity.  ?Skin: ?   General: Skin is warm and dry.  ?Neurological:  ?   Mental Status: He is alert and oriented to person, place, and time.  ?   Coordination: Coordination normal.  ?Psychiatric:     ?   Mood and Affect: Mood normal.     ?   Behavior: Behavior normal.  ? ? ?ED Results / Procedures / Treatments   ?Labs ?(all labs ordered are listed, but only abnormal results are displayed) ?Labs Reviewed - No data to display ? ?EKG ?None ? ?Radiology ?No results found. ? ?Procedures ?Procedures  ? ? ?Medications Ordered in ED ?Medications - No data to display ? ?ED Course/ Medical Decision Making/ A&P ?  ?                        ?  Medical Decision Making ? ?Patient arrives via EMS initially with complaint of left shoulder pain but on my evaluation patient without any complaints and will not answer any questions or participate in evaluation.  When asked by patient if he wanted to be evaluated he shook his head no.  His vitals are normal and he is alert and in no acute distress.  He has been seen in the ED numerous times for similar complaints.  He appears to be moving both of his upper extremities without difficulty. ? ?Given the patient will not answering questions or participate in any further evaluation and there is no obvious injury to the shoulder will discharge with recommended outpatient follow-up. ? ? ? ? ? ? ? ?Final Clinical Impression(s) / ED Diagnoses ?Final diagnoses:  ?Malingering  ? ? ?Rx / DC Orders ?ED Discharge Orders   ? ? None  ? ?  ? ? ?  ?Jacqlyn Larsen,  PA-C ?02/27/22 0600 ? ?  ?Palumbo, April, MD ?02/27/22 [id number] ? ?

## 2022-02-28 ENCOUNTER — Emergency Department (HOSPITAL_COMMUNITY)
Admission: EM | Admit: 2022-02-28 | Discharge: 2022-02-28 | Disposition: A | Discharge status: Home / Self Care | Payer: Medicare (Managed Care) | Attending: Emergency Medicine | Admitting: Emergency Medicine

## 2022-02-28 ENCOUNTER — Other Ambulatory Visit: Payer: Self-pay

## 2022-02-28 ENCOUNTER — Encounter (HOSPITAL_COMMUNITY): Payer: Self-pay

## 2022-02-28 DIAGNOSIS — Z5900 Homelessness unspecified: Secondary | ICD-10-CM | POA: Diagnosis not present

## 2022-02-28 DIAGNOSIS — Z79899 Other long term (current) drug therapy: Secondary | ICD-10-CM | POA: Insufficient documentation

## 2022-02-28 DIAGNOSIS — I10 Essential (primary) hypertension: Secondary | ICD-10-CM | POA: Insufficient documentation

## 2022-02-28 DIAGNOSIS — T68XXXA Hypothermia, initial encounter: Secondary | ICD-10-CM | POA: Diagnosis not present

## 2022-02-28 DIAGNOSIS — Z4802 Encounter for removal of sutures: Secondary | ICD-10-CM | POA: Insufficient documentation

## 2022-02-28 DIAGNOSIS — E119 Type 2 diabetes mellitus without complications: Secondary | ICD-10-CM | POA: Diagnosis not present

## 2022-02-28 DIAGNOSIS — X31XXXA Exposure to excessive natural cold, initial encounter: Secondary | ICD-10-CM | POA: Insufficient documentation

## 2022-02-28 NOTE — ED Notes (Signed)
5 staples removed from head by PA-C.  ?

## 2022-02-28 NOTE — ED Triage Notes (Signed)
Arrives via [facility] with c/o being cold.   ?
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
## 2022-03-01 ENCOUNTER — Emergency Department (HOSPITAL_COMMUNITY)
Admission: EM | Admit: 2022-03-01 | Discharge: 2022-03-01 | Disposition: A | Discharge status: Home / Self Care | Payer: Medicare (Managed Care) | Attending: Emergency Medicine | Admitting: Emergency Medicine

## 2022-03-01 DIAGNOSIS — I10 Essential (primary) hypertension: Secondary | ICD-10-CM | POA: Diagnosis not present

## 2022-03-01 DIAGNOSIS — M25562 Pain in left knee: Secondary | ICD-10-CM | POA: Insufficient documentation

## 2022-03-01 DIAGNOSIS — Z5900 Homelessness unspecified: Secondary | ICD-10-CM | POA: Diagnosis not present

## 2022-03-01 DIAGNOSIS — E119 Type 2 diabetes mellitus without complications: Secondary | ICD-10-CM | POA: Insufficient documentation

## 2022-03-01 MED ORDER — ACETAMINOPHEN 325 MG PO TABS: 650.0000 mg | ORAL_TABLET | Freq: Once | ORAL | Status: DC

## 2022-03-01 NOTE — Discharge Instructions (Signed)
Follow up with your primary care provider.
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
## 2022-03-01 NOTE — ED Triage Notes (Signed)
Pt presents to ED with rambling speech, unclear chief complaint. States to this RN "I just want tylenol".  ?

## 2022-03-01 NOTE — ED Notes (Signed)
Pt signed obscenities for his mse signature. When asked to sign his name he whispers "coffee cakes" and erases what he wrote. Patient then walks away.   ?

## 2022-03-01 NOTE — ED Notes (Signed)
Pt refused vital signs, repeatedly asked ths RN to "meet him at the bus depot". This RN offered pt tylenol, pt refused stating "I don't want that baby just meet me at the bus stop". Pt noted to gather all this belongings and leave triage.  ?

## 2022-03-03 ENCOUNTER — Other Ambulatory Visit: Payer: Self-pay

## 2022-03-03 ENCOUNTER — Encounter (HOSPITAL_COMMUNITY): Payer: Self-pay | Admitting: Emergency Medicine

## 2022-03-03 ENCOUNTER — Emergency Department (HOSPITAL_COMMUNITY)
Admission: EM | Admit: 2022-03-03 | Discharge: 2022-03-04 | Disposition: A | Discharge status: Home / Self Care | Payer: Medicare (Managed Care) | Attending: Emergency Medicine | Admitting: Emergency Medicine

## 2022-03-03 DIAGNOSIS — Z5900 Homelessness unspecified: Secondary | ICD-10-CM | POA: Diagnosis not present

## 2022-03-03 DIAGNOSIS — Z79899 Other long term (current) drug therapy: Secondary | ICD-10-CM | POA: Insufficient documentation

## 2022-03-03 DIAGNOSIS — R52 Pain, unspecified: Secondary | ICD-10-CM

## 2022-03-03 DIAGNOSIS — E119 Type 2 diabetes mellitus without complications: Secondary | ICD-10-CM | POA: Diagnosis not present

## 2022-03-03 DIAGNOSIS — I10 Essential (primary) hypertension: Secondary | ICD-10-CM | POA: Insufficient documentation

## 2022-03-03 DIAGNOSIS — M2550 Pain in unspecified joint: Secondary | ICD-10-CM | POA: Insufficient documentation

## 2022-03-03 DIAGNOSIS — Z760 Encounter for issue of repeat prescription: Secondary | ICD-10-CM | POA: Diagnosis present

## 2022-03-03 NOTE — ED Triage Notes (Signed)
Pt states he needs his medications.  "I'm hungry." ?

## 2022-03-03 NOTE — ED Notes (Signed)
Called 3x, no answer.  

## 2022-03-04 MED ORDER — ACETAMINOPHEN 325 MG PO TABS
650.0000 mg | ORAL_TABLET | Freq: Once | ORAL | Status: AC
  Administered 2022-03-04: 650 mg via ORAL
  Filled 2022-03-04: qty 2
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
## 2022-03-06 ENCOUNTER — Emergency Department (HOSPITAL_COMMUNITY)
Admission: EM | Admit: 2022-03-06 | Discharge: 2022-03-06 | Disposition: A | Discharge status: Home / Self Care | Payer: Medicare (Managed Care) | Attending: Emergency Medicine | Admitting: Emergency Medicine

## 2022-03-06 ENCOUNTER — Encounter (HOSPITAL_COMMUNITY): Payer: Self-pay

## 2022-03-06 ENCOUNTER — Other Ambulatory Visit: Payer: Self-pay

## 2022-03-06 DIAGNOSIS — Z79899 Other long term (current) drug therapy: Secondary | ICD-10-CM | POA: Insufficient documentation

## 2022-03-06 DIAGNOSIS — E119 Type 2 diabetes mellitus without complications: Secondary | ICD-10-CM | POA: Insufficient documentation

## 2022-03-06 DIAGNOSIS — M79672 Pain in left foot: Secondary | ICD-10-CM | POA: Insufficient documentation

## 2022-03-06 DIAGNOSIS — I10 Essential (primary) hypertension: Secondary | ICD-10-CM | POA: Diagnosis not present

## 2022-03-06 DIAGNOSIS — Z5900 Homelessness unspecified: Secondary | ICD-10-CM | POA: Insufficient documentation

## 2022-03-06 DIAGNOSIS — R000 Tachycardia, unspecified: Secondary | ICD-10-CM | POA: Insufficient documentation

## 2022-03-06 DIAGNOSIS — M79671 Pain in right foot: Secondary | ICD-10-CM | POA: Insufficient documentation

## 2022-03-06 MED ORDER — ACETAMINOPHEN 500 MG PO TABS
500.0000 mg | ORAL_TABLET | Freq: Once | ORAL | Status: AC
  Administered 2022-03-06: 500 mg via ORAL
  Filled 2022-03-06: qty 1

## 2022-03-06 NOTE — ED Provider Notes (Signed)
?[facility] EMERGENCY DEPARTMENT ?Provider Note ? ? ?CSN: [id number] ?Arrival date & time: 03/06/22  1958 ? ?  ? ?History ? ?Chief Complaint  ?Patient presents with  ? Homeless  ? ? ?Nathan Norton is a 41 y.o. male Nathan Norton is a 41 y.o. male with PMHx TBI, schizoaffective disorder, HTN, diabetes, alcohol abuse and homelessness who presents to the ED today for medication assistance.  This gentleman is well-known to this emergency department.  Pt states he is here requesting Tylenol.  He reports some pain in his feet from walking.  He denies any other complaints at this time, he often comes for Tylenol, and other small complaints to get some rest from homelessness. ? ?The history is provided by the patient and medical records.  ? ?HPI ? ?  ? ?Home Medications ?Prior to Admission medications   ?Medication Sig Start Date End Date Taking? Authorizing Provider  ?acetaminophen (TYLENOL) 500 MG tablet Take 1 tablet (500 mg total) by mouth every 6 (six) hours as needed. 12/25/21   Fayrene Helper, PA-C  ?cetirizine (ZYRTEC ALLERGY) 10 MG tablet Take 1 tablet (10 mg total) by mouth daily. 07/13/21   Rushie Chestnut, PA-C  ?clotrimazole (LOTRIMIN) 1 % cream Apply to affected area 2 times daily 02/19/22   Placido Sou, PA-C  ?loratadine (CLARITIN) 10 MG tablet Take 1 tablet (10 mg total) by mouth daily. 02/18/22 03/20/22  Edison Simon, MD  ?   ? ?Allergies    ?Shellfish allergy   ? ?Review of Systems   ?Review of Systems  ?All other systems reviewed and are negative. ? ?Physical Exam ?Updated Vital Signs ?BP (!) 150/108 (BP Location: Right Arm)   Pulse (!) 106   Temp 98.4 ?F (36.9 ?C) (Oral)   Resp 16   SpO2 99%  ?Physical Exam ?Vitals and nursing note reviewed.  ?Constitutional:   ?   General: He is not in acute distress. ?   Appearance: Normal appearance.  ?   Comments: Disheveled appearance  ?HENT:  ?   Head: Normocephalic and atraumatic.  ?Eyes:  ?   General:     ?   Right eye: No discharge.      ?   Left eye: No discharge.  ?Cardiovascular:  ?   Rate and Rhythm: Normal rate and regular rhythm.  ?Pulmonary:  ?   Effort: Pulmonary effort is normal. No respiratory distress.  ?Musculoskeletal:     ?   General: No deformity.  ?Skin: ?   General: Skin is warm and dry.  ?Neurological:  ?   Mental Status: He is alert and oriented to person, place, and time.  ?Psychiatric:     ?   Mood and Affect: Mood normal.     ?   Behavior: Behavior normal.  ? ? ?ED Results / Procedures / Treatments   ?Labs ?(all labs ordered are listed, but only abnormal results are displayed) ?Labs Reviewed - No data to display ? ?EKG ?None ? ?Radiology ?No results found. ? ?Procedures ?Procedures  ? ? ?Medications Ordered in ED ?Medications  ?acetaminophen (TYLENOL) tablet 500 mg (has no administration in time range)  ? ? ?ED Course/ Medical Decision Making/ A&P ?  ?                        ?Medical Decision Making ? ?This is an overall well-appearing male who is well-known to this emergency department.  He endorses some pain in his feet from  walking.  He denies any numbness, tingling, or other complaints at this time.  He requests 10 minutes for a nap prior to discharge. ? ?He is noted to be minimally tachycardic on arrival, but vitals were taken right after he arrived, he is with normal heart rate on my exam.  He is afebrile, denies any shortness of breath, chest pain, hemoptysis. ? ?We will provide him with some Tylenol as requested and then discharge.  Return precautions given.  Patient discharged in stable condition. ?Final Clinical Impression(s) / ED Diagnoses ?Final diagnoses:  ?Homelessness  ? ? ?Rx / DC Orders ?ED Discharge Orders   ? ? None  ? ?  ? ? ?  ?Olene Floss, PA-C ?03/06/22 2009 ? ?  ?Derwood Kaplan, MD ?03/11/22 [phone] ? ?

## 2022-03-06 NOTE — ED Triage Notes (Signed)
Pt reports he is here today to get 10 mins of sleep and tylenol ?

## 2022-03-08 ENCOUNTER — Encounter: Payer: Self-pay | Admitting: *Deleted
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
## 2022-03-09 ENCOUNTER — Other Ambulatory Visit: Payer: Self-pay

## 2022-03-09 ENCOUNTER — Encounter (HOSPITAL_COMMUNITY): Payer: Self-pay

## 2022-03-09 ENCOUNTER — Emergency Department (HOSPITAL_COMMUNITY)
Admission: EM | Admit: 2022-03-09 | Discharge: 2022-03-09 | Disposition: A | Discharge status: Home / Self Care | Payer: Medicare (Managed Care) | Attending: Emergency Medicine | Admitting: Emergency Medicine

## 2022-03-09 DIAGNOSIS — T754XXA Electrocution, initial encounter: Secondary | ICD-10-CM | POA: Insufficient documentation

## 2022-03-09 DIAGNOSIS — I10 Essential (primary) hypertension: Secondary | ICD-10-CM | POA: Insufficient documentation

## 2022-03-09 DIAGNOSIS — M791 Myalgia, unspecified site: Secondary | ICD-10-CM | POA: Insufficient documentation

## 2022-03-09 DIAGNOSIS — E119 Type 2 diabetes mellitus without complications: Secondary | ICD-10-CM | POA: Insufficient documentation

## 2022-03-09 DIAGNOSIS — W868XXA Exposure to other electric current, initial encounter: Secondary | ICD-10-CM

## 2022-03-09 DIAGNOSIS — Z79899 Other long term (current) drug therapy: Secondary | ICD-10-CM | POA: Insufficient documentation

## 2022-03-09 MED ORDER — ACETAMINOPHEN 325 MG PO TABS
650.0000 mg | ORAL_TABLET | Freq: Once | ORAL | Status: AC
  Administered 2022-03-09: 650 mg via ORAL
  Filled 2022-03-09: qty 2
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
## 2022-06-14 ENCOUNTER — Emergency Department (HOSPITAL_COMMUNITY)
Admission: EM | Admit: 2022-06-14 | Discharge: 2022-06-14 | Disposition: A | Discharge status: Home / Self Care | Payer: Medicare (Managed Care) | Attending: Emergency Medicine | Admitting: Emergency Medicine

## 2022-06-14 ENCOUNTER — Emergency Department (HOSPITAL_COMMUNITY)
Admission: EM | Admit: 2022-06-14 | Discharge: 2022-06-15 | Discharge status: Against Medical Advice | Payer: Medicare (Managed Care) | Source: Home / Self Care | Attending: Emergency Medicine | Admitting: Emergency Medicine

## 2022-06-14 ENCOUNTER — Encounter (HOSPITAL_COMMUNITY): Payer: Self-pay

## 2022-06-14 ENCOUNTER — Other Ambulatory Visit: Payer: Self-pay

## 2022-06-14 DIAGNOSIS — T7840XA Allergy, unspecified, initial encounter: Secondary | ICD-10-CM | POA: Insufficient documentation

## 2022-06-14 DIAGNOSIS — I10 Essential (primary) hypertension: Secondary | ICD-10-CM | POA: Insufficient documentation

## 2022-06-14 DIAGNOSIS — J301 Allergic rhinitis due to pollen: Secondary | ICD-10-CM | POA: Insufficient documentation

## 2022-06-14 DIAGNOSIS — Z5900 Homelessness unspecified: Secondary | ICD-10-CM | POA: Diagnosis present

## 2022-06-14 DIAGNOSIS — Z0130 Encounter for examination of blood pressure without abnormal findings: Secondary | ICD-10-CM

## 2022-06-14 DIAGNOSIS — Z5321 Procedure and treatment not carried out due to patient leaving prior to being seen by health care provider: Secondary | ICD-10-CM | POA: Insufficient documentation

## 2022-06-14 DIAGNOSIS — T7840XD Allergy, unspecified, subsequent encounter: Secondary | ICD-10-CM

## 2022-06-14 MED ORDER — LORATADINE 10 MG PO TABS
10.0000 mg | ORAL_TABLET | Freq: Once | ORAL | Status: AC
  Administered 2022-06-14: 10 mg via ORAL
  Filled 2022-06-14: qty 1

## 2022-06-14 NOTE — ED Triage Notes (Signed)
Reports wanting BP check and heart beating fast and his allergies are bothering him.  Reports cough. Patient very vague about symptoms.

## 2022-06-14 NOTE — Discharge Instructions (Addendum)
You were seen in the emergency department today for a blood pressure check and allergy medicine.  Your blood pressure was 142/73 today. We gave you one tablet of allergy medicine.  I've attached resource guide to shelters in the area.

## 2022-06-14 NOTE — ED Provider Notes (Signed)
[facility] EMERGENCY DEPARTMENT Provider Note   CSN: [id number] Arrival date & time: 06/14/22  0932     History  Chief Complaint  Patient presents with   Hypertension    Nathan Norton is a 41 y.o. male who presents to the ER for various vague complaints. Very well known to this department with 39 ER visits in the last 6 months, all for similar vague complaints.  Complaining today of needing his blood pressure checked, needing his finger pricked, needing antibiotics for his pollen allergy, and needing a place to rest.    Hypertension Pertinent negatives include no chest pain and no headaches.       Home Medications Prior to Admission medications   Medication Sig Start Date End Date Taking? Authorizing Provider  acetaminophen (TYLENOL) 500 MG tablet Take 1 tablet (500 mg total) by mouth every 6 (six) hours as needed. 12/25/21   Fayrene Helper, PA-C  cetirizine (ZYRTEC ALLERGY) 10 MG tablet Take 1 tablet (10 mg total) by mouth daily. 07/13/21   Rushie Chestnut, PA-C  clotrimazole (LOTRIMIN) 1 % cream Apply to affected area 2 times daily 02/19/22   Placido Sou, PA-C  loratadine (CLARITIN) 10 MG tablet Take 1 tablet (10 mg total) by mouth daily. 02/18/22 03/20/22  Edison Simon, MD      Allergies    Shellfish allergy    Review of Systems   Review of Systems  HENT:  Positive for congestion.   Respiratory:  Positive for cough.   Cardiovascular:  Negative for chest pain.  Neurological:  Negative for headaches.  All other systems reviewed and are negative.   Physical Exam Updated Vital Signs BP (!) 142/73 (BP Location: Right Arm)   Pulse (!) 117   Temp 98.2 F (36.8 C) (Oral)   Resp 16   SpO2 96%  Physical Exam Vitals and nursing note reviewed.  Constitutional:      Appearance: Normal appearance.  HENT:     Head: Normocephalic and atraumatic.  Eyes:     Conjunctiva/sclera: Conjunctivae normal.  Pulmonary:     Effort: Pulmonary effort is  normal. No respiratory distress.  Skin:    General: Skin is warm and dry.  Neurological:     Mental Status: He is alert.  Psychiatric:        Mood and Affect: Mood normal.        Behavior: Behavior normal.     ED Results / Procedures / Treatments   Labs (all labs ordered are listed, but only abnormal results are displayed) Labs Reviewed - No data to display  EKG None  Radiology No results found.  Procedures Procedures    Medications Ordered in ED Medications  loratadine (CLARITIN) tablet 10 mg (10 mg Oral Given 06/14/22 0941)    ED Course/ Medical Decision Making/ A&P                           Medical Decision Making Risk OTC drugs.   This patient is a 41 y.o. male who presents to the ER for various vague complaints. Concern for malingering.   Past Medical History / Social History / Additional history: Chart reviewed. Pertinent results include: 39 ER visits in the past 6 months for similar vague complaints  Physical Exam: Physical exam performed. The pertinent findings include: Blood pressure 142/73. No headache or chest pain. Asking for allergy medicine.    Disposition: After consideration of the diagnostic results and the patients  response to treatment, I feel that patient's not requiring admission. Given one dose of claritin, reassured about his BP, and given resource guide for homeless shelters.   Final Clinical Impression(s) / ED Diagnoses Final diagnoses:  Blood pressure check  Allergy, subsequent encounter  Homelessness    Rx / DC Orders ED Discharge Orders     None      Portions of this report may have been transcribed using voice recognition software. Every effort was made to ensure accuracy; however, inadvertent computerized transcription errors may be present.    Jeanella Flattery 06/14/22 0945    Mancel Bale, MD 06/15/22 [phone]

## 2022-06-15 ENCOUNTER — Other Ambulatory Visit: Payer: Self-pay

## 2022-06-15 ENCOUNTER — Encounter (HOSPITAL_COMMUNITY): Payer: Self-pay | Admitting: Emergency Medicine

## 2022-06-15 NOTE — ED Notes (Signed)
Staff witnessed pt ambulating out of the department at this time.

## 2022-06-15 NOTE — ED Notes (Signed)
Attempted to complete an assessment on pt. Started off with asking pt his name and DOB. The only answer he would provide is " I'm good at just need cold medicine". Wouldn't respond to any further questions

## 2022-06-15 NOTE — ED Triage Notes (Signed)
Pt walked out of triage room stating "Alright, I'll holler". Pt then left triage area, ambulatory with NAD noted.

## 2022-06-15 NOTE — ED Triage Notes (Addendum)
Pt reported to ED stating "I have allergies". When this RN attempted to clarify what symptoms put is experiencing he pointed to eyes but is not forthcoming with further details.  Denies other symptoms at this time.

## 2022-06-15 NOTE — ED Provider Notes (Signed)
Alanson is well known to this department. Eloped prior to my presentation to the bedside once bedded in the ED. He may return at any time.   This chart was dictated using voice recognition software, Dragon. Despite the best efforts of this provider to proofread and correct errors, errors may still occur which can change documentation meaning.    Paris Lore, PA-C 06/15/22 0554    Maia Plan, MD 06/15/22 [phone]

## 2022-06-17 DIAGNOSIS — Z5321 Procedure and treatment not carried out due to patient leaving prior to being seen by health care provider: Secondary | ICD-10-CM | POA: Insufficient documentation

## 2022-06-17 DIAGNOSIS — I10 Essential (primary) hypertension: Secondary | ICD-10-CM | POA: Diagnosis not present

## 2022-06-17 DIAGNOSIS — R0981 Nasal congestion: Secondary | ICD-10-CM | POA: Diagnosis not present

## 2022-06-17 DIAGNOSIS — Z20822 Contact with and (suspected) exposure to covid-19: Secondary | ICD-10-CM | POA: Diagnosis not present

## 2022-06-17 DIAGNOSIS — E119 Type 2 diabetes mellitus without complications: Secondary | ICD-10-CM | POA: Diagnosis not present

## 2022-06-17 DIAGNOSIS — F17200 Nicotine dependence, unspecified, uncomplicated: Secondary | ICD-10-CM | POA: Insufficient documentation

## 2022-06-17 DIAGNOSIS — F10920 Alcohol use, unspecified with intoxication, uncomplicated: Secondary | ICD-10-CM | POA: Diagnosis not present

## 2022-06-17 DIAGNOSIS — M791 Myalgia, unspecified site: Secondary | ICD-10-CM | POA: Insufficient documentation

## 2022-06-18 ENCOUNTER — Other Ambulatory Visit: Payer: Self-pay

## 2022-06-18 ENCOUNTER — Emergency Department (HOSPITAL_COMMUNITY)
Admission: EM | Admit: 2022-06-18 | Discharge: 2022-06-19 | Disposition: A | Discharge status: Home / Self Care | Payer: Medicare (Managed Care) | Source: Home / Self Care

## 2022-06-18 ENCOUNTER — Encounter (HOSPITAL_COMMUNITY): Payer: Self-pay | Admitting: Emergency Medicine

## 2022-06-18 ENCOUNTER — Emergency Department (HOSPITAL_COMMUNITY)
Admission: EM | Admit: 2022-06-18 | Discharge: 2022-06-18 | Disposition: A | Discharge status: Home / Self Care | Payer: Medicare (Managed Care) | Attending: Emergency Medicine | Admitting: Emergency Medicine

## 2022-06-18 DIAGNOSIS — E119 Type 2 diabetes mellitus without complications: Secondary | ICD-10-CM | POA: Insufficient documentation

## 2022-06-18 DIAGNOSIS — M791 Myalgia, unspecified site: Secondary | ICD-10-CM | POA: Diagnosis not present

## 2022-06-18 DIAGNOSIS — R0981 Nasal congestion: Secondary | ICD-10-CM | POA: Insufficient documentation

## 2022-06-18 DIAGNOSIS — Z20822 Contact with and (suspected) exposure to covid-19: Secondary | ICD-10-CM | POA: Insufficient documentation

## 2022-06-18 DIAGNOSIS — I10 Essential (primary) hypertension: Secondary | ICD-10-CM | POA: Insufficient documentation

## 2022-06-18 DIAGNOSIS — F17200 Nicotine dependence, unspecified, uncomplicated: Secondary | ICD-10-CM | POA: Insufficient documentation

## 2022-06-18 NOTE — ED Notes (Signed)
Pt not answering to be roomed 3x  

## 2022-06-18 NOTE — ED Provider Triage Note (Signed)
  Emergency Medicine Provider Triage Evaluation Note  MRN:  [id number]  Arrival date & time: 06/18/22    Medically screening exam initiated at 12:11 AM.   CC:   Pain  HPI:  Nathan Norton is a 41 y.o. year-old male presents to the ED with chief complaint of pain all over and achy feet.  States this is his typical pain.  Also had a new wound to his right hand, but he states he doesn't want to talk about it.  History provided by History provided by patient. ROS:  -As included in HPI PE:   Vitals:   06/18/22 0005  BP: (!) 145/97  Pulse: (!) 115  Resp: 18  Temp: 98.3 F (36.8 C)  SpO2: 96%    Non-toxic appearing No respiratory distress Minor skin avulsion to right 5th knuckle MDM:    Patient was informed that the remainder of the evaluation will be completed by another provider, this initial triage assessment does not replace that evaluation, and the importance of remaining in the ED until their evaluation is complete.    Roxy Horseman, PA-C 06/18/22 0013

## 2022-06-18 NOTE — ED Triage Notes (Signed)
Patient reports generalized body aches today .

## 2022-06-18 NOTE — ED Notes (Signed)
Pt not answering to be roomed .  

## 2022-06-19 ENCOUNTER — Other Ambulatory Visit: Payer: Self-pay

## 2022-06-19 ENCOUNTER — Encounter (HOSPITAL_COMMUNITY): Payer: Self-pay

## 2022-06-19 ENCOUNTER — Emergency Department (HOSPITAL_COMMUNITY)
Admission: EM | Admit: 2022-06-19 | Discharge: 2022-06-20 | Disposition: A | Discharge status: Home / Self Care | Payer: Medicare (Managed Care) | Attending: Emergency Medicine | Admitting: Emergency Medicine

## 2022-06-19 DIAGNOSIS — F10920 Alcohol use, unspecified with intoxication, uncomplicated: Secondary | ICD-10-CM | POA: Diagnosis present

## 2022-06-19 DIAGNOSIS — M791 Myalgia, unspecified site: Secondary | ICD-10-CM | POA: Diagnosis not present

## 2022-06-19 LAB — RESP PANEL BY RT-PCR (FLU A&B, COVID) ARPGX2
Influenza A by PCR: NEGATIVE
Influenza B by PCR: NEGATIVE
SARS Coronavirus 2 by RT PCR: NEGATIVE

## 2022-06-19 MED ORDER — ACETAMINOPHEN 325 MG PO TABS
650.0000 mg | ORAL_TABLET | Freq: Once | ORAL | Status: AC
  Administered 2022-06-19: 650 mg via ORAL
  Filled 2022-06-19: qty 2

## 2022-06-19 MED ORDER — ACETAMINOPHEN ER 650 MG PO TBCR
650.0000 mg | EXTENDED_RELEASE_TABLET | Freq: Three times a day (TID) | ORAL | 0 refills | Status: DC | PRN
Start: 2022-06-19 — End: 2024-11-05

## 2022-06-19 MED ORDER — LORATADINE 10 MG PO TABS: 10.0000 mg | ORAL_TABLET | Freq: Every day | ORAL | 0 refills | Status: DC | PRN

## 2022-06-19 MED ORDER — LORATADINE 10 MG PO TABS
10.0000 mg | ORAL_TABLET | Freq: Once | ORAL | Status: AC
  Administered 2022-06-19: 10 mg via ORAL
  Filled 2022-06-19: qty 1

## 2022-06-19 NOTE — ED Notes (Signed)
Pt assisted into reclining chair in triage with one person assist.

## 2022-06-19 NOTE — ED Provider Notes (Signed)
[facility] EMERGENCY DEPARTMENT Provider Note   CSN: [id number] Arrival date & time: 06/18/22  2306     History  Chief Complaint  Patient presents with   Generalized Body Aches    Nathan Norton is a 41 y.o. male with a history of tobacco use, diabetes, alcohol abuse, hypertension, and schizoaffective disorder who presents to the emergency department with complaints of his allergies and body aches.  Patient states that he has had problems with his allergies for the past week or so with some congestion/scratchy throat, he has also had  some intermittent body aches.  No alleviating or aggravating factors.  No intervention prior to arrival.  Denies fever, dyspnea, hemoptysis, nausea, vomiting, chest pain, or abdominal pain.  HPI     Home Medications Prior to Admission medications   Medication Sig Start Date End Date Taking? Authorizing Provider  acetaminophen (TYLENOL) 500 MG tablet Take 1 tablet (500 mg total) by mouth every 6 (six) hours as needed. 12/25/21   Fayrene Helper, PA-C  cetirizine (ZYRTEC ALLERGY) 10 MG tablet Take 1 tablet (10 mg total) by mouth daily. 07/13/21   Rushie Chestnut, PA-C  clotrimazole (LOTRIMIN) 1 % cream Apply to affected area 2 times daily 02/19/22   Placido Sou, PA-C  loratadine (CLARITIN) 10 MG tablet Take 1 tablet (10 mg total) by mouth daily. 02/18/22 03/20/22  Edison Simon, MD      Allergies    Shellfish allergy    Review of Systems   Review of Systems  Constitutional:  Negative for chills and fever.  HENT:  Positive for congestion and postnasal drip. Negative for ear pain and sore throat.   Respiratory:  Negative for shortness of breath.   Cardiovascular:  Negative for chest pain.  Gastrointestinal:  Negative for abdominal pain, diarrhea and vomiting.  Musculoskeletal:  Positive for myalgias.  All other systems reviewed and are negative.   Physical Exam Updated Vital Signs BP 135/85 (BP Location: Right Arm)   Pulse  95   Temp 98.5 F (36.9 C) (Oral)   Resp 16   Ht 6\' 3"  (1.905 m)   Wt 99.8 kg   SpO2 100%   BMI 27.50 kg/m  Physical Exam Vitals and nursing note reviewed.  Constitutional:      General: He is not in acute distress.    Appearance: He is well-developed. He is not toxic-appearing.  HENT:     Head: Normocephalic and atraumatic.     Right Ear: Ear canal normal. Tympanic membrane is not perforated, erythematous, retracted or bulging.     Left Ear: Ear canal normal. Tympanic membrane is not perforated, erythematous, retracted or bulging.     Ears:     Comments: No mastoid erythema/swellng/tenderness.     Nose: Congestion present.     Right Sinus: No maxillary sinus tenderness or frontal sinus tenderness.     Left Sinus: No maxillary sinus tenderness or frontal sinus tenderness.     Comments: Boggy turbinates.     Mouth/Throat:     Pharynx: Oropharynx is clear. Uvula midline. No oropharyngeal exudate or posterior oropharyngeal erythema.     Comments: Posterior oropharynx is symmetric appearing. Patient tolerating own secretions without difficulty. No trismus. No drooling. No hot potato voice. No swelling beneath the tongue, submandibular compartment is soft.  Eyes:     General:        Right eye: No discharge.        Left eye: No discharge.     Conjunctiva/sclera:  Conjunctivae normal.  Cardiovascular:     Rate and Rhythm: Normal rate and regular rhythm.  Pulmonary:     Effort: Pulmonary effort is normal. No respiratory distress.     Breath sounds: Normal breath sounds. No wheezing, rhonchi or rales.  Abdominal:     General: There is no distension.     Palpations: Abdomen is soft.     Tenderness: There is no abdominal tenderness.  Musculoskeletal:     Cervical back: Neck supple. No rigidity.     Comments: Moving all extremities without difficulty.  Ambulatory.  Lymphadenopathy:     Cervical: No cervical adenopathy.  Skin:    General: Skin is warm and dry.     Findings: No rash.   Neurological:     Mental Status: He is alert.  Psychiatric:        Behavior: Behavior normal.     ED Results / Procedures / Treatments   Labs (all labs ordered are listed, but only abnormal results are displayed) Labs Reviewed  RESP PANEL BY RT-PCR (FLU A&B, COVID) ARPGX2    EKG None  Radiology No results found.  Procedures Procedures    Medications Ordered in ED Medications - No data to display  ED Course/ Medical Decision Making/ A&P                           Medical Decision Making Risk OTC drugs.   Patient presents to the emergency department with complaints of allergies and body aches.  He is nontoxic, resting comfortably, his vitals are within normal limits.  Chart/nursing note reviewed.  COVID/flu test ordered.   Exam is without signs of AOM, AOE, or mastoiditis. Oropharyngeal exam is benign. Afebrile- No sinus tenderness, doubt acute bacterial sinusitis. No meningeal signs. Lungs are CTA without focal adventitious sounds, no signs of increased work of breathing,  doubt CAP. Abdomen nontender w/o peritoneal signs.  Moving all extremities without difficulty and ambulatory, low suspicion for rhabdomyolysis.  Suspect viral versus allergic possible component of muscle soreness.  Given Tylenol and Claritin in the emergency department.  Overall seems appropriate for discharge at this time with supportive care. I discussed  treatment plan, need for follow-up, and return precautions with the patient. Provided opportunity for questions, patient confirmed understanding and is in agreement with plan.    Portions of this note were generated with Scientist, clinical (histocompatibility and immunogenetics). Dictation errors may occur despite best attempts at proofreading.   Final Clinical Impression(s) / ED Diagnoses Final diagnoses:  Nasal congestion    Rx / DC Orders ED Discharge Orders          Ordered    loratadine (CLARITIN) 10 MG tablet  Daily PRN        06/19/22 0446    acetaminophen  (TYLENOL 8 HOUR) 650 MG CR tablet  Every 8 hours PRN        06/19/22 0446              Darriel Sinquefield, Pleas Koch, PA-C 06/19/22 0525    Gilda Crease, MD 06/19/22 [phone]
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
## 2022-06-19 NOTE — Discharge Instructions (Addendum)
We will call you for COVID or flu test results are positive.  Please take Tylenol for 8 hours as needed for pain and Claritin daily as needed for allergies.  We have prescribed you new medication(s) today. Discuss the medications prescribed today with your pharmacist as they can have adverse effects and interactions with your other medicines including over the counter and prescribed medications. Seek medical evaluation if you start to experience new or abnormal symptoms after taking one of these medicines, seek care immediately if you start to experience difficulty breathing, feeling of your throat closing, facial swelling, or rash as these could be indications of a more serious allergic reaction   Follow-up with primary care.  Return to the emergency department for new or worsening symptoms including but not limited to new or worsening pain, trouble walking, trouble breathing, coughing up blood, passing out, or any other concerns.

## 2022-06-19 NOTE — ED Triage Notes (Signed)
Pt reports all over body pain and aches x 2 days, was here earlier today for same complaint

## 2022-06-20 NOTE — ED Notes (Signed)
Pt calm and cooperative. Respirations even and unlabored. NAD noted at this time

## 2022-06-20 NOTE — ED Notes (Signed)
Pt walked out of ED before being discharged. MD made aware.

## 2022-06-28 ENCOUNTER — Emergency Department (HOSPITAL_COMMUNITY)
Admission: EM | Admit: 2022-06-28 | Discharge: 2022-06-28 | Discharge status: Against Medical Advice | Payer: Medicare (Managed Care) | Attending: Emergency Medicine | Admitting: Emergency Medicine

## 2022-06-28 ENCOUNTER — Other Ambulatory Visit: Payer: Self-pay

## 2022-06-28 DIAGNOSIS — X58XXXA Exposure to other specified factors, initial encounter: Secondary | ICD-10-CM | POA: Diagnosis not present

## 2022-06-28 DIAGNOSIS — S60512A Abrasion of left hand, initial encounter: Secondary | ICD-10-CM | POA: Diagnosis present

## 2022-06-28 DIAGNOSIS — M7918 Myalgia, other site: Secondary | ICD-10-CM | POA: Insufficient documentation

## 2022-06-28 DIAGNOSIS — Y909 Presence of alcohol in blood, level not specified: Secondary | ICD-10-CM | POA: Insufficient documentation

## 2022-06-28 DIAGNOSIS — Z5321 Procedure and treatment not carried out due to patient leaving prior to being seen by health care provider: Secondary | ICD-10-CM | POA: Diagnosis not present

## 2022-06-28 DIAGNOSIS — F1010 Alcohol abuse, uncomplicated: Secondary | ICD-10-CM | POA: Diagnosis not present

## 2022-06-28 MED ORDER — IBUPROFEN 400 MG PO TABS
400.0000 mg | ORAL_TABLET | Freq: Once | ORAL | Status: AC | PRN
  Administered 2022-06-28: 400 mg via ORAL
  Filled 2022-06-28: qty 1

## 2022-06-28 NOTE — ED Notes (Signed)
Pt refusing to come to the back. Pt escorted out by [employer]

## 2022-06-28 NOTE — ED Triage Notes (Signed)
BIB [facility] after pt reports body aches. Per pt he forgot to take his tylenol today. Pt endorses ETOH on board ( x 2 beers). Pt has open abrasion on left knuckle.

## 2022-07-03 ENCOUNTER — Other Ambulatory Visit: Payer: Self-pay

## 2022-07-03 ENCOUNTER — Emergency Department (HOSPITAL_COMMUNITY)
Admission: EM | Admit: 2022-07-03 | Discharge: 2022-07-03 | Disposition: A | Discharge status: Home / Self Care | Payer: Medicare (Managed Care) | Attending: Emergency Medicine | Admitting: Emergency Medicine

## 2022-07-03 ENCOUNTER — Encounter (HOSPITAL_COMMUNITY): Payer: Self-pay

## 2022-07-03 DIAGNOSIS — T148XXA Other injury of unspecified body region, initial encounter: Secondary | ICD-10-CM | POA: Insufficient documentation

## 2022-07-03 DIAGNOSIS — Z5900 Homelessness unspecified: Secondary | ICD-10-CM | POA: Diagnosis present

## 2022-07-03 DIAGNOSIS — W57XXXA Bitten or stung by nonvenomous insect and other nonvenomous arthropods, initial encounter: Secondary | ICD-10-CM | POA: Insufficient documentation

## 2022-07-03 DIAGNOSIS — Y939 Activity, unspecified: Secondary | ICD-10-CM | POA: Diagnosis not present

## 2022-07-03 DIAGNOSIS — Z5941 Food insecurity: Secondary | ICD-10-CM | POA: Diagnosis not present

## 2022-07-03 MED ORDER — ACETAMINOPHEN 325 MG PO TABS
650.0000 mg | ORAL_TABLET | Freq: Once | ORAL | Status: DC
  Filled 2022-07-03: qty 2
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
## 2022-07-03 NOTE — Discharge Instructions (Signed)
Return to the ER with any new severe symptoms.

## 2022-07-03 NOTE — ED Triage Notes (Signed)
Pt to ED for hunger and for cream for mosquito bites. Pt A&Ox4, NAD noted, ambulatory to triage.

## 2022-07-03 NOTE — ED Notes (Signed)
Pt ambulated out, stating " Okay I am good I am leaving"

## 2022-08-01 ENCOUNTER — Other Ambulatory Visit: Payer: Self-pay

## 2022-08-01 ENCOUNTER — Encounter (HOSPITAL_COMMUNITY): Payer: Self-pay | Admitting: *Deleted

## 2022-08-01 ENCOUNTER — Emergency Department (HOSPITAL_COMMUNITY)
Admission: EM | Admit: 2022-08-01 | Discharge: 2022-08-01 | Disposition: A | Discharge status: Home / Self Care | Payer: Medicare (Managed Care) | Attending: Emergency Medicine | Admitting: Emergency Medicine

## 2022-08-01 DIAGNOSIS — F10120 Alcohol abuse with intoxication, uncomplicated: Secondary | ICD-10-CM | POA: Insufficient documentation

## 2022-08-01 DIAGNOSIS — F1910 Other psychoactive substance abuse, uncomplicated: Secondary | ICD-10-CM | POA: Diagnosis not present
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
## 2022-08-01 NOTE — Discharge Instructions (Addendum)
Today you were seen in the emergency department after overdose.  You are given Narcan by bystanders and woke up.  Please follow-up with our community health and wellness several days.  Return immediately to the emergency department if you experience any of the following: Severe headache, weakness or numbness of your arms or legs, or any other concerning symptoms.

## 2022-08-01 NOTE — ED Notes (Signed)
Patient sleeping at this time.
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
## 2022-08-16 ENCOUNTER — Other Ambulatory Visit: Payer: Self-pay

## 2022-08-16 ENCOUNTER — Encounter (HOSPITAL_COMMUNITY): Payer: Self-pay | Admitting: Emergency Medicine

## 2022-08-16 ENCOUNTER — Emergency Department (HOSPITAL_COMMUNITY)
Admission: EM | Admit: 2022-08-16 | Discharge: 2022-08-16 | Discharge status: Against Medical Advice | Payer: Medicare (Managed Care) | Attending: Emergency Medicine | Admitting: Emergency Medicine

## 2022-08-16 DIAGNOSIS — S91352A Open bite, left foot, initial encounter: Secondary | ICD-10-CM | POA: Insufficient documentation

## 2022-08-16 DIAGNOSIS — W5911XA Bitten by nonvenomous snake, initial encounter: Secondary | ICD-10-CM | POA: Insufficient documentation

## 2022-08-16 DIAGNOSIS — Z5321 Procedure and treatment not carried out due to patient leaving prior to being seen by health care provider: Secondary | ICD-10-CM | POA: Diagnosis not present

## 2022-08-16 NOTE — ED Triage Notes (Signed)
Patient reports snake bite at left foot , unable to recall when he was bitten , flight of ideas during initial encounter/unable to focus / ETOH intoxication . Ambulatory/respirations unlabored .

## 2022-08-16 NOTE — ED Notes (Addendum)
PT was called for room at 10:32 no answer PT left AMA

## 2022-08-26 ENCOUNTER — Encounter (HOSPITAL_COMMUNITY): Payer: Self-pay

## 2022-08-26 ENCOUNTER — Emergency Department (HOSPITAL_COMMUNITY)
Admission: EM | Admit: 2022-08-26 | Discharge: 2022-08-26 | Disposition: A | Discharge status: Home / Self Care | Payer: Medicare (Managed Care) | Attending: Emergency Medicine | Admitting: Emergency Medicine

## 2022-08-26 ENCOUNTER — Other Ambulatory Visit: Payer: Self-pay

## 2022-08-26 DIAGNOSIS — Z760 Encounter for issue of repeat prescription: Secondary | ICD-10-CM | POA: Diagnosis present

## 2022-08-26 DIAGNOSIS — Z5321 Procedure and treatment not carried out due to patient leaving prior to being seen by health care provider: Secondary | ICD-10-CM | POA: Diagnosis not present
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
## 2022-08-26 NOTE — ED Notes (Signed)
Pt left advised staff that he would be back.

## 2022-08-26 NOTE — ED Triage Notes (Signed)
PT HERE FOR LOTRIMIN FOR FEET. PT HAS BEEN WALKING THROUGH WATER AND NEEDS CREAM

## 2022-09-01 ENCOUNTER — Other Ambulatory Visit: Payer: Self-pay

## 2022-09-01 ENCOUNTER — Emergency Department (HOSPITAL_COMMUNITY)
Admission: EM | Admit: 2022-09-01 | Discharge: 2022-09-01 | Disposition: A | Discharge status: Home / Self Care | Payer: Medicare (Managed Care) | Attending: Emergency Medicine | Admitting: Emergency Medicine

## 2022-09-01 ENCOUNTER — Encounter (HOSPITAL_COMMUNITY): Payer: Self-pay | Admitting: Emergency Medicine

## 2022-09-01 DIAGNOSIS — Z5900 Homelessness unspecified: Secondary | ICD-10-CM | POA: Diagnosis not present

## 2022-09-01 DIAGNOSIS — Z711 Person with feared health complaint in whom no diagnosis is made: Secondary | ICD-10-CM | POA: Diagnosis not present
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
## 2022-09-02 ENCOUNTER — Other Ambulatory Visit: Payer: Self-pay

## 2022-09-02 ENCOUNTER — Encounter (HOSPITAL_COMMUNITY): Payer: Self-pay | Admitting: Emergency Medicine

## 2022-09-02 ENCOUNTER — Emergency Department (HOSPITAL_COMMUNITY)
Admission: EM | Admit: 2022-09-02 | Discharge: 2022-09-02 | Discharge status: Against Medical Advice | Payer: Medicare (Managed Care) | Attending: Emergency Medicine | Admitting: Emergency Medicine

## 2022-09-02 DIAGNOSIS — Z711 Person with feared health complaint in whom no diagnosis is made: Secondary | ICD-10-CM | POA: Diagnosis not present

## 2022-09-02 DIAGNOSIS — Z5321 Procedure and treatment not carried out due to patient leaving prior to being seen by health care provider: Secondary | ICD-10-CM | POA: Insufficient documentation

## 2022-09-02 DIAGNOSIS — Z5948 Other specified lack of adequate food: Secondary | ICD-10-CM | POA: Insufficient documentation

## 2022-09-02 NOTE — ED Notes (Signed)
Pt given socks in triage

## 2022-09-02 NOTE — ED Triage Notes (Signed)
Patient here stating " I need food,snacks and a drink and socks because my feet are wet". Patient denies further medical complaints. Patient also stating "I need a bus pass".

## 2022-09-02 NOTE — ED Notes (Signed)
Patient called for room: no response °

## 2022-09-08 ENCOUNTER — Encounter (HOSPITAL_COMMUNITY): Payer: Self-pay | Admitting: Emergency Medicine

## 2022-09-08 ENCOUNTER — Emergency Department (HOSPITAL_COMMUNITY)
Admission: EM | Admit: 2022-09-08 | Discharge: 2022-09-08 | Discharge status: Against Medical Advice | Payer: Medicare (Managed Care) | Attending: Emergency Medicine | Admitting: Emergency Medicine

## 2022-09-08 NOTE — ED Triage Notes (Signed)
Patient requesting bus ticket , sandwich and socks .

## 2022-09-08 NOTE — ED Notes (Signed)
Patient given sandwiches and soda .

## 2022-09-10 ENCOUNTER — Emergency Department (HOSPITAL_COMMUNITY)
Admission: EM | Admit: 2022-09-10 | Discharge: 2022-09-10 | Disposition: A | Discharge status: Home / Self Care | Payer: Medicare (Managed Care) | Attending: Emergency Medicine | Admitting: Emergency Medicine

## 2022-09-10 DIAGNOSIS — Z5900 Homelessness unspecified: Secondary | ICD-10-CM | POA: Insufficient documentation

## 2022-09-10 DIAGNOSIS — Z711 Person with feared health complaint in whom no diagnosis is made: Secondary | ICD-10-CM | POA: Insufficient documentation

## 2022-09-10 NOTE — ED Triage Notes (Signed)
Pt requesting bus pass and bag lunch.

## 2022-09-10 NOTE — ED Provider Notes (Signed)
  [facility] EMERGENCY DEPARTMENT Provider Note   CSN: [id number] Arrival date & time: 09/10/22  2143     History  Chief Complaint  Patient presents with   Homeless    Nathan Norton is a 41 y.o. male.  41 year old male presents with request for sandwich tray and bus pass.  He has no complaints or concerns tonight.       Home Medications Prior to Admission medications   Medication Sig Start Date End Date Taking? Authorizing Provider  acetaminophen (TYLENOL 8 HOUR) 650 MG CR tablet Take 1 tablet (650 mg total) by mouth every 8 (eight) hours as needed for pain. 06/19/22   Petrucelli, Pleas Koch, PA-C  clotrimazole (LOTRIMIN) 1 % cream Apply to affected area 2 times daily 02/19/22   Placido Sou, PA-C  loratadine (CLARITIN) 10 MG tablet Take 1 tablet (10 mg total) by mouth daily as needed for allergies or rhinitis. 06/19/22   Petrucelli, Pleas Koch, PA-C      Allergies    Shellfish allergy    Review of Systems   Review of Systems Negative except as per HPI Physical Exam Updated Vital Signs BP 129/85 (BP Location: Right Arm)   Pulse 91   Temp 98.2 F (36.8 C) (Oral)   Resp 18   SpO2 98%  Physical Exam Vitals and nursing note reviewed.  Constitutional:      General: He is not in acute distress.    Appearance: He is well-developed. He is not diaphoretic.  HENT:     Head: Normocephalic and atraumatic.  Pulmonary:     Effort: Pulmonary effort is normal.  Neurological:     Mental Status: He is alert and oriented to person, place, and time.  Psychiatric:        Behavior: Behavior normal.     ED Results / Procedures / Treatments   Labs (all labs ordered are listed, but only abnormal results are displayed) Labs Reviewed - No data to display  EKG None  Radiology No results found.  Procedures Procedures    Medications Ordered in ED Medications - No data to display  ED Course/ Medical Decision Making/ A&P                            Medical Decision Making  Patient brought in by EMS requesting a sandwich tray and a bus pass.  He has no complaints or concerns tonight.        Final Clinical Impression(s) / ED Diagnoses Final diagnoses:  Homeless    Rx / DC Orders ED Discharge Orders     None         Alden Hipp 09/10/22 2157    Terald Sleeper, MD 09/10/22 2223

## 2022-09-12 ENCOUNTER — Other Ambulatory Visit: Payer: Self-pay

## 2022-09-12 ENCOUNTER — Emergency Department (HOSPITAL_COMMUNITY)
Admission: EM | Admit: 2022-09-12 | Discharge: 2022-09-12 | Disposition: A | Discharge status: Home / Self Care | Payer: Medicare (Managed Care) | Attending: Emergency Medicine | Admitting: Emergency Medicine

## 2022-09-12 ENCOUNTER — Encounter (HOSPITAL_COMMUNITY): Payer: Self-pay | Admitting: Emergency Medicine

## 2022-09-12 DIAGNOSIS — T730XXD Starvation, subsequent encounter: Secondary | ICD-10-CM

## 2022-09-12 DIAGNOSIS — M1990 Unspecified osteoarthritis, unspecified site: Secondary | ICD-10-CM | POA: Diagnosis present

## 2022-09-12 DIAGNOSIS — X398XXA Other exposure to forces of nature, initial encounter: Secondary | ICD-10-CM | POA: Insufficient documentation

## 2022-09-12 MED ORDER — ACETAMINOPHEN 500 MG PO TABS
1000.0000 mg | ORAL_TABLET | Freq: Once | ORAL | Status: AC
Start: 2022-09-12 — End: 2022-09-12
  Administered 2022-09-12: 1000 mg via ORAL
  Filled 2022-09-12: qty 2
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
## 2022-09-13 ENCOUNTER — Emergency Department (HOSPITAL_COMMUNITY)
Admission: EM | Admit: 2022-09-13 | Discharge: 2022-09-13 | Discharge status: Against Medical Advice | Payer: Medicare (Managed Care) | Attending: Emergency Medicine | Admitting: Emergency Medicine

## 2022-09-13 ENCOUNTER — Other Ambulatory Visit: Payer: Self-pay

## 2022-09-13 ENCOUNTER — Encounter (HOSPITAL_COMMUNITY): Payer: Self-pay | Admitting: Emergency Medicine

## 2022-09-13 DIAGNOSIS — Z5321 Procedure and treatment not carried out due to patient leaving prior to being seen by health care provider: Secondary | ICD-10-CM | POA: Diagnosis not present

## 2022-09-13 DIAGNOSIS — M2550 Pain in unspecified joint: Secondary | ICD-10-CM | POA: Diagnosis present

## 2022-09-13 DIAGNOSIS — M1990 Unspecified osteoarthritis, unspecified site: Secondary | ICD-10-CM | POA: Diagnosis not present

## 2022-09-13 NOTE — ED Triage Notes (Signed)
Pt reported to ED requesting socks and tylenol for this arthritis.

## 2022-09-13 NOTE — ED Notes (Signed)
CALL PATIENT X5 NO ANSWER

## 2022-09-14 ENCOUNTER — Emergency Department (HOSPITAL_COMMUNITY)
Admission: EM | Admit: 2022-09-14 | Discharge: 2022-09-14 | Discharge status: Against Medical Advice | Payer: Medicare (Managed Care) | Attending: Emergency Medicine | Admitting: Emergency Medicine

## 2022-09-14 ENCOUNTER — Encounter (HOSPITAL_COMMUNITY): Payer: Self-pay | Admitting: Emergency Medicine

## 2022-09-14 ENCOUNTER — Other Ambulatory Visit: Payer: Self-pay

## 2022-09-14 DIAGNOSIS — M2550 Pain in unspecified joint: Secondary | ICD-10-CM | POA: Diagnosis not present

## 2022-09-14 DIAGNOSIS — Z5321 Procedure and treatment not carried out due to patient leaving prior to being seen by health care provider: Secondary | ICD-10-CM | POA: Diagnosis not present

## 2022-09-14 NOTE — ED Triage Notes (Signed)
Pt c/o joint pain and states he needs Tylenol

## 2022-09-14 NOTE — ED Notes (Signed)
Called pt x2 for vitals, no response. °

## 2022-09-15 ENCOUNTER — Other Ambulatory Visit: Payer: Self-pay

## 2022-09-15 ENCOUNTER — Encounter (HOSPITAL_COMMUNITY): Payer: Self-pay | Admitting: Emergency Medicine

## 2022-09-15 ENCOUNTER — Emergency Department (HOSPITAL_COMMUNITY)
Admission: EM | Admit: 2022-09-15 | Discharge: 2022-09-15 | Disposition: A | Discharge status: Home / Self Care | Payer: Medicare (Managed Care) | Attending: Emergency Medicine | Admitting: Emergency Medicine

## 2022-09-15 DIAGNOSIS — M2550 Pain in unspecified joint: Secondary | ICD-10-CM | POA: Insufficient documentation

## 2022-09-15 DIAGNOSIS — Z5900 Homelessness unspecified: Secondary | ICD-10-CM | POA: Diagnosis not present

## 2022-09-15 DIAGNOSIS — I10 Essential (primary) hypertension: Secondary | ICD-10-CM | POA: Insufficient documentation

## 2022-09-15 DIAGNOSIS — E119 Type 2 diabetes mellitus without complications: Secondary | ICD-10-CM | POA: Diagnosis not present

## 2022-09-15 LAB — CBG MONITORING, ED: Glucose-Capillary: 107 mg/dL — ABNORMAL HIGH (ref 70–99)

## 2022-09-15 NOTE — ED Triage Notes (Signed)
Patient here with compliant of joint pain that he believes is related to the weather getting colder. Patient is alert, oriented, ambulating independently with steady gait, and is in no apparent distress at this time.
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
## 2022-09-15 NOTE — ED Provider Notes (Signed)
[facility] EMERGENCY DEPARTMENT Provider Note   CSN: [id number] Arrival date & time: 09/15/22  5885     History  Chief Complaint  Patient presents with   Joint Pain    Nathan Norton is a 41 y.o. male.  Pt is a 41 yo male who has a pmhx significant for schizoaffective d/o, bipolar d/o, TBI, dm and htn.  Pt said he has joint pain which he thinks is from the weather changing.  He is hungry and has wet socks.  No cp, sob.  No f/c.       Home Medications Prior to Admission medications   Medication Sig Start Date End Date Taking? Authorizing Provider  acetaminophen (TYLENOL 8 HOUR) 650 MG CR tablet Take 1 tablet (650 mg total) by mouth every 8 (eight) hours as needed for pain. 06/19/22   Petrucelli, Glynda Jaeger, PA-C  clotrimazole (LOTRIMIN) 1 % cream Apply to affected area 2 times daily 02/19/22   Rayna Sexton, PA-C  loratadine (CLARITIN) 10 MG tablet Take 1 tablet (10 mg total) by mouth daily as needed for allergies or rhinitis. 06/19/22   Petrucelli, Glynda Jaeger, PA-C      Allergies    Shellfish allergy    Review of Systems   Review of Systems  Musculoskeletal:  Positive for arthralgias.  All other systems reviewed and are negative.   Physical Exam Updated Vital Signs BP 132/83 (BP Location: Left Arm)   Pulse 81   Temp 98.5 F (36.9 C) (Oral)   Resp 13   SpO2 99%  Physical Exam Vitals and nursing note reviewed.  Constitutional:      Appearance: Normal appearance.  HENT:     Head: Normocephalic and atraumatic.     Right Ear: External ear normal.     Left Ear: External ear normal.     Nose: Nose normal.     Mouth/Throat:     Mouth: Mucous membranes are moist.     Pharynx: Oropharynx is clear.  Eyes:     Extraocular Movements: Extraocular movements intact.     Conjunctiva/sclera: Conjunctivae normal.     Pupils: Pupils are equal, round, and reactive to light.  Cardiovascular:     Rate and Rhythm: Normal rate and regular rhythm.      Pulses: Normal pulses.     Heart sounds: Normal heart sounds.  Pulmonary:     Effort: Pulmonary effort is normal.     Breath sounds: Normal breath sounds.  Abdominal:     General: Abdomen is flat. Bowel sounds are normal.     Palpations: Abdomen is soft.  Musculoskeletal:        General: Normal range of motion.     Cervical back: Normal range of motion and neck supple.  Skin:    General: Skin is warm.     Capillary Refill: Capillary refill takes less than 2 seconds.  Neurological:     General: No focal deficit present.     Mental Status: He is alert and oriented to person, place, and time.  Psychiatric:        Mood and Affect: Mood normal.        Behavior: Behavior normal.     ED Results / Procedures / Treatments   Labs (all labs ordered are listed, but only abnormal results are displayed) Labs Reviewed  CBG MONITORING, ED - Abnormal; Notable for the following components:      Result Value   Glucose-Capillary 107 (*)    All other  components within normal limits    EKG None  Radiology No results found.  Procedures Procedures    Medications Ordered in ED Medications - No data to display  ED Course/ Medical Decision Making/ A&P                           Medical Decision Making  Pt provided with new socks, a sandwich.  There is a listed pmhx of dm and htn.  However, cbg is 107.  BP is nl.  Pt is not on medication for either.  He does not need meds for these conditions at this time.  Pt d/w SW.  She said his address is listed as the [facility] and he is in contact with them.  Homeless resources provided.  Pt is stable for d/c.  Return if worse.         Final Clinical Impression(s) / ED Diagnoses Final diagnoses:  Homelessness    Rx / DC Orders ED Discharge Orders     None         Jacalyn Lefevre, MD 09/15/22 1047

## 2022-09-15 NOTE — Care Management (Signed)
Patient has his address listed as the [facility]. He is =in contact with them, they are open Monday-Friday,. Homeless resources on DC instructions

## 2022-09-17 DIAGNOSIS — Z5900 Homelessness unspecified: Secondary | ICD-10-CM | POA: Insufficient documentation

## 2022-09-17 DIAGNOSIS — T730XXA Starvation, initial encounter: Secondary | ICD-10-CM | POA: Insufficient documentation

## 2022-09-17 DIAGNOSIS — X58XXXA Exposure to other specified factors, initial encounter: Secondary | ICD-10-CM | POA: Insufficient documentation

## 2022-09-17 DIAGNOSIS — E119 Type 2 diabetes mellitus without complications: Secondary | ICD-10-CM | POA: Insufficient documentation

## 2022-09-17 DIAGNOSIS — Z765 Malingerer [conscious simulation]: Secondary | ICD-10-CM | POA: Insufficient documentation

## 2022-09-17 DIAGNOSIS — I10 Essential (primary) hypertension: Secondary | ICD-10-CM | POA: Diagnosis not present

## 2022-09-17 DIAGNOSIS — Z760 Encounter for issue of repeat prescription: Secondary | ICD-10-CM | POA: Diagnosis present

## 2022-09-17 DIAGNOSIS — Z79899 Other long term (current) drug therapy: Secondary | ICD-10-CM | POA: Insufficient documentation

## 2022-09-18 ENCOUNTER — Emergency Department (HOSPITAL_COMMUNITY)
Admission: EM | Admit: 2022-09-18 | Discharge: 2022-09-18 | Disposition: A | Discharge status: Home / Self Care | Payer: Medicare (Managed Care) | Source: Home / Self Care | Attending: Emergency Medicine | Admitting: Emergency Medicine

## 2022-09-18 ENCOUNTER — Encounter (HOSPITAL_COMMUNITY): Payer: Self-pay | Admitting: Emergency Medicine

## 2022-09-18 ENCOUNTER — Emergency Department (HOSPITAL_COMMUNITY)
Admission: EM | Admit: 2022-09-18 | Discharge: 2022-09-18 | Disposition: A | Discharge status: Home / Self Care | Payer: Medicare (Managed Care) | Attending: Emergency Medicine | Admitting: Emergency Medicine

## 2022-09-18 ENCOUNTER — Other Ambulatory Visit: Payer: Self-pay

## 2022-09-18 DIAGNOSIS — E119 Type 2 diabetes mellitus without complications: Secondary | ICD-10-CM | POA: Insufficient documentation

## 2022-09-18 DIAGNOSIS — I10 Essential (primary) hypertension: Secondary | ICD-10-CM | POA: Insufficient documentation

## 2022-09-18 DIAGNOSIS — Z765 Malingerer [conscious simulation]: Secondary | ICD-10-CM

## 2022-09-18 DIAGNOSIS — Z760 Encounter for issue of repeat prescription: Secondary | ICD-10-CM

## 2022-09-18 DIAGNOSIS — T730XXA Starvation, initial encounter: Secondary | ICD-10-CM | POA: Insufficient documentation

## 2022-09-18 DIAGNOSIS — Z79899 Other long term (current) drug therapy: Secondary | ICD-10-CM | POA: Insufficient documentation

## 2022-09-18 NOTE — ED Provider Notes (Signed)
[facility] EMERGENCY DEPARTMENT Provider Note   CSN: [id number] Arrival date & time: 09/18/22  2146     History  Chief Complaint  Patient presents with   Requesting Milk and Sandwich     Nathan Norton is a 41 y.o. male with a history of hypertension, homelessness, schizoaffective disorder, and diabetes who presents to the emergency department requesting milk and a sandwich tonight.  Patient states he is hungry, he would like milk, a sandwich, as well as some peanut butter if possible.  He has no other complaints.  He otherwise states at baseline.  He denies any pain, fever, increased work of breathing, vomiting, dizziness, or syncope.  HPI     Home Medications Prior to Admission medications   Medication Sig Start Date End Date Taking? Authorizing Provider  acetaminophen (TYLENOL 8 HOUR) 650 MG CR tablet Take 1 tablet (650 mg total) by mouth every 8 (eight) hours as needed for pain. 06/19/22   Miya Luviano, Glynda Jaeger, PA-C  clotrimazole (LOTRIMIN) 1 % cream Apply to affected area 2 times daily 02/19/22   Rayna Sexton, PA-C  loratadine (CLARITIN) 10 MG tablet Take 1 tablet (10 mg total) by mouth daily as needed for allergies or rhinitis. 06/19/22   Jonathan Corpus R, PA-C      Allergies    Shellfish allergy    Review of Systems   Review of Systems  Constitutional:  Negative for chills and fever.  Respiratory:  Negative for shortness of breath.   Cardiovascular:  Negative for chest pain.  Gastrointestinal:  Negative for abdominal pain and vomiting.  Neurological:  Negative for dizziness and syncope.  All other systems reviewed and are negative.   Physical Exam Updated Vital Signs BP 115/73 (BP Location: Right Arm)   Pulse 74   Temp 98.8 F (37.1 C)   Resp 15   SpO2 97%  Physical Exam Vitals and nursing note reviewed.  Constitutional:      General: He is not in acute distress.    Appearance: He is well-developed. He is not toxic-appearing.   HENT:     Head: Normocephalic and atraumatic.  Eyes:     General:        Right eye: No discharge.        Left eye: No discharge.     Conjunctiva/sclera: Conjunctivae normal.  Cardiovascular:     Rate and Rhythm: Normal rate and regular rhythm.  Pulmonary:     Effort: No respiratory distress.     Breath sounds: Normal breath sounds. No wheezing or rales.  Abdominal:     General: There is no distension.     Palpations: Abdomen is soft.     Tenderness: There is no abdominal tenderness.  Musculoskeletal:     Cervical back: Neck supple.  Skin:    General: Skin is warm and dry.  Neurological:     Mental Status: He is alert.     Comments: Clear speech.   Psychiatric:        Behavior: Behavior normal.     ED Results / Procedures / Treatments   Labs (all labs ordered are listed, but only abnormal results are displayed) Labs Reviewed - No data to display  EKG None  Radiology No results found.  Procedures Procedures    Medications Ordered in ED Medications - No data to display  ED Course/ Medical Decision Making/ A&P  Medical Decision Making  Patient presents to the emergency department requesting food and beverage.  He is nontoxic, resting comfortably, vitals are within normal limits.  Physical exam is relatively benign.  Chart/nursing note reviewed for additional history, multiple prior ED visits.  Social determinants include homelessness.    Patient without any specific complaints, he was provided something to eat and drink, and feels ready to leave. He appears appropriate for discharge, do not suspect acute emergent pathology at this time.  Final Clinical Impression(s) / ED Diagnoses Final diagnoses:  Hungry, initial encounter    Rx / DC Orders ED Discharge Orders     None         Cherly Anderson, PA-C 09/18/22 2356    Sabas Sous, MD 09/19/22 [phone]

## 2022-09-18 NOTE — ED Provider Notes (Signed)
[facility] EMERGENCY DEPARTMENT Provider Note   CSN: [id number] Arrival date & time: 09/17/22  2311     History  Chief Complaint  Patient presents with   Medication Refill    Nathan Norton is a 41 y.o. male.  Patient with history of schizophrenia presents today requesting a shot of Abilify and Clonazepam. He states that he has been here previously and been given a shot of same, however states the last time he had either of these medications given or prescribed to him was 'years ago.' When asked about why he needs this medication, he responded 'I dont know, I was just walking down the street and thought about it.' He denies any SI/HI, AVH. Also denies any somatic complaints. When asked further about why he is here, he states "I need a bus pass and Im hungry.'  The history is provided by the patient. No language interpreter was used.  Medication Refill      Home Medications Prior to Admission medications   Medication Sig Start Date End Date Taking? Authorizing Provider  acetaminophen (TYLENOL 8 HOUR) 650 MG CR tablet Take 1 tablet (650 mg total) by mouth every 8 (eight) hours as needed for pain. 06/19/22   Petrucelli, Pleas Koch, PA-C  clotrimazole (LOTRIMIN) 1 % cream Apply to affected area 2 times daily 02/19/22   Placido Sou, PA-C  loratadine (CLARITIN) 10 MG tablet Take 1 tablet (10 mg total) by mouth daily as needed for allergies or rhinitis. 06/19/22   Petrucelli, Pleas Koch, PA-C      Allergies    Shellfish allergy    Review of Systems   Review of Systems  All other systems reviewed and are negative.   Physical Exam Updated Vital Signs BP 139/78 (BP Location: Right Arm)   Pulse 97   Temp 98.4 F (36.9 C) (Oral)   Resp 17   SpO2 99%  Physical Exam Vitals and nursing note reviewed.  Constitutional:      General: He is not in acute distress.    Appearance: Normal appearance. He is normal weight. He is not ill-appearing, toxic-appearing or  diaphoretic.  HENT:     Head: Normocephalic and atraumatic.  Cardiovascular:     Rate and Rhythm: Normal rate.  Pulmonary:     Effort: Pulmonary effort is normal. No respiratory distress.  Musculoskeletal:        General: Normal range of motion.     Cervical back: Normal range of motion.  Skin:    General: Skin is warm and dry.  Neurological:     General: No focal deficit present.     Mental Status: He is alert.  Psychiatric:        Mood and Affect: Mood normal.        Behavior: Behavior normal.     Comments: Does not appear to be responding to internal stimuli     ED Results / Procedures / Treatments   Labs (all labs ordered are listed, but only abnormal results are displayed) Labs Reviewed - No data to display  EKG None  Radiology No results found.  Procedures Procedures    Medications Ordered in ED Medications - No data to display  ED Course/ Medical Decision Making/ A&P                           Medical Decision Making  Patient presents today requesting Abilify and Clonazepam. He is afebrile, non-toxic appearing, and in  no acute distress with reassuring vital signs. He is also alert and oriented and neurologically intact without deficits.  He is not appear to be responding to internal stimuli.  He also denies SI/HI, or AVH.  Upon chart review, it appears that patient was prescribed Abilify but the last time he took it was more than a year ago.  Given this, no indication for this medication today.  Upon further questioning, patient states he really just came in for a sandwich and a bus pass.  Given same.  We will also give resources and information for [place].  No further emergent concerns at this time, patient is stable for discharge.  Educated on red flag symptoms that would prompt immediate return.  Patient is understanding and amenable with plan, discharged in stable condition.   Final Clinical Impression(s) / ED Diagnoses Final diagnoses:  Medication refill   Malingering    Rx / DC Orders ED Discharge Orders     None     An After Visit Summary was printed and given to the patient.     Nestor Lewandowsky 09/18/22 0119    Truddie Hidden, MD 09/18/22 [phone]

## 2022-09-18 NOTE — ED Triage Notes (Signed)
Patient requesting prescription for his Abilify .

## 2022-09-18 NOTE — ED Triage Notes (Signed)
Patient asking for sandwich and milk. No complaints .

## 2022-09-18 NOTE — Discharge Instructions (Addendum)
I recommend that you go to behavioral health urgent care by [facility] for your needs.  Return if development of any new or worsening symptoms.

## 2022-09-20 ENCOUNTER — Encounter (HOSPITAL_COMMUNITY): Payer: Self-pay | Admitting: Emergency Medicine

## 2022-09-20 ENCOUNTER — Emergency Department (HOSPITAL_COMMUNITY)
Admission: EM | Admit: 2022-09-20 | Discharge: 2022-09-21 | Disposition: A | Discharge status: Home / Self Care | Payer: Medicare (Managed Care) | Source: Home / Self Care | Attending: Emergency Medicine | Admitting: Emergency Medicine

## 2022-09-20 ENCOUNTER — Emergency Department (HOSPITAL_COMMUNITY)
Admission: EM | Admit: 2022-09-20 | Discharge: 2022-09-20 | Disposition: A | Discharge status: Home / Self Care | Payer: Medicare (Managed Care) | Attending: Emergency Medicine | Admitting: Emergency Medicine

## 2022-09-20 ENCOUNTER — Other Ambulatory Visit: Payer: Self-pay

## 2022-09-20 DIAGNOSIS — Z5900 Homelessness unspecified: Secondary | ICD-10-CM

## 2022-09-20 DIAGNOSIS — M79672 Pain in left foot: Secondary | ICD-10-CM | POA: Insufficient documentation

## 2022-09-20 DIAGNOSIS — I10 Essential (primary) hypertension: Secondary | ICD-10-CM | POA: Insufficient documentation

## 2022-09-20 DIAGNOSIS — X58XXXA Exposure to other specified factors, initial encounter: Secondary | ICD-10-CM | POA: Insufficient documentation

## 2022-09-20 DIAGNOSIS — M79671 Pain in right foot: Secondary | ICD-10-CM | POA: Insufficient documentation

## 2022-09-20 DIAGNOSIS — E119 Type 2 diabetes mellitus without complications: Secondary | ICD-10-CM | POA: Insufficient documentation

## 2022-09-20 DIAGNOSIS — T730XXA Starvation, initial encounter: Secondary | ICD-10-CM | POA: Insufficient documentation

## 2022-09-20 NOTE — ED Provider Notes (Signed)
  [facility] Emergency Department Provider Note MRN:  [id number]  Arrival date & time: 09/20/22     Chief Complaint   Social issues History of Present Illness   Nathan Norton is a 41 y.o. year-old male presents to the ED with chief complaint of states that he has been out walking around, states that he is hungry and that his feet are tired.  Patient suffers from housing instability.  He is seen frequently in the emergency department.  He denies new complaints.  History provided by patient.   Review of Systems  Pertinent positive and negative review of systems noted in HPI.    Physical Exam   Vitals:   09/20/22 0030  BP: 123/82  Pulse: 89  Resp: 16  Temp: 98.2 F (36.8 C)  SpO2: 96%    CONSTITUTIONAL:  well-appearing, NAD NEURO:  Alert and oriented x 3, CN 3-12 grossly intact EYES:  eyes equal and reactive ENT/NECK:  Supple, no stridor  CARDIO:  appears well-perfused  PULM:  No respiratory distress,  GI/GU:  non-distended,  MSK/SPINE:  No gross deformities, no edema, moves all extremities  SKIN:  no rash, atraumatic   *Additional and/or pertinent findings included in MDM below  Diagnostic and Interventional Summary    EKG Interpretation  Date/Time:    Ventricular Rate:    PR Interval:    QRS Duration:   QT Interval:    QTC Calculation:   R Axis:     Text Interpretation:         Labs Reviewed - No data to display  No orders to display    Medications - No data to display   Procedures  /  Critical Care Procedures  ED Course and Medical Decision Making  I have reviewed the triage vital signs, the nursing notes, and pertinent available records from the EMR.  Social Determinants Affecting Complexity of Care: Patient has no clinically significant social determinants affecting this chief complaint..   ED Course:    Medical Decision Making   Patient given some food.  He is here frequently for the same.  Vital signs are  stable.  He appears nontoxic.  I do not think that he requires hospitalization or admission or further consult.  Feel that he can be safely discharged.  Resource guide given. Consultants: No consultations were needed in caring for this patient.   Treatment and Plan: Emergency department workup does not suggest an emergent condition requiring admission or immediate intervention beyond  what has been performed at this time. The patient is safe for discharge and has  been instructed to return immediately for worsening symptoms, change in  symptoms or any other concerns    Final Clinical Impressions(s) / ED Diagnoses     ICD-10-CM   1. Hungry, initial encounter  T73.0XXA     2. Pain in both feet  M79.671    M79.672       ED Discharge Orders     None         Discharge Instructions Discussed with and Provided to Patient:   Discharge Instructions   None      Montine Circle, PA-C 09/20/22 2683    Mesner, Corene Cornea, MD 09/20/22 [phone]

## 2022-09-20 NOTE — ED Triage Notes (Signed)
Pt reported to ED requesting milk and peanut butter. No other requests at this time.

## 2022-09-20 NOTE — ED Triage Notes (Signed)
Pt presents with the following complaints that he would like addressed today  - bilateral tingling in hands and feet x 30 min this episode. These episodes have been occurring "all of [his] life" -hunger -feeling damp from the rain - bilateral knee pain, h/o arthritis per pt, has tried diclofenac cream in past without relief

## 2022-09-21 MED ORDER — ACETAMINOPHEN 500 MG PO TABS
1000.0000 mg | ORAL_TABLET | Freq: Once | ORAL | Status: AC
  Administered 2022-09-21: 1000 mg via ORAL
  Filled 2022-09-21: qty 2

## 2022-09-21 NOTE — ED Provider Notes (Signed)
[facility] EMERGENCY DEPARTMENT Provider Note   CSN: [id number] Arrival date & time: 09/20/22  2135     History  Chief Complaint  Patient presents with   Homeless    Nathan Norton is a 41 y.o. male.  41 y.o male with a PMH DM, TBI, HTN presents to the ED with multiple complaints including bilateral feet pain and hunger. Patient endorsing pain to both feet which are described as a tingling sensation however this subsided while he arrived to a bed in the ED. He also reports getting wet from the rain and would like some milk. He was seen in the ED less than 24 hours ago for the same complaint. He denies any other complaints.   The history is provided by the patient.       Home Medications Prior to Admission medications   Medication Sig Start Date End Date Taking? Authorizing Provider  acetaminophen (TYLENOL 8 HOUR) 650 MG CR tablet Take 1 tablet (650 mg total) by mouth every 8 (eight) hours as needed for pain. 06/19/22   Petrucelli, Pleas Koch, PA-C  clotrimazole (LOTRIMIN) 1 % cream Apply to affected area 2 times daily 02/19/22   Placido Sou, PA-C  loratadine (CLARITIN) 10 MG tablet Take 1 tablet (10 mg total) by mouth daily as needed for allergies or rhinitis. 06/19/22   Petrucelli, Samantha R, PA-C      Allergies    Shellfish allergy    Review of Systems   Review of Systems  Constitutional:  Negative for fever.  Respiratory:  Negative for shortness of breath.   Cardiovascular:  Negative for chest pain.  Musculoskeletal:  Positive for arthralgias.    Physical Exam Updated Vital Signs BP 111/85   Pulse 66   Temp 97.8 F (36.6 C)   Resp 16   Wt 99 kg   SpO2 98%   BMI 29.60 kg/m  Physical Exam Vitals and nursing note reviewed.  Constitutional:      Appearance: Normal appearance.  HENT:     Head: Normocephalic and atraumatic.     Mouth/Throat:     Mouth: Mucous membranes are moist.  Cardiovascular:     Rate and Rhythm: Normal rate.   Pulmonary:     Effort: Pulmonary effort is normal.  Abdominal:     General: Abdomen is flat.  Musculoskeletal:     Cervical back: Neck supple.  Skin:    General: Skin is warm and dry.  Neurological:     Mental Status: He is alert and oriented to person, place, and time.     ED Results / Procedures / Treatments   Labs (all labs ordered are listed, but only abnormal results are displayed) Labs Reviewed - No data to display  EKG None  Radiology No results found.  Procedures Procedures    Medications Ordered in ED Medications  acetaminophen (TYLENOL) tablet 1,000 mg (has no administration in time range)    ED Course/ Medical Decision Making/ A&P                           Medical Decision Making Risk OTC drugs.   Patient here with multiple complaints, seen in the ED less than 24 hours ago. He reports pain along bilateral feet described as tingling sensation. No trauma, no back pain. No fevers.  Patient is sleeping in hallway bed and would also like some food. Vitals are stable, no acute condition that warrants further workup. Patient discharged.  Portions of this note were generated with [employer]. Dictation errors may occur despite best attempts at proofreading.  Final Clinical Impression(s) / ED Diagnoses Final diagnoses:  Homelessness    Rx / DC Orders ED Discharge Orders     None         Janeece Fitting, PA-C 09/21/22 2951    Orpah Greek, MD 09/21/22 [phone]

## 2022-09-21 NOTE — ED Notes (Signed)
Patient c/o bilateral feet and hand pain ,

## 2022-09-21 NOTE — ED Notes (Signed)
Reviewed discharge instructions with patient. Follow-up care reviewed. Patient verbalized understanding. Patient A&Ox4, VSS, and ambulatory with steady gait upon discharge.   Pt provided w/ a drink, bag lunch and bus pass upon departure.

## 2022-09-23 ENCOUNTER — Encounter (HOSPITAL_COMMUNITY): Payer: Self-pay | Admitting: *Deleted

## 2022-09-23 ENCOUNTER — Other Ambulatory Visit: Payer: Self-pay

## 2022-09-23 ENCOUNTER — Emergency Department (HOSPITAL_COMMUNITY)
Admission: EM | Admit: 2022-09-23 | Discharge: 2022-09-23 | Discharge status: Against Medical Advice | Payer: Medicare (Managed Care) | Attending: Emergency Medicine | Admitting: Emergency Medicine

## 2022-09-23 DIAGNOSIS — Z5321 Procedure and treatment not carried out due to patient leaving prior to being seen by health care provider: Secondary | ICD-10-CM | POA: Insufficient documentation

## 2022-09-23 DIAGNOSIS — X58XXXA Exposure to other specified factors, initial encounter: Secondary | ICD-10-CM | POA: Insufficient documentation

## 2022-09-23 DIAGNOSIS — M25561 Pain in right knee: Secondary | ICD-10-CM | POA: Diagnosis not present

## 2022-09-23 DIAGNOSIS — T730XXA Starvation, initial encounter: Secondary | ICD-10-CM | POA: Insufficient documentation

## 2022-09-23 NOTE — ED Triage Notes (Signed)
Right knee pain, 8/10. Also his left knee is hurting.

## 2022-09-23 NOTE — ED Notes (Signed)
PATIENT REFUSED VITAL SIGNS

## 2022-09-23 NOTE — ED Notes (Signed)
Called pt back to triage for rapid d/c, pt did not answer. Sort NT's and RN stated they saw him go outside and leave a while ago. Moving pt OTF.

## 2022-09-24 ENCOUNTER — Encounter (HOSPITAL_COMMUNITY): Payer: Self-pay | Admitting: Emergency Medicine

## 2022-09-24 ENCOUNTER — Other Ambulatory Visit: Payer: Self-pay

## 2022-09-24 ENCOUNTER — Emergency Department (HOSPITAL_COMMUNITY)
Admission: EM | Admit: 2022-09-24 | Discharge: 2022-09-24 | Disposition: A | Discharge status: Home / Self Care | Payer: Medicare (Managed Care) | Attending: Student | Admitting: Student

## 2022-09-24 ENCOUNTER — Emergency Department (HOSPITAL_COMMUNITY)
Admission: EM | Admit: 2022-09-24 | Discharge: 2022-09-24 | Disposition: A | Discharge status: Home / Self Care | Payer: Medicare (Managed Care) | Source: Home / Self Care | Attending: Emergency Medicine | Admitting: Emergency Medicine

## 2022-09-24 DIAGNOSIS — R21 Rash and other nonspecific skin eruption: Secondary | ICD-10-CM | POA: Diagnosis present

## 2022-09-24 DIAGNOSIS — T730XXA Starvation, initial encounter: Secondary | ICD-10-CM

## 2022-09-24 MED ORDER — DIPHENHYDRAMINE HCL 25 MG PO CAPS
25.0000 mg | ORAL_CAPSULE | Freq: Once | ORAL | Status: AC
Start: 2022-09-24 — End: 2022-09-24
  Administered 2022-09-24: 25 mg via ORAL
  Filled 2022-09-24: qty 1

## 2022-09-24 NOTE — Discharge Instructions (Signed)
Can apply topical hydrocortisone cream.  Episode and Benadryl for itching.

## 2022-09-24 NOTE — ED Triage Notes (Signed)
Patient has rash to umbilicus he would like looked at.

## 2022-09-24 NOTE — ED Triage Notes (Signed)
Pt c/o rash to mid abd below umbilicus

## 2022-09-24 NOTE — ED Provider Notes (Signed)
[facility] EMERGENCY DEPARTMENT Provider Note   CSN: [id number] Arrival date & time: 09/24/22  2038     History  No chief complaint on file.   Stanislaus Kaltenbach is a 41 y.o. male.  HPI 41 y.o. male presents today he has a sore spot on his belly from where his belt has been rubbing.  Unsure if he was bitten by an insect..     Patient well-known to this department with frequent ED visits, homelessness, history of TBI.  Denies any medical concerns at this time  Patient was seen earlier today for same.    Home Medications Prior to Admission medications   Medication Sig Start Date End Date Taking? Authorizing Provider  acetaminophen (TYLENOL 8 HOUR) 650 MG CR tablet Take 1 tablet (650 mg total) by mouth every 8 (eight) hours as needed for pain. 06/19/22   Petrucelli, Pleas Koch, PA-C  clotrimazole (LOTRIMIN) 1 % cream Apply to affected area 2 times daily 02/19/22   Placido Sou, PA-C  loratadine (CLARITIN) 10 MG tablet Take 1 tablet (10 mg total) by mouth daily as needed for allergies or rhinitis. 06/19/22   Petrucelli, Pleas Koch, PA-C      Allergies    Shellfish allergy    Review of Systems   Review of Systems Please refer to hpi Physical Exam Updated Vital Signs BP 136/86 (BP Location: Right Arm)   Pulse 76   Temp 98.9 F (37.2 C) (Oral)   Resp 18   SpO2 100%  Physical Exam Vitals and nursing note reviewed.  Constitutional:      General: He is not in acute distress.    Appearance: He is well-developed.  HENT:     Head: Normocephalic and atraumatic.  Eyes:     General: No scleral icterus.       Right eye: No discharge.        Left eye: No discharge.  Pulmonary:     Effort: No respiratory distress.     Comments: Patient speaking complete sentences. Musculoskeletal:        General: Normal range of motion.     Cervical back: Normal range of motion.  Skin:    Coloration: Skin is not pale.     Comments: Patient with area of dry skin under the  bellybutton.  There is minimal erythema.  No induration or warmth.  No drainage.  No other rashes appreciated.  Neurological:     Mental Status: He is alert.  Psychiatric:        Behavior: Behavior normal.        Thought Content: Thought content normal.        Judgment: Judgment normal.     ED Results / Procedures / Treatments   Labs (all labs ordered are listed, but only abnormal results are displayed) Labs Reviewed - No data to display  EKG None  Radiology No results found.  Procedures Procedures    Medications Ordered in ED Medications - No data to display  ED Course/ Medical Decision Making/ A&P                           Medical Decision Making  Patient presents for rash.  Seen earlier today for same.  Reports rash is pruritic.  Possible contact dermatitis.  Could also consider fungal etiology.  Patient encouraged to use of topical creams.  Encouraged to use Pepcid and Benadryl for itching.  Discussed return precautions.  Final Clinical Impression(s) / ED Diagnoses Final diagnoses:  Rash    Rx / DC Orders ED Discharge Orders     None         Aaron Edelman 09/24/22 2132    Carmin Muskrat, MD 09/24/22 2219

## 2022-09-24 NOTE — ED Provider Notes (Signed)
[facility] EMERGENCY DEPARTMENT Provider Note   CSN: [id number] Arrival date & time: 09/23/22  2344     History  Chief Complaint  Patient presents with   Rash    Nathan Norton is a 41 y.o. male presents to the ER with concern for feeling hungry, requesting a bus pass, and today he has a sore spot on his belly from where his belt has been rubbing.  Also requesting clean socks.  Patient well-known to this department with frequent ED visits, homelessness, history of TBI.  Denies any medical concerns at this time  HPI     Home Medications Prior to Admission medications   Medication Sig Start Date End Date Taking? Authorizing Provider  acetaminophen (TYLENOL 8 HOUR) 650 MG CR tablet Take 1 tablet (650 mg total) by mouth every 8 (eight) hours as needed for pain. 06/19/22   Petrucelli, Glynda Jaeger, PA-C  clotrimazole (LOTRIMIN) 1 % cream Apply to affected area 2 times daily 02/19/22   Rayna Sexton, PA-C  loratadine (CLARITIN) 10 MG tablet Take 1 tablet (10 mg total) by mouth daily as needed for allergies or rhinitis. 06/19/22   Petrucelli, Glynda Jaeger, PA-C      Allergies    Shellfish allergy    Review of Systems   Review of Systems  Skin:        Skin irritation on lower abdomen where belt rubs    Physical Exam Updated Vital Signs BP 121/84 (BP Location: Right Arm)   Pulse 86   Temp 98.4 F (36.9 C) (Oral)   Resp 18   Ht 6' (1.829 m)   Wt 98.9 kg   SpO2 97%   BMI 29.57 kg/m  Physical Exam Vitals and nursing note reviewed.  Constitutional:      Appearance: He is not ill-appearing or toxic-appearing.  HENT:     Head: Normocephalic and atraumatic.     Mouth/Throat:     Mouth: Mucous membranes are moist.     Pharynx: No oropharyngeal exudate or posterior oropharyngeal erythema.  Eyes:     General:        Right eye: No discharge.        Left eye: No discharge.     Conjunctiva/sclera: Conjunctivae normal.  Cardiovascular:     Rate and Rhythm:  Normal rate and regular rhythm.     Pulses: Normal pulses.     Heart sounds: Normal heart sounds. No murmur heard. Pulmonary:     Effort: Pulmonary effort is normal. No respiratory distress.     Breath sounds: Normal breath sounds. No wheezing or rales.  Abdominal:     General: There is no distension.     Palpations: Abdomen is soft.     Tenderness: There is no abdominal tenderness.  Musculoskeletal:        General: No deformity.     Cervical back: Neck supple.  Skin:    General: Skin is warm and dry.     Capillary Refill: Capillary refill takes less than 2 seconds.       Neurological:     Mental Status: He is alert. Mental status is at baseline.  Psychiatric:        Mood and Affect: Mood normal.     ED Results / Procedures / Treatments   Labs (all labs ordered are listed, but only abnormal results are displayed) Labs Reviewed - No data to display  EKG None  Radiology No results found.  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  Vital signs normal intake.  Patient without medical concerns evening.  Food provided in the ED.  Well-appearing without abnormal cardiopulmonary exam.  No further work-up warranted in the ED at this time.   Nathan Norton voiced understanding of his medical evaluation and treatment plan. Each of their questions answered to their expressed satisfaction.  Return precautions were given.  Patient is well-appearing, stable, and was discharged in good condition.  This chart was dictated using voice recognition software, Dragon. Despite the best efforts of this provider to proofread and correct errors, errors may still occur which can change documentation meaning.   Final Clinical Impression(s) /  ED Diagnoses Final diagnoses:  None    Rx / DC Orders ED Discharge Orders     None         Sherrilee Gilles 09/24/22 0050    Tilden Fossa, MD 09/24/22 [phone]

## 2022-09-26 ENCOUNTER — Emergency Department (HOSPITAL_COMMUNITY)
Admission: EM | Admit: 2022-09-26 | Discharge: 2022-09-26 | Disposition: A | Discharge status: Home / Self Care | Payer: Medicare (Managed Care) | Source: Home / Self Care | Attending: Emergency Medicine | Admitting: Emergency Medicine

## 2022-09-26 ENCOUNTER — Encounter (HOSPITAL_COMMUNITY): Payer: Self-pay | Admitting: Emergency Medicine

## 2022-09-26 ENCOUNTER — Other Ambulatory Visit: Payer: Self-pay

## 2022-09-26 ENCOUNTER — Emergency Department (HOSPITAL_COMMUNITY)
Admission: EM | Admit: 2022-09-26 | Discharge: 2022-09-26 | Discharge status: Against Medical Advice | Payer: Medicare (Managed Care) | Attending: Emergency Medicine | Admitting: Emergency Medicine

## 2022-09-26 DIAGNOSIS — R21 Rash and other nonspecific skin eruption: Secondary | ICD-10-CM | POA: Insufficient documentation

## 2022-09-26 DIAGNOSIS — Z5900 Homelessness unspecified: Secondary | ICD-10-CM

## 2022-09-26 DIAGNOSIS — Z5321 Procedure and treatment not carried out due to patient leaving prior to being seen by health care provider: Secondary | ICD-10-CM | POA: Insufficient documentation

## 2022-09-26 NOTE — ED Triage Notes (Addendum)
Pt. Stated, I was sent here from [facility] for my medication for my rash and I need Tylenol for body pain. Pt is homeless "sometimes" I have a rash on my stomach or maybe other places I gues from the grass.

## 2022-09-26 NOTE — ED Notes (Signed)
PATIENT REFUSED VITALSIGNS X3 WHEN HE WAS ASK

## 2022-09-26 NOTE — ED Triage Notes (Signed)
Pt reported to ED requesting vital sign check, food and socks. No other complaints at this time.

## 2022-09-26 NOTE — ED Provider Notes (Signed)
  [facility] Provider Note   CSN: [id number] Arrival date & time: 09/26/22  1949     History  Chief Complaint  Patient presents with   Homeless    Nathan Norton is a 41 y.o. male.  The history is provided by the patient and medical records.   41 y.o. M presenting to the ED requesting VS check, food, and dry socks.  Patient well known to this facility for same.  No other acute complaints.  Home Medications Prior to Admission medications   Medication Sig Start Date End Date Taking? Authorizing Provider  acetaminophen (TYLENOL 8 HOUR) 650 MG CR tablet Take 1 tablet (650 mg total) by mouth every 8 (eight) hours as needed for pain. 06/19/22   Petrucelli, Glynda Jaeger, PA-C  clotrimazole (LOTRIMIN) 1 % cream Apply to affected area 2 times daily 02/19/22   Rayna Sexton, PA-C  loratadine (CLARITIN) 10 MG tablet Take 1 tablet (10 mg total) by mouth daily as needed for allergies or rhinitis. 06/19/22   Petrucelli, Glynda Jaeger, PA-C      Allergies    Shellfish allergy    Review of Systems   Review of Systems  Constitutional:        Homeless  All other systems reviewed and are negative.   Physical Exam Updated Vital Signs BP 122/82   Pulse 69   Temp 98.2 F (36.8 C) (Oral)   Resp 17   SpO2 98%   Physical Exam Vitals and nursing note reviewed.  Constitutional:      Appearance: He is well-developed.  HENT:     Head: Normocephalic and atraumatic.  Eyes:     Conjunctiva/sclera: Conjunctivae normal.     Pupils: Pupils are equal, round, and reactive to light.  Cardiovascular:     Rate and Rhythm: Normal rate and regular rhythm.     Heart sounds: Normal heart sounds.  Pulmonary:     Effort: Pulmonary effort is normal.     Breath sounds: Normal breath sounds.  Abdominal:     General: Bowel sounds are normal.     Palpations: Abdomen is soft.  Musculoskeletal:        General: Normal range of motion.     Cervical back: Normal range of  motion.  Skin:    General: Skin is warm and dry.  Neurological:     Mental Status: He is alert and oriented to person, place, and time.     ED Results / Procedures / Treatments   Labs (all labs ordered are listed, but only abnormal results are displayed) Labs Reviewed - No data to display  EKG None  Radiology No results found.  Procedures Procedures    Medications Ordered in ED Medications - No data to display  ED Course/ Medical Decision Making/ A&P                           Medical Decision Making  41 year old male presenting to the ED requesting socks, food, and vital sign check.  No acute complaints.  He appears at his baseline.  Supplies given, stable for discharge.  Final Clinical Impression(s) / ED Diagnoses Final diagnoses:  Homeless    Rx / DC Orders ED Discharge Orders     None         Larene Pickett, PA-C 09/27/22 0000    Ripley Fraise, MD 09/27/22 [phone]

## 2022-09-28 ENCOUNTER — Other Ambulatory Visit: Payer: Self-pay

## 2022-09-28 ENCOUNTER — Emergency Department (HOSPITAL_COMMUNITY): Payer: Medicare (Managed Care)

## 2022-09-28 ENCOUNTER — Encounter (HOSPITAL_COMMUNITY): Payer: Self-pay | Admitting: Emergency Medicine

## 2022-09-28 ENCOUNTER — Emergency Department (HOSPITAL_COMMUNITY)
Admission: EM | Admit: 2022-09-28 | Discharge: 2022-09-28 | Discharge status: Against Medical Advice | Payer: Medicare (Managed Care) | Attending: Emergency Medicine | Admitting: Emergency Medicine

## 2022-09-28 DIAGNOSIS — S61411A Laceration without foreign body of right hand, initial encounter: Secondary | ICD-10-CM | POA: Diagnosis not present

## 2022-09-28 DIAGNOSIS — S6991XA Unspecified injury of right wrist, hand and finger(s), initial encounter: Secondary | ICD-10-CM | POA: Diagnosis present

## 2022-09-28 DIAGNOSIS — Z5321 Procedure and treatment not carried out due to patient leaving prior to being seen by health care provider: Secondary | ICD-10-CM | POA: Insufficient documentation

## 2022-09-28 NOTE — ED Notes (Signed)
Pt seen getting up out of w/c and leaving the ED.
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
## 2022-09-29 ENCOUNTER — Encounter (HOSPITAL_COMMUNITY): Payer: Self-pay | Admitting: Emergency Medicine

## 2022-09-29 ENCOUNTER — Emergency Department (HOSPITAL_COMMUNITY)
Admission: EM | Admit: 2022-09-29 | Discharge: 2022-09-29 | Disposition: A | Discharge status: Home / Self Care | Payer: Medicare (Managed Care) | Attending: Emergency Medicine | Admitting: Emergency Medicine

## 2022-09-29 DIAGNOSIS — Y9301 Activity, walking, marching and hiking: Secondary | ICD-10-CM | POA: Diagnosis not present

## 2022-09-29 DIAGNOSIS — M79671 Pain in right foot: Secondary | ICD-10-CM | POA: Diagnosis present

## 2022-09-29 MED ORDER — ACETAMINOPHEN 500 MG PO TABS
1000.0000 mg | ORAL_TABLET | Freq: Once | ORAL | Status: AC
  Administered 2022-09-29: 1000 mg via ORAL
  Filled 2022-09-29: qty 2
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
## 2022-09-29 NOTE — ED Provider Triage Note (Signed)
Emergency Medicine Provider Triage Evaluation Note  Nathan Norton , a 41 y.o. male  was evaluated in triage.  Pt complains of sore muscles.  Patient well-known to this department.  Patient requesting Tylenol, a meal, and multiple bus passes so that he may "get around town".  Review of Systems  Positive: As above Negative: As above  Physical Exam  BP 105/65 (BP Location: Right Arm)   Pulse (!) 102   Temp 98.6 F (37 C)   Resp 16   SpO2 100%  Gen:   Awake, no distress   Resp:  Normal effort  MSK:   Moves extremities without difficulty  Other:    Medical Decision Making  Medically screening exam initiated at 5:25 PM.  Appropriate orders placed.  Nathan Norton was informed that the remainder of the evaluation will be completed by another provider, this initial triage assessment does not replace that evaluation, and the importance of remaining in the ED until their evaluation is complete.     Dorothyann Peng, PA-C 09/29/22 1726

## 2022-09-29 NOTE — ED Triage Notes (Signed)
Patient here requesting tylenol, food, and multiple bus passes so that he can get around.

## 2022-09-30 ENCOUNTER — Encounter (HOSPITAL_COMMUNITY): Payer: Self-pay | Admitting: Emergency Medicine

## 2022-09-30 ENCOUNTER — Emergency Department (HOSPITAL_COMMUNITY)
Admission: EM | Admit: 2022-09-30 | Discharge: 2022-09-30 | Disposition: A | Discharge status: Home / Self Care | Payer: Medicare (Managed Care) | Attending: Emergency Medicine | Admitting: Emergency Medicine

## 2022-09-30 ENCOUNTER — Emergency Department (HOSPITAL_COMMUNITY)
Admission: EM | Admit: 2022-09-30 | Discharge: 2022-10-01 | Discharge status: Against Medical Advice | Payer: Medicare (Managed Care)

## 2022-09-30 DIAGNOSIS — Z760 Encounter for issue of repeat prescription: Secondary | ICD-10-CM | POA: Insufficient documentation

## 2022-09-30 MED ORDER — CLOTRIMAZOLE 1 % EX CREA: TOPICAL_CREAM | CUTANEOUS | 0 refills | Status: DC

## 2022-09-30 NOTE — ED Triage Notes (Signed)
Pt BIB [facility] for ETOH

## 2022-09-30 NOTE — ED Provider Notes (Signed)
  [facility] Emergency Department Provider Note MRN:  [id number]  Arrival date & time: 09/30/22     Chief Complaint   Itchy Foot   History of Present Illness   Nathan Norton is a 41 y.o. year-old male presents to the ED with chief complaint of medication refill.  States that he needs his lotrimin refilled.  He also asks for some food and drink.  Denies any other complaints.  History provided by patient.   Review of Systems  Pertinent positive and negative review of systems noted in HPI.    Physical Exam   Vitals:   09/30/22 0203  BP: 111/77  Pulse: 95  Resp: 16  Temp: 98.7 F (37.1 C)  SpO2: 99%    CONSTITUTIONAL:  well-appearing, NAD NEURO:  Alert and oriented x 3, CN 3-12 grossly intact EYES:  eyes equal and reactive ENT/NECK:  Supple, no stridor  CARDIO:  appears well-perfused  PULM:  No respiratory distress,  GI/GU:  non-distended,  MSK/SPINE:  No gross deformities, no edema, moves all extremities  SKIN:  no rash, atraumatic   *Additional and/or pertinent findings included in MDM below  Diagnostic and Interventional Summary    EKG Interpretation  Date/Time:    Ventricular Rate:    PR Interval:    QRS Duration:   QT Interval:    QTC Calculation:   R Axis:     Text Interpretation:         Labs Reviewed - No data to display  No orders to display    Medications - No data to display   Procedures  /  Critical Care Procedures  ED Course and Medical Decision Making  I have reviewed the triage vital signs, the nursing notes, and pertinent available records from the EMR.  Social Determinants Affecting Complexity of Care: Patient is homelessness.   ED Course:    Medical Decision Making    Consultants: No consultations were needed in caring for this patient.   Treatment and Plan: Emergency department workup does not suggest an emergent condition requiring admission or immediate intervention beyond  what has been  performed at this time. The patient is safe for discharge and has  been instructed to return immediately for worsening symptoms, change in  symptoms or any other concerns    Final Clinical Impressions(s) / ED Diagnoses     ICD-10-CM   1. Medication refill  Z76.0       ED Discharge Orders          Ordered    clotrimazole (LOTRIMIN) 1 % cream        09/30/22 0208              Discharge Instructions Discussed with and Provided to Patient:   Discharge Instructions   None      Delaine Lame 09/30/22 4315    Palumbo, April, MD 09/30/22 0221

## 2022-09-30 NOTE — ED Triage Notes (Signed)
Patient requesting Lotrimin for itchy feet.

## 2022-09-30 NOTE — ED Notes (Signed)
PT called X3 for triage. PT not in lobby

## 2022-10-02 ENCOUNTER — Other Ambulatory Visit: Payer: Self-pay

## 2022-10-02 ENCOUNTER — Emergency Department (HOSPITAL_COMMUNITY)
Admission: EM | Admit: 2022-10-02 | Discharge: 2022-10-02 | Disposition: A | Discharge status: Home / Self Care | Payer: Medicare (Managed Care) | Attending: Emergency Medicine | Admitting: Emergency Medicine

## 2022-10-02 ENCOUNTER — Encounter (HOSPITAL_COMMUNITY): Payer: Self-pay

## 2022-10-02 DIAGNOSIS — Q046 Congenital cerebral cysts: Secondary | ICD-10-CM | POA: Insufficient documentation

## 2022-10-02 DIAGNOSIS — R52 Pain, unspecified: Secondary | ICD-10-CM | POA: Diagnosis present

## 2022-10-02 DIAGNOSIS — I10 Essential (primary) hypertension: Secondary | ICD-10-CM | POA: Diagnosis not present

## 2022-10-02 DIAGNOSIS — E119 Type 2 diabetes mellitus without complications: Secondary | ICD-10-CM | POA: Insufficient documentation

## 2022-10-02 MED ORDER — ACETAMINOPHEN 500 MG PO TABS
1000.0000 mg | ORAL_TABLET | Freq: Four times a day (QID) | ORAL | Status: DC | PRN
  Administered 2022-10-02: 1000 mg via ORAL
  Filled 2022-10-02: qty 2

## 2022-10-02 NOTE — ED Triage Notes (Signed)
Patient arrived by ems with ongoing complaint of muscle spasms. Patient alert and oriented, no acute complaint/

## 2022-10-02 NOTE — ED Provider Notes (Signed)
[facility] EMERGENCY DEPARTMENT Provider Note   CSN: [id number] Arrival date & time: 10/02/22  0350     History  No chief complaint on file.   Nathan Norton is a 41 y.o. male with frequent ED visits, h/o TBI, schizoaffective disorder, HTN, DM, alcohol abuse, homelessness presents with body aches.   Patient complaints today of "body aches."  No pain in particular, just all over, worst in his knees and his feet.  Denies any trauma to the area.  Denies any fever/chills, chest pain, shortness of breath.  Requests milk.  Per chart review, patient was seen yesterday here in the ED for Lotrimin refill.  Seen on 10/1 for right foot pain, food, request for a bus pass.  Seen on 9/28 for request for food and dry socks. Frequent ED visits for similar.   HPI     Home Medications Prior to Admission medications   Medication Sig Start Date End Date Taking? Authorizing Provider  acetaminophen (TYLENOL 8 HOUR) 650 MG CR tablet Take 1 tablet (650 mg total) by mouth every 8 (eight) hours as needed for pain. 06/19/22   Petrucelli, Glynda Jaeger, PA-C  clotrimazole (LOTRIMIN) 1 % cream Apply to affected area 2 times daily 09/30/22   Montine Circle, PA-C  loratadine (CLARITIN) 10 MG tablet Take 1 tablet (10 mg total) by mouth daily as needed for allergies or rhinitis. 06/19/22   Petrucelli, Samantha R, PA-C      Allergies    Shellfish allergy    Review of Systems   Review of Systems Review of systems negative for f/c.  A 10 point review of systems was performed and is negative unless otherwise reported in HPI.  Physical Exam Updated Vital Signs BP 115/64 (BP Location: Left Arm)   Pulse 86   Temp 97.9 F (36.6 C)   Resp 17   SpO2 98%  Physical Exam General: Normal appearing male, lying in chair, sleeping.  HEENT: Sclera anicteric, MMM, trachea midline. Cardiology: RRR, no murmurs/rubs/gallops. Resp: Normal respiratory rate and effort. Abd: Soft, non-tender, non-distended.  No rebound tenderness or guarding.  MSK: No peripheral edema or signs of trauma. No specific areas of tenderness. Extremities without deformity or TTP. No cyanosis or clubbing. Skin: warm, dry. No rashes or lesions. Neuro: A&Ox4, CNs II-XII grossly intact. MAEs. Normal gait. Psych: Normal mood and affect.   ED Results / Procedures / Treatments    Procedures Procedures    Medications Ordered in ED Medications  acetaminophen (TYLENOL) tablet 1,000 mg (has no administration in time range)    ED Course/ Medical Decision Making/ A&P                          Medical Decision Making Risk OTC drugs.    Complains of all-over soreness, worst in his bilateral knees and feet, which are unremarkable on exam. Patient is ambulatory. In ambulatory patient in absence of trauma, xrays of low utility. No other symptoms, well-appearing and vitally stable, low c/f acute or life threatening cause of patient's symptoms such as bacteremia/sepsis, septic arthritis, or rhabdomyolysis.   Will treat patient's body aches with tylenol and discharge.   Patient ambulated out of ED in stable condition. Instructed to f/u within 1 week w/ [facility].  Dispo: DC           Final Clinical Impression(s) / ED Diagnoses Final diagnoses:  Body aches    Rx / DC Orders ED Discharge  Orders     None        This note was created using dictation software, which may contain spelling or grammatical errors.    Audley Hose, MD 10/02/22 [phone]

## 2022-10-03 ENCOUNTER — Other Ambulatory Visit: Payer: Self-pay

## 2022-10-03 ENCOUNTER — Emergency Department (HOSPITAL_COMMUNITY)
Admission: EM | Admit: 2022-10-03 | Discharge: 2022-10-03 | Disposition: A | Discharge status: Home / Self Care | Payer: Medicare (Managed Care) | Attending: Emergency Medicine | Admitting: Emergency Medicine

## 2022-10-03 DIAGNOSIS — Z5321 Procedure and treatment not carried out due to patient leaving prior to being seen by health care provider: Secondary | ICD-10-CM | POA: Insufficient documentation

## 2022-10-03 DIAGNOSIS — M791 Myalgia, unspecified site: Secondary | ICD-10-CM | POA: Insufficient documentation
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
## 2022-10-03 NOTE — ED Notes (Signed)
PT decided to leave AMA °

## 2022-10-05 ENCOUNTER — Other Ambulatory Visit: Payer: Self-pay

## 2022-10-05 ENCOUNTER — Encounter (HOSPITAL_COMMUNITY): Payer: Self-pay | Admitting: Emergency Medicine

## 2022-10-05 ENCOUNTER — Emergency Department (HOSPITAL_COMMUNITY)
Admission: EM | Admit: 2022-10-05 | Discharge: 2022-10-05 | Discharge status: Against Medical Advice | Payer: Medicare (Managed Care) | Attending: Emergency Medicine | Admitting: Emergency Medicine

## 2022-10-05 DIAGNOSIS — Y909 Presence of alcohol in blood, level not specified: Secondary | ICD-10-CM | POA: Diagnosis not present

## 2022-10-05 DIAGNOSIS — M62838 Other muscle spasm: Secondary | ICD-10-CM | POA: Insufficient documentation

## 2022-10-05 DIAGNOSIS — Z5321 Procedure and treatment not carried out due to patient leaving prior to being seen by health care provider: Secondary | ICD-10-CM | POA: Insufficient documentation

## 2022-10-05 DIAGNOSIS — F1090 Alcohol use, unspecified, uncomplicated: Secondary | ICD-10-CM | POA: Diagnosis not present

## 2022-10-05 NOTE — ED Notes (Signed)
Pt not seen in lobby, not responding to being called
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
## 2022-10-06 ENCOUNTER — Other Ambulatory Visit: Payer: Self-pay

## 2022-10-06 ENCOUNTER — Emergency Department (HOSPITAL_COMMUNITY)
Admission: EM | Admit: 2022-10-06 | Discharge: 2022-10-06 | Disposition: A | Discharge status: Home / Self Care | Payer: Medicare (Managed Care) | Attending: Emergency Medicine | Admitting: Emergency Medicine

## 2022-10-06 ENCOUNTER — Emergency Department (HOSPITAL_COMMUNITY)
Admission: EM | Admit: 2022-10-06 | Discharge: 2022-10-06 | Disposition: A | Discharge status: Home / Self Care | Payer: Medicare (Managed Care) | Source: Home / Self Care | Attending: Emergency Medicine | Admitting: Emergency Medicine

## 2022-10-06 ENCOUNTER — Encounter (HOSPITAL_COMMUNITY): Payer: Self-pay | Admitting: Emergency Medicine

## 2022-10-06 DIAGNOSIS — Z5900 Homelessness unspecified: Secondary | ICD-10-CM | POA: Insufficient documentation

## 2022-10-06 DIAGNOSIS — T730XXA Starvation, initial encounter: Secondary | ICD-10-CM | POA: Insufficient documentation

## 2022-10-06 DIAGNOSIS — M545 Low back pain, unspecified: Secondary | ICD-10-CM | POA: Insufficient documentation

## 2022-10-06 DIAGNOSIS — M79641 Pain in right hand: Secondary | ICD-10-CM | POA: Insufficient documentation

## 2022-10-06 DIAGNOSIS — E119 Type 2 diabetes mellitus without complications: Secondary | ICD-10-CM | POA: Insufficient documentation

## 2022-10-06 DIAGNOSIS — M79642 Pain in left hand: Secondary | ICD-10-CM | POA: Insufficient documentation

## 2022-10-06 DIAGNOSIS — G8929 Other chronic pain: Secondary | ICD-10-CM | POA: Diagnosis not present

## 2022-10-06 DIAGNOSIS — I10 Essential (primary) hypertension: Secondary | ICD-10-CM | POA: Insufficient documentation

## 2022-10-06 DIAGNOSIS — R5383 Other fatigue: Secondary | ICD-10-CM | POA: Insufficient documentation

## 2022-10-06 MED ORDER — ACETAMINOPHEN 325 MG PO TABS
650.0000 mg | ORAL_TABLET | Freq: Once | ORAL | Status: AC
  Administered 2022-10-06: 650 mg via ORAL
  Filled 2022-10-06: qty 2

## 2022-10-06 NOTE — ED Triage Notes (Signed)
Pt reported to ED requesting warm blanket, milk and sandwich bag.

## 2022-10-06 NOTE — ED Provider Notes (Signed)
[facility] EMERGENCY DEPARTMENT Provider Note   CSN: [id number] Arrival date & time: 10/06/22  2105     History  Chief Complaint  Patient presents with   Back Pain    Nathan Norton is a 41 y.o. male.   Back Pain    Patient with medical history of schizoaffective disorder bipolar type, alcohol use disorder, diabetes, hypertension, TBI, documented history of malingering and homelessness presents today due to hunger and back pain.  Patient was seen earlier today in the emergency department due to bilateral hand pain.  He checked back in after being discharged due to having low back pain.  He tells me he has been having this pain since he was 41 years old after a fall.  Is been no recent falls or injuries.  He is not having any urinary tension, fecal incontinence, or lower extremity numbness or saddle anesthesia.  No history of malignancy or previous surgeries to the lower back.  He is requesting a sandwich.  Home Medications Prior to Admission medications   Medication Sig Start Date End Date Taking? Authorizing Provider  acetaminophen (TYLENOL 8 HOUR) 650 MG CR tablet Take 1 tablet (650 mg total) by mouth every 8 (eight) hours as needed for pain. 06/19/22   Petrucelli, Pleas Koch, PA-C  clotrimazole (LOTRIMIN) 1 % cream Apply to affected area 2 times daily 09/30/22   Roxy Horseman, PA-C  loratadine (CLARITIN) 10 MG tablet Take 1 tablet (10 mg total) by mouth daily as needed for allergies or rhinitis. 06/19/22   Petrucelli, Pleas Koch, PA-C      Allergies    Shellfish allergy    Review of Systems   Review of Systems  Musculoskeletal:  Positive for back pain.    Physical Exam Updated Vital Signs BP 124/77   Pulse 97   Temp 98.5 F (36.9 C)   Resp 19   SpO2 97%  Physical Exam Vitals and nursing note reviewed. Exam conducted with a chaperone present.  Constitutional:      Appearance: Normal appearance.     Comments: Disheveled appearance  HENT:      Head: Normocephalic and atraumatic.  Eyes:     General: No scleral icterus.       Right eye: No discharge.        Left eye: No discharge.     Extraocular Movements: Extraocular movements intact.     Pupils: Pupils are equal, round, and reactive to light.  Cardiovascular:     Rate and Rhythm: Normal rate and regular rhythm.     Pulses: Normal pulses.     Heart sounds: Normal heart sounds. No murmur heard.    No friction rub. No gallop.  Pulmonary:     Effort: Pulmonary effort is normal. No respiratory distress.     Breath sounds: Normal breath sounds.  Abdominal:     General: Abdomen is flat. Bowel sounds are normal. There is no distension.     Palpations: Abdomen is soft.     Tenderness: There is no abdominal tenderness.  Musculoskeletal:        General: Tenderness present.     Comments: No midline tenderness, paraspinal tenderness to the lumbar and thoracic spine.  Skin:    General: Skin is warm and dry.     Coloration: Skin is not jaundiced.  Neurological:     Mental Status: He is alert. Mental status is at baseline.     Coordination: Coordination normal.     Comments: Cranial nerves  II through XII grossly intact.  Upper and lower extremity strength is symmetric bilaterally.  No focal deficits.     ED Results / Procedures / Treatments   Labs (all labs ordered are listed, but only abnormal results are displayed) Labs Reviewed - No data to display  EKG None  Radiology No results found.  Procedures Procedures    Medications Ordered in ED Medications  acetaminophen (TYLENOL) tablet 650 mg (650 mg Oral Given 10/06/22 2220)    ED Course/ Medical Decision Making/ A&P                           Medical Decision Making Risk OTC drugs.   Patient presents due to low back pain and reported hunger.  No signs of cauda equina on exam, no history that be suggestive of epidural abscess or new malignancy.  No recent trauma so I do not think imaging for compression fracture  be particularly helpful.  Patient's physical exam is very reassuring without any focal deficits.  There is some very mild paraspinal tenderness but no midline tenderness or palpable step-offs.  Considered imaging but given atraumatic do not think indicated.  Patient was given food and Tylenol and feels improved.  Return precaution discussed with patient, I do not think any additional work-up is indicated.         Final Clinical Impression(s) / ED Diagnoses Final diagnoses:  Hungry, initial encounter    Rx / DC Orders ED Discharge Orders     None         Sherrill Raring, Hershal Coria 10/06/22 2258    Blanchie Dessert, MD 10/10/22 [phone]

## 2022-10-06 NOTE — ED Notes (Signed)
Patient resting in bed, states has generalized pains from being cold, MD at bedside.

## 2022-10-06 NOTE — Discharge Instructions (Signed)
Your history and exam today are consistent with chronic musculoskeletal pain likely related to some of the temperature and environmental changes now.  Please rest and stay hydrated and follow-up with your primary doctor.  If any symptoms change or worsen acutely, please return to the nearest emergency department.

## 2022-10-06 NOTE — Discharge Instructions (Signed)
Take Tylenol Motrin for pain.  Follow-up with your primary care doctor.

## 2022-10-06 NOTE — ED Triage Notes (Signed)
Pt here for lower back pain.

## 2022-10-06 NOTE — ED Provider Notes (Signed)
[facility] EMERGENCY DEPARTMENT Provider Note   CSN: [id number] Arrival date & time: 10/06/22  0020     History  Chief Complaint  Patient presents with   Homeless    Nathan Norton is a 41 y.o. male.  The history is provided by the patient and medical records. No language interpreter was used.  Illness Location:  Mild hand soreness bilaterally that is chronic.  Also hungry and fatigued Severity:  Mild Onset quality:  Gradual Timing:  Constant Chronicity:  Chronic Associated symptoms: no abdominal pain, no chest pain, no congestion, no cough, no diarrhea, no fatigue, no fever, no headaches, no loss of consciousness, no myalgias, no nausea, no rash, no rhinorrhea, no shortness of breath, no vomiting and no wheezing        Home Medications Prior to Admission medications   Medication Sig Start Date End Date Taking? Authorizing Provider  acetaminophen (TYLENOL 8 HOUR) 650 MG CR tablet Take 1 tablet (650 mg total) by mouth every 8 (eight) hours as needed for pain. 06/19/22   Petrucelli, Pleas Koch, PA-C  clotrimazole (LOTRIMIN) 1 % cream Apply to affected area 2 times daily 09/30/22   Roxy Horseman, PA-C  loratadine (CLARITIN) 10 MG tablet Take 1 tablet (10 mg total) by mouth daily as needed for allergies or rhinitis. 06/19/22   Petrucelli, Samantha R, PA-C      Allergies    Shellfish allergy    Review of Systems   Review of Systems  Constitutional:  Negative for fatigue and fever.  HENT:  Negative for congestion and rhinorrhea.   Respiratory:  Negative for cough, shortness of breath and wheezing.   Cardiovascular:  Negative for chest pain.  Gastrointestinal:  Negative for abdominal pain, constipation, diarrhea, nausea and vomiting.  Genitourinary:  Negative for flank pain.  Musculoskeletal:  Negative for back pain, joint swelling, myalgias, neck pain and neck stiffness.  Skin:  Negative for rash and wound.  Neurological:  Negative for dizziness, loss of  consciousness, light-headedness and headaches.  All other systems reviewed and are negative.   Physical Exam Updated Vital Signs BP 121/76 (BP Location: Left Arm)   Pulse 78   Temp 97.9 F (36.6 C) (Oral)   Resp 18   SpO2 99%  Physical Exam Vitals and nursing note reviewed.  Constitutional:      General: He is not in acute distress.    Appearance: He is well-developed. He is not ill-appearing, toxic-appearing or diaphoretic.  HENT:     Head: Normocephalic and atraumatic.     Nose: Nose normal.     Mouth/Throat:     Mouth: Mucous membranes are moist.  Eyes:     Extraocular Movements: Extraocular movements intact.     Conjunctiva/sclera: Conjunctivae normal.     Pupils: Pupils are equal, round, and reactive to light.  Cardiovascular:     Rate and Rhythm: Normal rate and regular rhythm.     Heart sounds: No murmur heard. Pulmonary:     Effort: Pulmonary effort is normal. No respiratory distress.     Breath sounds: Normal breath sounds. No wheezing, rhonchi or rales.  Chest:     Chest wall: No tenderness.  Abdominal:     General: Abdomen is flat.     Palpations: Abdomen is soft.     Tenderness: There is no abdominal tenderness. There is no right CVA tenderness, left CVA tenderness, guarding or rebound.  Musculoskeletal:        General: No swelling or tenderness.  Cervical back: Neck supple. No tenderness.     Right lower leg: No edema.     Left lower leg: No edema.  Skin:    General: Skin is warm and dry.     Capillary Refill: Capillary refill takes less than 2 seconds.     Findings: No erythema.     Comments: Healing abrasions on both hands from previous scrapes.  No new trauma reported.  Nontender.  No evidence of infection or cellulitis.  Neurological:     General: No focal deficit present.     Mental Status: He is alert.  Psychiatric:        Mood and Affect: Mood normal.     ED Results / Procedures / Treatments   Labs (all labs ordered are listed, but only  abnormal results are displayed) Labs Reviewed - No data to display  EKG None  Radiology No results found.  Procedures Procedures    Medications Ordered in ED Medications  acetaminophen (TYLENOL) tablet 650 mg (650 mg Oral Given 10/06/22 0856)    ED Course/ Medical Decision Making/ A&P                           Medical Decision Making Risk OTC drugs.    Nathan Norton is a 41 y.o. male with a past medical history significant for schizoaffective disorder, diabetes, hypertension, alcohol abuse, and homelessness who frequently comes the emergency department who presents with some chronic bilateral hand aching and hunger.  According to patient, for the last "a while" he has had some soreness in his arms and hands that he does think is related to the season changes and temperature changes causing joint soreness.  He was requesting some Tylenol.  He denies any acute new trauma.  He otherwise denies new fevers, chills, congestion, cough, chest pain, shortness of breath, or other complaints.  He is resting comfortably.  Of note, he has been in the emergency department for approximately 8 and half hours prior to my evaluation.  Patient reportedly told triage that he needed a warm blanket and milk and a sandwich when he came in due to the cold.  On my exam, lungs clear and chest nontender.  Abdomen nontender.  Has some abrasions on his hands that are well-healing and had intact sensation and strength and pulses.  No evidence of acute injury and no evidence of acute cellulitis on my exam.  Exam otherwise unremarkable.  Pupils are symmetric and reactive and patient denies evidence of head injury.  Patient well-appearing.  We will give patient food and drink and a dose of Tylenol for his joint soreness.  Given his otherwise well appearance and similarity to prior presentations without evidence of acute abnormalities, we feel he is safe for discharge home.  After p.o. challenge, anticipate  discharge.   9:34 AM Patient was able to tolerate p.o. challenge and got his Tylenol.  We will discharge home for outpatient follow-up.  Patient agrees with this plan.          Final Clinical Impression(s) / ED Diagnoses Final diagnoses:  Chronic hand pain, unspecified laterality   Clinical Impression: 1. Chronic hand pain, unspecified laterality     Disposition: Discharge  Condition: Good  I have discussed the results, Dx and Tx plan with the pt(& family if present). He/she/they expressed understanding and agree(s) with the plan. Discharge instructions discussed at great length. Strict return precautions discussed and pt &/or family have verbalized understanding  of the instructions. No further questions at time of discharge.    New Prescriptions   No medications on file    Follow Up: [place] [address] [place] [address] [phone] Schedule an appointment as soon as possible for a visit    [place] [address] [id number] mc [place]        Brexlee Heberlein, Gwenyth Allegra, MD 10/06/22 [phone]

## 2022-10-08 ENCOUNTER — Encounter (HOSPITAL_COMMUNITY): Payer: Self-pay | Admitting: *Deleted

## 2022-10-08 ENCOUNTER — Other Ambulatory Visit: Payer: Self-pay

## 2022-10-08 ENCOUNTER — Emergency Department (HOSPITAL_COMMUNITY)
Admission: EM | Admit: 2022-10-08 | Discharge: 2022-10-09 | Discharge status: Against Medical Advice | Payer: Medicare (Managed Care) | Source: Home / Self Care

## 2022-10-08 ENCOUNTER — Emergency Department (HOSPITAL_COMMUNITY)
Admission: EM | Admit: 2022-10-08 | Discharge: 2022-10-08 | Disposition: A | Discharge status: Home / Self Care | Payer: Medicare (Managed Care) | Attending: Emergency Medicine | Admitting: Emergency Medicine

## 2022-10-08 DIAGNOSIS — M25569 Pain in unspecified knee: Secondary | ICD-10-CM | POA: Insufficient documentation

## 2022-10-08 DIAGNOSIS — F10929 Alcohol use, unspecified with intoxication, unspecified: Secondary | ICD-10-CM | POA: Insufficient documentation

## 2022-10-08 DIAGNOSIS — M79643 Pain in unspecified hand: Secondary | ICD-10-CM | POA: Diagnosis not present

## 2022-10-08 DIAGNOSIS — Z5321 Procedure and treatment not carried out due to patient leaving prior to being seen by health care provider: Secondary | ICD-10-CM | POA: Insufficient documentation

## 2022-10-08 DIAGNOSIS — R202 Paresthesia of skin: Secondary | ICD-10-CM | POA: Insufficient documentation

## 2022-10-08 NOTE — ED Triage Notes (Signed)
Pt arrives via [facility] for knee and hand pain. VSS.

## 2022-10-08 NOTE — ED Triage Notes (Signed)
Pt arrives via [facility] from [employer], called by PD [place] [employer] [place]. ETOH. Pt has been drinking "Pirate water"116/58, cbg 163.

## 2022-10-08 NOTE — ED Triage Notes (Signed)
Pt initially says that he is hurting all the way to the tip of his toes and then says he has bilateral foot tingling "all my life".

## 2022-10-08 NOTE — ED Provider Triage Note (Signed)
  Emergency Medicine Provider Triage Evaluation Note  MRN:  [id number]  Arrival date & time: 10/08/22    Medically screening exam initiated at 11:34 PM.   CC:   Alcohol Intoxication   HPI:  Nathan Norton is a 41 y.o. year-old male presents to the ED with chief complaint of ETOH intoxication.  Brought in because he was lying down in the road.  States that he has been drinking "pirate water."  History provided by patient. ROS:  -As included in HPI PE:   Vitals:   10/08/22 2327  BP: (!) 119/96  Pulse: 81  Resp: 20  Temp: 97.9 F (36.6 C)  SpO2: 99%    Non-toxic appearing No respiratory distress Intoxicated ambulatory MDM:  Based on signs and symptoms, ETOH intoxication is highest on my differential.   Patient was informed that the remainder of the evaluation will be completed by another provider, this initial triage assessment does not replace that evaluation, and the importance of remaining in the ED until their evaluation is complete.    Montine Circle, PA-C 10/08/22 2336

## 2022-10-08 NOTE — ED Provider Triage Note (Signed)
Emergency Medicine Provider Triage Evaluation Note  Nathan Norton , a 41 y.o. male  was evaluated in triage.  Pt complains of tingling in his feet bilaterally, going on all of his life, has no other complaints, symptoms remain unchanged..  Review of Systems  Positive: Paresthesias, Negative: Weakness, headaches  Physical Exam  BP 115/79 (BP Location: Right Arm)   Pulse 81   Temp 97.8 F (36.6 C) (Oral)   Resp 17   SpO2 98%  Gen:   Awake, no distress   Resp:  Normal effort  MSK:   Moves extremities without difficulty  Other:  Cranial nerves II through XII grossly intact no difficulty with word finding following two-step commands no Weakness present.  Medical Decision Making  Medically screening exam initiated at 1:44 AM.  Appropriate orders placed.  Nathan Norton was informed that the remainder of the evaluation will be completed by another provider, this initial triage assessment does not replace that evaluation, and the importance of remaining in the ED until their evaluation is complete.     Marcello Fennel, PA-C 10/08/22 0145

## 2022-10-09 NOTE — ED Notes (Signed)
Called patient several times for a room patient didn't answer

## 2022-10-09 NOTE — ED Notes (Signed)
Called patient for vitals patient didn't answer 

## 2022-10-10 ENCOUNTER — Other Ambulatory Visit: Payer: Self-pay

## 2022-10-10 ENCOUNTER — Emergency Department (HOSPITAL_COMMUNITY)
Admission: EM | Admit: 2022-10-10 | Discharge: 2022-10-10 | Disposition: A | Discharge status: Home / Self Care | Payer: Medicare (Managed Care) | Attending: Emergency Medicine | Admitting: Emergency Medicine

## 2022-10-10 ENCOUNTER — Encounter (HOSPITAL_COMMUNITY): Payer: Self-pay | Admitting: Emergency Medicine

## 2022-10-10 DIAGNOSIS — Z711 Person with feared health complaint in whom no diagnosis is made: Secondary | ICD-10-CM | POA: Diagnosis not present

## 2022-10-10 DIAGNOSIS — Z5900 Homelessness unspecified: Secondary | ICD-10-CM | POA: Diagnosis present

## 2022-10-10 DIAGNOSIS — Z5321 Procedure and treatment not carried out due to patient leaving prior to being seen by health care provider: Secondary | ICD-10-CM | POA: Diagnosis not present

## 2022-10-10 NOTE — ED Notes (Signed)
PATIENT WAS TRIAGE AND SENT TO LOBBY ,AND SLEPT AND LEFT AMA

## 2022-10-10 NOTE — ED Notes (Addendum)
Pt refused to come to triage

## 2022-10-10 NOTE — ED Triage Notes (Signed)
Pt reported to ED with no chief complaint. Pt requesting coke and sandwich bag. No distress noted at time of triage.

## 2022-10-11 ENCOUNTER — Emergency Department (HOSPITAL_COMMUNITY)
Admission: EM | Admit: 2022-10-11 | Discharge: 2022-10-11 | Disposition: A | Discharge status: Home / Self Care | Payer: Medicare (Managed Care) | Attending: Emergency Medicine | Admitting: Emergency Medicine

## 2022-10-11 ENCOUNTER — Encounter (HOSPITAL_COMMUNITY): Payer: Self-pay | Admitting: Emergency Medicine

## 2022-10-11 DIAGNOSIS — I10 Essential (primary) hypertension: Secondary | ICD-10-CM | POA: Diagnosis not present

## 2022-10-11 DIAGNOSIS — M79671 Pain in right foot: Secondary | ICD-10-CM | POA: Diagnosis not present

## 2022-10-11 DIAGNOSIS — M25561 Pain in right knee: Secondary | ICD-10-CM

## 2022-10-11 DIAGNOSIS — M79672 Pain in left foot: Secondary | ICD-10-CM | POA: Diagnosis not present

## 2022-10-11 DIAGNOSIS — E119 Type 2 diabetes mellitus without complications: Secondary | ICD-10-CM | POA: Insufficient documentation

## 2022-10-11 LAB — CBG MONITORING, ED: Glucose-Capillary: 102 mg/dL — ABNORMAL HIGH (ref 70–99)

## 2022-10-11 MED ORDER — ACETAMINOPHEN 325 MG PO TABS
650.0000 mg | ORAL_TABLET | Freq: Once | ORAL | Status: AC
  Administered 2022-10-11: 650 mg via ORAL
  Filled 2022-10-11: qty 2

## 2022-10-11 NOTE — ED Provider Notes (Signed)
[facility] EMERGENCY DEPARTMENT Provider Note   CSN: [id number] Arrival date & time: 10/11/22  0447     History  Chief Complaint  Patient presents with   Toe Pain    Nathan Norton is a 41 y.o. male.  Patient is a 41 year old male with a past medical history of hypertension, diabetes, schizoaffective, homelessness, and alcohol use disorder presenting to the emergency department with right knee pain and bilateral foot pain.  Patient states he has had a pins-and-needles sensation in his bilateral feet for the last 2 days.  He states that he has also had some pain in his right knee.  He denies any trauma or falls.  He states he has been ambulating frequently recently.  He denies any fevers or chills, numbness or weakness.  States that he was seen by his primary doctor but is unsure what they told him to do about his foot pain.  The history is provided by the patient.  Toe Pain       Home Medications Prior to Admission medications   Medication Sig Start Date End Date Taking? Authorizing Provider  acetaminophen (TYLENOL 8 HOUR) 650 MG CR tablet Take 1 tablet (650 mg total) by mouth every 8 (eight) hours as needed for pain. 06/19/22   Petrucelli, Glynda Jaeger, PA-C  clotrimazole (LOTRIMIN) 1 % cream Apply to affected area 2 times daily 09/30/22   Montine Circle, PA-C  loratadine (CLARITIN) 10 MG tablet Take 1 tablet (10 mg total) by mouth daily as needed for allergies or rhinitis. 06/19/22   Petrucelli, Samantha R, PA-C      Allergies    Shellfish allergy    Review of Systems   Review of Systems  Physical Exam Updated Vital Signs BP (!) 128/94   Pulse 86   Temp (!) 97.4 F (36.3 C) (Oral)   Resp 15   SpO2 100%  Physical Exam Vitals and nursing note reviewed.  Constitutional:      General: He is not in acute distress.    Appearance: Normal appearance.  HENT:     Head: Normocephalic and atraumatic.     Nose: Nose normal.     Mouth/Throat:     Mouth:  Mucous membranes are moist.     Pharynx: Oropharynx is clear.  Eyes:     Extraocular Movements: Extraocular movements intact.     Conjunctiva/sclera: Conjunctivae normal.  Cardiovascular:     Rate and Rhythm: Normal rate and regular rhythm.     Pulses: Normal pulses.     Heart sounds: Normal heart sounds.  Pulmonary:     Effort: Pulmonary effort is normal.     Breath sounds: Normal breath sounds.  Abdominal:     General: Abdomen is flat.     Palpations: Abdomen is soft.     Tenderness: There is no abdominal tenderness.  Musculoskeletal:        General: Normal range of motion.     Cervical back: Normal range of motion and neck supple.     Comments: No bony tenderness to bilateral lower extremities No palpable knee joint effusion bilaterally Full ROM of bilateral knees and ankles   Skin:    General: Skin is warm and dry.     Findings: No lesion or rash.  Neurological:     General: No focal deficit present.     Mental Status: He is alert and oriented to person, place, and time.     Sensory: No sensory deficit.  Motor: No weakness.  Psychiatric:        Mood and Affect: Mood normal.        Behavior: Behavior normal.     ED Results / Procedures / Treatments   Labs (all labs ordered are listed, but only abnormal results are displayed) Labs Reviewed  CBG MONITORING, ED - Abnormal; Notable for the following components:      Result Value   Glucose-Capillary 102 (*)    All other components within normal limits    EKG None  Radiology No results found.  Procedures Procedures    Medications Ordered in ED Medications  acetaminophen (TYLENOL) tablet 650 mg (650 mg Oral Given 10/11/22 1129)    ED Course/ Medical Decision Making/ A&P Clinical Course as of 10/11/22 1144  Fri Oct 11, 2022  1137 Accucheck within normal range. He is stable for discharge with PCP and podiatry follow up. [VK]    Clinical Course User Index [VK] Rexford Maus, DO                            Medical Decision Making This patient presents to the ED with chief complaint(s) of bilateral foot pain and knee pain with pertinent past medical history of retention, diabetes, active, homeless alcohol use disorder which further complicates the presenting complaint. The complaint involves an extensive differential diagnosis and also carries with it a high risk of complications and morbidity.    The differential diagnosis includes neuropathy secondary to diabetes, fracture or dislocation unlikely as no bony tenderness, possible muscle strain or ligamentous injury though no significant knee joint effusion and no knee joint laxity, no evidence of any rash, erythema or warmth making cellulitis, abscess, septic joint unlikely  Additional history obtained: Additional history obtained from N/A Records reviewed prior ED records  ED Course and Reassessment: Patient has no obvious bony deformities or bony tenderness on exam making fracture dislocation unlikely and no signs of any rashes or infections of his feet or knee.  He is a diabetic and Accu-Chek will be performed as he may have diabetic neuropathy causing the pins-and-needles in his feet.  Was given Tylenol for pain.  Independent labs interpretation:  The following labs were independently interpreted: Accu-Chek within normal range  Independent visualization of imaging: N/A  Consultation: - Consulted or discussed management/test interpretation w/ external professional: N/A  Consideration for admission or further workup: Patient is stable for discharge home with primary care follow-up.  He will be given podiatry if needed Social Determinants of health: Homelessness, alcohol use disorder    Risk OTC drugs.          Final Clinical Impression(s) / ED Diagnoses Final diagnoses:  Foot pain, bilateral  Acute pain of right knee    Rx / DC Orders ED Discharge Orders     None         Rexford Maus, DO 10/11/22  1144

## 2022-10-11 NOTE — ED Notes (Signed)
All discharge instructions including follow up care reviewed with patient and patient verbalized understanding of same. Patient stable and ambulatory at time of discharge.  

## 2022-10-11 NOTE — Discharge Instructions (Signed)
You were seen in the emergency department for your foot and knee pain.  He had no signs of any broken bones, infection or rashes.  Your pain could be related to diabetic neuropathy which causes a pins-and-needles type of pain.  You can follow-up in the primary care clinic to have your symptoms rechecked or follow-up with podiatry who is a foot specialist as needed.  You can take Tylenol as needed for pain.  You should return to the emergency department if you are unable to walk, you have significant redness and you are unable to bend your knee, you have fevers, or if you have any other new or concerning symptoms.

## 2022-10-11 NOTE — ED Provider Triage Note (Signed)
Emergency Medicine Provider Triage Evaluation Note  Nathan Norton , a 41 y.o. male  was evaluated in triage.  Pt complains of toe pain.  States "my toes have tendonitis girl".  Denies injury/trauma.  Review of Systems  Positive: Toe pain Negative: fever  Physical Exam  BP (!) 129/96 (BP Location: Right Arm)   Pulse 85   Temp (!) 97.4 F (36.3 C) (Oral)   Resp 18   SpO2 99%  Gen:   Awake, no distress   Resp:  Normal effort  MSK:   Moves extremities without difficulty  Other:  Sleeping in wheelchair, NAD  Medical Decision Making  Medically screening exam initiated at 4:51 AM.  Appropriate orders placed.  Nathan Norton was informed that the remainder of the evaluation will be completed by another provider, this initial triage assessment does not replace that evaluation, and the importance of remaining in the ED until their evaluation is complete.  Toe pain.   Nathan Pickett, PA-C 10/11/22 0451

## 2022-10-11 NOTE — ED Triage Notes (Signed)
[place] reports pain at bilateral toes today .

## 2022-10-13 ENCOUNTER — Emergency Department (HOSPITAL_COMMUNITY)
Admission: EM | Admit: 2022-10-13 | Discharge: 2022-10-13 | Disposition: A | Discharge status: Home / Self Care | Payer: Medicare (Managed Care) | Attending: Emergency Medicine | Admitting: Emergency Medicine

## 2022-10-13 ENCOUNTER — Other Ambulatory Visit: Payer: Self-pay

## 2022-10-13 ENCOUNTER — Emergency Department (HOSPITAL_COMMUNITY)
Admission: EM | Admit: 2022-10-13 | Discharge: 2022-10-13 | Disposition: A | Discharge status: Home / Self Care | Payer: Medicare (Managed Care) | Source: Home / Self Care | Attending: Emergency Medicine | Admitting: Emergency Medicine

## 2022-10-13 ENCOUNTER — Encounter (HOSPITAL_COMMUNITY): Payer: Self-pay | Admitting: Emergency Medicine

## 2022-10-13 DIAGNOSIS — I10 Essential (primary) hypertension: Secondary | ICD-10-CM | POA: Insufficient documentation

## 2022-10-13 DIAGNOSIS — T730XXA Starvation, initial encounter: Secondary | ICD-10-CM | POA: Insufficient documentation

## 2022-10-13 DIAGNOSIS — X58XXXA Exposure to other specified factors, initial encounter: Secondary | ICD-10-CM | POA: Insufficient documentation

## 2022-10-13 DIAGNOSIS — M7918 Myalgia, other site: Secondary | ICD-10-CM | POA: Diagnosis present

## 2022-10-13 DIAGNOSIS — E119 Type 2 diabetes mellitus without complications: Secondary | ICD-10-CM | POA: Diagnosis not present

## 2022-10-13 DIAGNOSIS — R52 Pain, unspecified: Secondary | ICD-10-CM

## 2022-10-13 MED ORDER — ACETAMINOPHEN 325 MG PO TABS
650.0000 mg | ORAL_TABLET | ORAL | Status: AC
  Administered 2022-10-13: 650 mg via ORAL
  Filled 2022-10-13: qty 2

## 2022-10-13 NOTE — ED Provider Notes (Signed)
[facility] EMERGENCY DEPARTMENT Provider Note   CSN: [id number] Arrival date & time: 10/13/22  0934     History  Chief Complaint  Patient presents with   Generalized Body Aches    Nathan Norton is a 41 y.o. male.  HPI Patient is a 41 year old male with past medical history significant for HTN, DM 2, schizoaffective disorder, homelessness, alcohol use presented emergency room today with complaints of generalized body aches.  When asked specifically where he is hurting he states "everywhere "  Denies any fevers or chills cough congestion lightheadedness dizziness chest pain difficulty breathing.  States that his symptoms began today and been constant.  He states that he has been taking Tylenol but it seems that he has taken none today.    Home Medications Prior to Admission medications   Medication Sig Start Date End Date Taking? Authorizing Provider  acetaminophen (TYLENOL 8 HOUR) 650 MG CR tablet Take 1 tablet (650 mg total) by mouth every 8 (eight) hours as needed for pain. 06/19/22   Petrucelli, Glynda Jaeger, PA-C  clotrimazole (LOTRIMIN) 1 % cream Apply to affected area 2 times daily 09/30/22   Montine Circle, PA-C  loratadine (CLARITIN) 10 MG tablet Take 1 tablet (10 mg total) by mouth daily as needed for allergies or rhinitis. 06/19/22   Petrucelli, Glynda Jaeger, PA-C      Allergies    Shellfish allergy    Review of Systems   Review of Systems  Physical Exam Updated Vital Signs BP 124/88   Pulse 79   Temp 97.6 F (36.4 C)   Resp 14   Ht 6\' 2"  (1.88 m)   Wt 87.1 kg   SpO2 100%   BMI 24.65 kg/m  Physical Exam Vitals and nursing note reviewed.  Constitutional:      General: He is not in acute distress. HENT:     Head: Normocephalic and atraumatic.     Nose: Nose normal.  Eyes:     General: No scleral icterus. Cardiovascular:     Rate and Rhythm: Normal rate and regular rhythm.     Pulses: Normal pulses.     Heart sounds: Normal heart  sounds.  Pulmonary:     Effort: Pulmonary effort is normal. No respiratory distress.     Breath sounds: No wheezing.  Abdominal:     Palpations: Abdomen is soft.     Tenderness: There is no abdominal tenderness.  Musculoskeletal:     Cervical back: Normal range of motion.     Right lower leg: No edema.     Left lower leg: No edema.  Skin:    General: Skin is warm and dry.     Capillary Refill: Capillary refill takes less than 2 seconds.  Neurological:     Mental Status: He is alert. Mental status is at baseline.  Psychiatric:        Mood and Affect: Mood normal.        Behavior: Behavior normal.     ED Results / Procedures / Treatments   Labs (all labs ordered are listed, but only abnormal results are displayed) Labs Reviewed - No data to display  EKG None  Radiology No results found.  Procedures Procedures    Medications Ordered in ED Medications  acetaminophen (TYLENOL) tablet 650 mg (has no administration in time range)    ED Course/ Medical Decision Making/ A&P  Medical Decision Making Risk OTC drugs.   Patient is a 41 year old male with past medical history significant for HTN, DM 2, schizoaffective disorder, homelessness, alcohol use presented emergency room today with complaints of generalized body aches.  When asked specifically where he is hurting he states "everywhere "  Denies any fevers or chills cough congestion lightheadedness dizziness chest pain difficulty breathing.  States that his symptoms began today and been constant.  He states that he has been taking Tylenol but it seems that he has taken none today.  Patient of reassuring physical exam and history without any significant red flags.  Vital signs normal here in the ER.  Will discharge home with recommendations to follow-up with PCP.  Given his homeless status provided him with information for PCP follow up. Return precautions.     Final Clinical Impression(s) /  ED Diagnoses Final diagnoses:  Body aches    Rx / DC Orders ED Discharge Orders     None         Tedd Sias, [place] 10/13/22 1813    Isla Pence, MD 10/13/22 1928

## 2022-10-13 NOTE — ED Notes (Signed)
Pt states he has been having body aches for the past few days. Pt has been taking Tylenol and other medication to assist with the pain. Pt states it helps some then medication wears off.

## 2022-10-13 NOTE — ED Triage Notes (Signed)
Pt states he is "hungry and tired."  No other complaints, gave RN [place] and ginger ale order.

## 2022-10-13 NOTE — ED Triage Notes (Signed)
Patient BIB [facility] at a [employer] [place] off of [address] for generalized body aches that started this morning. Patient endorses ETOH use this morning, is alert and in no apparent distress at this time.

## 2022-10-13 NOTE — ED Provider Notes (Signed)
[facility] EMERGENCY DEPARTMENT Provider Note   CSN: [id number] Arrival date & time: 10/13/22  2008     History  Chief Complaint  Patient presents with   "hungry and tired."    Nathan Norton is a 41 y.o. male.  The history is provided by the patient and medical records. No language interpreter was used.     41 year old male significant history of schizophrenia, traumatic brain injury, hypertension, homelessness, presenting requesting for food.  States he is waiting for his benefit to kick in so he can get some food.  No other complaint.  He was seen in ED earlier yesterday and was evaluated for general body aches.  That is since resolved.  No nausea vomiting or diarrhea  Home Medications Prior to Admission medications   Medication Sig Start Date End Date Taking? Authorizing Provider  acetaminophen (TYLENOL 8 HOUR) 650 MG CR tablet Take 1 tablet (650 mg total) by mouth every 8 (eight) hours as needed for pain. 06/19/22   Petrucelli, Glynda Jaeger, PA-C  clotrimazole (LOTRIMIN) 1 % cream Apply to affected area 2 times daily 09/30/22   Montine Circle, PA-C  loratadine (CLARITIN) 10 MG tablet Take 1 tablet (10 mg total) by mouth daily as needed for allergies or rhinitis. 06/19/22   Petrucelli, Glynda Jaeger, PA-C      Allergies    Shellfish allergy    Review of Systems   Review of Systems  All other systems reviewed and are negative.   Physical Exam Updated Vital Signs BP (!) 130/98 (BP Location: Right Wrist)   Pulse 87   Temp 99.1 F (37.3 C)   Resp 18   SpO2 95%  Physical Exam Vitals and nursing note reviewed.  Constitutional:      General: He is not in acute distress.    Appearance: He is well-developed.  HENT:     Head: Atraumatic.  Eyes:     Conjunctiva/sclera: Conjunctivae normal.  Musculoskeletal:     Cervical back: Neck supple.  Skin:    Findings: No rash.  Neurological:     Mental Status: He is alert.     ED Results / Procedures /  Treatments   Labs (all labs ordered are listed, but only abnormal results are displayed) Labs Reviewed - No data to display  EKG None  Radiology No results found.  Procedures Procedures    Medications Ordered in ED Medications - No data to display  ED Course/ Medical Decision Making/ A&P                           Medical Decision Making  BP (!) 130/98 (BP Location: Right Wrist)   Pulse 87   Temp 99.1 F (37.3 C)   Resp 18   SpO2 95%   42:55 PM 41 year old male significant history of schizophrenia, traumatic brain injury, hypertension, homelessness, presenting requesting for food.  States he is waiting for his benefit to kick in so he can get some food.  No other complaint.  He was seen in ED earlier yesterday and was evaluated for general body aches.  That is since resolved.  No nausea vomiting or diarrhea  On exam patient is resting comfortably appears to be in no acute discomfort.  I have review patient's EMR including his earlier ER visit he complains of body aches.  He does not endorse any body aches at this time.  I will provide patient with a sandwich but otherwise he  is stable to be discharged.  I have considered obtaining labs but I felt it is low yield.        Final Clinical Impression(s) / ED Diagnoses Final diagnoses:  Hungry, initial encounter    Rx / DC Orders ED Discharge Orders     None         Domenic Moras, PA-C 10/13/22 2142    Jeanell Sparrow, DO 10/13/22 2211

## 2022-10-15 ENCOUNTER — Encounter (HOSPITAL_COMMUNITY): Payer: Self-pay

## 2022-10-15 ENCOUNTER — Emergency Department (HOSPITAL_COMMUNITY)
Admission: EM | Admit: 2022-10-15 | Discharge: 2022-10-15 | Disposition: A | Discharge status: Home / Self Care | Payer: Medicare (Managed Care) | Attending: Emergency Medicine | Admitting: Emergency Medicine

## 2022-10-15 DIAGNOSIS — T730XXD Starvation, subsequent encounter: Secondary | ICD-10-CM | POA: Diagnosis not present

## 2022-10-15 DIAGNOSIS — M791 Myalgia, unspecified site: Secondary | ICD-10-CM | POA: Insufficient documentation

## 2022-10-15 DIAGNOSIS — R52 Pain, unspecified: Secondary | ICD-10-CM

## 2022-10-15 NOTE — ED Triage Notes (Signed)
Pt arrives via [facility] for generalized pain. During triage pt stating that he is hungry and has had issues with his food stamps at the [facility].

## 2022-10-15 NOTE — Discharge Instructions (Addendum)
You were seen in the ER for generalized body aches.  I've attached resources for shelters in the area and financial assistance including food stamps.

## 2022-10-15 NOTE — ED Notes (Signed)
Pt evaluated by PA Lorin R. Pt states that he is sleepy. Last alcoholic drink yesterday. Denies any illicit drug use. Pt in no obvious distress. Pt given sandwiches and discharged accordingly.

## 2022-10-16 ENCOUNTER — Other Ambulatory Visit: Payer: Self-pay

## 2022-10-16 ENCOUNTER — Emergency Department (HOSPITAL_COMMUNITY)
Admission: EM | Admit: 2022-10-16 | Discharge: 2022-10-16 | Disposition: A | Discharge status: Home / Self Care | Payer: Medicare (Managed Care) | Attending: Emergency Medicine | Admitting: Emergency Medicine

## 2022-10-16 ENCOUNTER — Emergency Department (HOSPITAL_COMMUNITY)
Admission: EM | Admit: 2022-10-16 | Discharge: 2022-10-17 | Disposition: A | Discharge status: Home / Self Care | Payer: Medicare (Managed Care) | Source: Home / Self Care

## 2022-10-16 ENCOUNTER — Encounter (HOSPITAL_COMMUNITY): Payer: Self-pay | Admitting: Emergency Medicine

## 2022-10-16 DIAGNOSIS — X58XXXA Exposure to other specified factors, initial encounter: Secondary | ICD-10-CM | POA: Diagnosis not present

## 2022-10-16 DIAGNOSIS — I10 Essential (primary) hypertension: Secondary | ICD-10-CM | POA: Insufficient documentation

## 2022-10-16 DIAGNOSIS — Z760 Encounter for issue of repeat prescription: Secondary | ICD-10-CM | POA: Insufficient documentation

## 2022-10-16 DIAGNOSIS — F209 Schizophrenia, unspecified: Secondary | ICD-10-CM | POA: Insufficient documentation

## 2022-10-16 DIAGNOSIS — T730XXA Starvation, initial encounter: Secondary | ICD-10-CM | POA: Insufficient documentation

## 2022-10-16 DIAGNOSIS — E119 Type 2 diabetes mellitus without complications: Secondary | ICD-10-CM | POA: Insufficient documentation

## 2022-10-16 DIAGNOSIS — Z5900 Homelessness unspecified: Secondary | ICD-10-CM | POA: Insufficient documentation

## 2022-10-16 DIAGNOSIS — R638 Other symptoms and signs concerning food and fluid intake: Secondary | ICD-10-CM | POA: Diagnosis present

## 2022-10-16 NOTE — Discharge Instructions (Addendum)
Return for any problem.  ?

## 2022-10-16 NOTE — ED Provider Notes (Addendum)
[facility] EMERGENCY DEPARTMENT Provider Note   CSN: [id number] Arrival date & time: 10/16/22  0152     History  Chief Complaint  Patient presents with   Hungry    Nathan Norton is a 41 y.o. male.  41 year old male with prior medical history as detailed below presents for evaluation.  Patient complains of feeling hungry.  He reports he has had issues with steady food supply in the past.  Patient was seen similar complaint yesterday.  Patient with multiple prior visits with similar complaints over the last 6 months.  The history is provided by the patient and medical records.       Home Medications Prior to Admission medications   Medication Sig Start Date End Date Taking? Authorizing Provider  acetaminophen (TYLENOL 8 HOUR) 650 MG CR tablet Take 1 tablet (650 mg total) by mouth every 8 (eight) hours as needed for pain. 06/19/22   Petrucelli, Glynda Jaeger, PA-C  clotrimazole (LOTRIMIN) 1 % cream Apply to affected area 2 times daily 09/30/22   Montine Circle, PA-C  loratadine (CLARITIN) 10 MG tablet Take 1 tablet (10 mg total) by mouth daily as needed for allergies or rhinitis. 06/19/22   Petrucelli, Glynda Jaeger, PA-C      Allergies    Shellfish allergy    Review of Systems   Review of Systems  All other systems reviewed and are negative.   Physical Exam Updated Vital Signs BP 121/81   Pulse 96   Temp 99.5 F (37.5 C)   Resp 17   SpO2 98%  Physical Exam Vitals and nursing note reviewed.  Constitutional:      General: He is not in acute distress.    Appearance: He is well-developed.  HENT:     Head: Normocephalic and atraumatic.  Eyes:     Conjunctiva/sclera: Conjunctivae normal.  Cardiovascular:     Rate and Rhythm: Normal rate and regular rhythm.     Heart sounds: No murmur heard. Pulmonary:     Effort: Pulmonary effort is normal. No respiratory distress.     Breath sounds: Normal breath sounds.  Abdominal:     Palpations: Abdomen is  soft.     Tenderness: There is no abdominal tenderness.  Musculoskeletal:        General: No swelling.     Cervical back: Neck supple.  Skin:    General: Skin is warm and dry.     Capillary Refill: Capillary refill takes less than 2 seconds.  Neurological:     Mental Status: He is alert.  Psychiatric:        Mood and Affect: Mood normal.     ED Results / Procedures / Treatments   Labs (all labs ordered are listed, but only abnormal results are displayed) Labs Reviewed - No data to display  EKG None  Radiology No results found.  Procedures Procedures    Medications Ordered in ED Medications - No data to display  ED Course/ Medical Decision Making/ A&P                           Medical Decision Making   Medical Screen Complete  This patient presented to the ED with complaint of hungry, homeless.  This complaint involves an extensive number of treatment options. The initial differential diagnosis includes, but is not limited to, homelessness  This presentation is: Chronic, Self-Limited, Previously Undiagnosed, Uncertain Prognosis, Complicated, Systemic Symptoms, and Threat to Life/Bodily Function  Patient with longstanding history of homelessness and multiple other social issues.  Patient's visit today is consistent with prior evaluations over the last 6 months.  Patient has been seen multiple times in the last several days with similar complaint.  Patient without indication of emergent issue that requires work-up here in the ED.  Additionally patient refuses basic ED evaluation including EKG.  He primarily seems to be interested in getting something to eat.  The patient appears to be comfortable and safe to discharge.  Patient does have significant knowledge of resources to help with his homelessness and the [place] area.  Importance of close follow-up is stressed.  Strict return precautions given and understood. Additional history obtained: External  records from outside sources obtained and reviewed including prior ED visits and prior Inpatient records.    Problem List / ED Course:  Hungry   Reevaluation:  After the interventions noted above, I reevaluated the patient and found that they have: improved   Social Determinants of Health:  Homeless   Disposition:  After consideration of the diagnostic results and the patients response to treatment, I feel that the patent would benefit from close outpatient follow-up.          Final Clinical Impression(s) / ED Diagnoses Final diagnoses:  Hungry, initial encounter    Rx / DC Orders ED Discharge Orders     None         Wynetta Fines, MD 10/16/22 1223    Wynetta Fines, MD 10/16/22 1253

## 2022-10-16 NOTE — ED Provider Notes (Signed)
[facility] EMERGENCY DEPARTMENT Provider Note   CSN: [id number] Arrival date & time: 10/15/22  1659     History  Chief Complaint  Patient presents with   Generalized Body Aches    Nathan Norton is a 41 y.o. male with history of schizoaffective disorder, TBI, hypertension, diabetes, alcohol abuse, homelessness who presents the emergency department complaining of generalized body aches and hunger.  He states that he has had issues with his food stamps at the [facility].  Reports last alcoholic drink yesterday, denies any illicit drug use.  Cannot localize his body aches other than "all over".  States that he is very sleepy.  Of note, patient well known to this department with 30 ER visits in the past 6 months for similar complaints  HPI     Home Medications Prior to Admission medications   Medication Sig Start Date End Date Taking? Authorizing Provider  acetaminophen (TYLENOL 8 HOUR) 650 MG CR tablet Take 1 tablet (650 mg total) by mouth every 8 (eight) hours as needed for pain. 06/19/22   Petrucelli, Glynda Jaeger, PA-C  clotrimazole (LOTRIMIN) 1 % cream Apply to affected area 2 times daily 09/30/22   Montine Circle, PA-C  loratadine (CLARITIN) 10 MG tablet Take 1 tablet (10 mg total) by mouth daily as needed for allergies or rhinitis. 06/19/22   Petrucelli, Glynda Jaeger, PA-C      Allergies    Shellfish allergy    Review of Systems   Review of Systems  Musculoskeletal:  Positive for myalgias.  All other systems reviewed and are negative.   Physical Exam Updated Vital Signs BP 133/78 (BP Location: Left Arm)   Pulse 98   Temp 97.6 F (36.4 C)   Resp 16   SpO2 99%  Physical Exam Vitals and nursing note reviewed.  Constitutional:      Appearance: Normal appearance.     Comments: Appears tired  HENT:     Head: Normocephalic and atraumatic.  Eyes:     Conjunctiva/sclera: Conjunctivae normal.  Pulmonary:     Effort: Pulmonary effort is normal. No respiratory  distress.  Skin:    General: Skin is warm and dry.  Neurological:     Mental Status: He is alert.  Psychiatric:        Mood and Affect: Mood normal.        Behavior: Behavior normal.     ED Results / Procedures / Treatments   Labs (all labs ordered are listed, but only abnormal results are displayed) Labs Reviewed - No data to display  EKG None  Radiology No results found.  Procedures Procedures    Medications Ordered in ED Medications - No data to display  ED Course/ Medical Decision Making/ A&P                           Medical Decision Making  This patient is a 41 y.o. male who presents to the ED for concern of generalized body aches and hunger. Cannot specify further.  Past Medical History / Social History / Additional history: Chart reviewed. Pertinent results include: schizoaffective disorder, TBI, hypertension, diabetes, alcohol abuse, homelessness  Of note, patient has 44 ER visits in the past 6 months for similar complaints.  Physical Exam: Physical exam performed. The pertinent findings include: Normal vital signs. In no acute distress. Appears tired.   Medications / Treatment: Given multiple sandwiches   Disposition: After consideration of the diagnostic results and the  patients response to treatment, I feel that emergency department workup does not suggest an emergent condition requiring admission or immediate intervention beyond what has been performed at this time. The plan is: discharge to home with food and financial aid resources. The patient is safe for discharge and has been instructed to return immediately for worsening symptoms, change in symptoms or any other concerns.         Final Clinical Impression(s) / ED Diagnoses Final diagnoses:  Generalized body aches  Hungry, subsequent encounter    Rx / DC Orders ED Discharge Orders     None      Portions of this report may have been transcribed using voice recognition software.  Every effort was made to ensure accuracy; however, inadvertent computerized transcription errors may be present.    Jeanella Flattery 10/16/22 2836    [name], MD 10/19/22 [phone]

## 2022-10-16 NOTE — ED Notes (Signed)
Pt refused EKG, nurse was notfied

## 2022-10-16 NOTE — ED Triage Notes (Signed)
Patient arrived with EMS with multiple complaints : hungry and feeling tired for several days .

## 2022-10-16 NOTE — ED Notes (Signed)
AVS provided to and discussed with patient. Pt verbalizes understanding of discharge instructions and denies any questions or concerns at this time. Pt ambulated out of department independently with steady gait.  

## 2022-10-17 ENCOUNTER — Other Ambulatory Visit: Payer: Self-pay

## 2022-10-17 ENCOUNTER — Encounter (HOSPITAL_COMMUNITY): Payer: Self-pay | Admitting: Emergency Medicine

## 2022-10-17 ENCOUNTER — Encounter (HOSPITAL_COMMUNITY): Payer: Self-pay

## 2022-10-17 ENCOUNTER — Emergency Department (HOSPITAL_COMMUNITY)
Admission: EM | Admit: 2022-10-17 | Discharge: 2022-10-18 | Disposition: A | Discharge status: Home / Self Care | Payer: Medicare (Managed Care) | Attending: Emergency Medicine | Admitting: Emergency Medicine

## 2022-10-17 DIAGNOSIS — E119 Type 2 diabetes mellitus without complications: Secondary | ICD-10-CM | POA: Diagnosis not present

## 2022-10-17 DIAGNOSIS — I10 Essential (primary) hypertension: Secondary | ICD-10-CM | POA: Insufficient documentation

## 2022-10-17 DIAGNOSIS — M79671 Pain in right foot: Secondary | ICD-10-CM | POA: Insufficient documentation

## 2022-10-17 DIAGNOSIS — M79672 Pain in left foot: Secondary | ICD-10-CM | POA: Insufficient documentation

## 2022-10-17 MED ORDER — ACETAMINOPHEN 325 MG PO TABS
650.0000 mg | ORAL_TABLET | Freq: Four times a day (QID) | ORAL | Status: DC | PRN
  Administered 2022-10-17: 650 mg via ORAL
  Filled 2022-10-17: qty 2

## 2022-10-17 NOTE — ED Triage Notes (Signed)
Wants some milk, cola, and ginger ale.   Also requesting tylenol/ ibuprofen prescription.

## 2022-10-17 NOTE — ED Triage Notes (Signed)
Patient requesting food and reports bilateral feet pain for several weeks , denies injury/ambulatory.

## 2022-10-17 NOTE — Discharge Instructions (Signed)
You can pick up Tylenol  at the pharmacy do not need a prescription.  Follow-up with community health and wellness  Come back to the emergency department if you develop chest pain, shortness of breath, severe abdominal pain, uncontrolled nausea, vomiting, diarrhea.

## 2022-10-17 NOTE — ED Provider Notes (Signed)
[facility] EMERGENCY DEPARTMENT Provider Note   CSN: [id number] Arrival date & time: 10/16/22  2322     History  Chief Complaint  Patient presents with   Medication Refill    Nathan Norton is a 41 y.o. male.  HPI   Medical history including schizophrenia hypertension diabetes homelessness presents with complaints of wanting a soda as well as Advil.  Patient states that he has no other complaints, he states that the [place] for his generalized bone pain states is normal for him he has no worsening pain at this time, he denies any suicidal homicidal ideations denies any hallucinations or delusions he is only complaints.  Reviewed patient's chart he is been here multiple times for similar presentations, most recently seen in the 15th and was discharged home.  Home Medications Prior to Admission medications   Medication Sig Start Date End Date Taking? Authorizing Provider  acetaminophen (TYLENOL 8 HOUR) 650 MG CR tablet Take 1 tablet (650 mg total) by mouth every 8 (eight) hours as needed for pain. 06/19/22   Petrucelli, Glynda Jaeger, PA-C  clotrimazole (LOTRIMIN) 1 % cream Apply to affected area 2 times daily 09/30/22   Montine Circle, PA-C  loratadine (CLARITIN) 10 MG tablet Take 1 tablet (10 mg total) by mouth daily as needed for allergies or rhinitis. 06/19/22   Petrucelli, Samantha R, PA-C      Allergies    Shellfish allergy    Review of Systems   Review of Systems  Constitutional:  Negative for chills and fever.  Respiratory:  Negative for shortness of breath.   Cardiovascular:  Negative for chest pain.  Gastrointestinal:  Negative for abdominal pain.  Neurological:  Negative for headaches.    Physical Exam Updated Vital Signs BP (!) 170/86   Pulse 99   Temp 98.6 F (37 C) (Oral)   Resp 16   SpO2 98%  Physical Exam Vitals and nursing note reviewed.  Constitutional:      General: He is not in acute distress.    Appearance: Normal appearance.  He is not ill-appearing or diaphoretic.  HENT:     Head: Normocephalic and atraumatic.     Nose: No congestion or rhinorrhea.  Eyes:     Conjunctiva/sclera: Conjunctivae normal.  Cardiovascular:     Rate and Rhythm: Normal rate.  Pulmonary:     Effort: Pulmonary effort is normal.  Musculoskeletal:     Cervical back: Neck supple.  Skin:    General: Skin is warm and dry.  Neurological:     Mental Status: He is alert.  Psychiatric:        Mood and Affect: Mood normal.     Comments: No suicidal homicidal ideations he is responding appropriately does not appear to be respond to internal stimuli.     ED Results / Procedures / Treatments   Labs (all labs ordered are listed, but only abnormal results are displayed) Labs Reviewed - No data to display  EKG None  Radiology No results found.  Procedures Procedures    Medications Ordered in ED Medications  acetaminophen (TYLENOL) tablet 650 mg (has no administration in time range)    ED Course/ Medical Decision Making/ A&P                           Medical Decision Making   This patient presents to the ED for concern of medication refill, this involves an extensive number of treatment options, and is  a complaint that carries with it a high risk of complications and morbidity.  The differential diagnosis includes psychiatric emergency, withdrawals,    Additional history obtained:  Additional history obtained from N/A External records from outside source obtained and reviewed including his ER notes   Co morbidities that complicate the patient evaluation  Diabetes  Social Determinants of Health:  Homeless    Lab Tests:  I Ordered, and personally interpreted labs.  The pertinent results include: N/A   Imaging Studies ordered:  I ordered imaging studies including n/a I independently visualized and interpreted imaging which showed N/A I agree with the radiologist interpretation   Cardiac Monitoring:  The  patient was maintained on a cardiac monitor.  I personally viewed and interpreted the cardiac monitored which showed an underlying rhythm of: n/a   Medicines ordered and prescription drug management:  I ordered medication including n/a I have reviewed the patients home medicines and have made adjustments as needed  Critical Interventions:  N/a   Reevaluation:  Benign physical exam agreement with discharge at this time.  Consultations Obtained:  N/A    Test Considered:  N/A    Rule out Doubt psychiatric emergency does not endorse suicidal homicidal ideations does not respond to internal stimuli.  I doubt withdrawals nontremulous on my exam vital signs are reassuring    Dispostion and problem list  After consideration of the diagnostic results and the patients response to treatment, I feel that the patent would benefit from discharge.  Medication refill-provide him with his Advil, will have him go to the pharmacy and pick up this over-the-counter follow-up with PCP for further evaluation Homeless-resources within the area for shelters, he was also provided with a blanket prior to discharge.           Final Clinical Impression(s) / ED Diagnoses Final diagnoses:  Medication refill    Rx / DC Orders ED Discharge Orders     None         Marcello Fennel, PA-C 10/17/22 0111    Palumbo, April, MD 10/17/22 [phone]

## 2022-10-18 ENCOUNTER — Emergency Department (HOSPITAL_COMMUNITY)
Admission: EM | Admit: 2022-10-18 | Discharge: 2022-10-18 | Discharge status: Against Medical Advice | Payer: Medicare (Managed Care)

## 2022-10-18 NOTE — ED Provider Notes (Signed)
[facility] EMERGENCY DEPARTMENT Provider Note   CSN: [id number] Arrival date & time: 10/17/22  2124     History  Chief Complaint  Patient presents with   Feet Pain     Nathan Norton is a 41 y.o. male.  HPI 41 year old male with a history of schizoaffective disorder, DM type II, alcohol abuse, hypertension and homelessness with frequent ER visits, 46 visits in the last 6 months presents to the ER with complaints of bilateral foot pain.  Seen here yesterday for soda and Advil.  Is requesting a sandwich today.  Bilateral foot pain has been ongoing for several weeks.  No falls or injuries.    Home Medications Prior to Admission medications   Medication Sig Start Date End Date Taking? Authorizing Provider  acetaminophen (TYLENOL 8 HOUR) 650 MG CR tablet Take 1 tablet (650 mg total) by mouth every 8 (eight) hours as needed for pain. 06/19/22   Petrucelli, Pleas Koch, PA-C  clotrimazole (LOTRIMIN) 1 % cream Apply to affected area 2 times daily 09/30/22   Roxy Horseman, PA-C  loratadine (CLARITIN) 10 MG tablet Take 1 tablet (10 mg total) by mouth daily as needed for allergies or rhinitis. 06/19/22   Petrucelli, Pleas Koch, PA-C      Allergies    Shellfish allergy    Review of Systems   Review of Systems Ten systems reviewed and are negative for acute change, except as noted in the HPI.   Physical Exam Updated Vital Signs BP (!) 147/92 (BP Location: Right Arm)   Pulse 70   Temp 98.4 F (36.9 C) (Oral)   Resp 18   SpO2 99%  Physical Exam Vitals and nursing note reviewed.  Constitutional:      General: He is not in acute distress.    Appearance: He is well-developed.  HENT:     Head: Normocephalic and atraumatic.  Eyes:     Conjunctiva/sclera: Conjunctivae normal.  Cardiovascular:     Rate and Rhythm: Normal rate and regular rhythm.     Heart sounds: No murmur heard. Pulmonary:     Effort: Pulmonary effort is normal. No respiratory distress.      Breath sounds: Normal breath sounds.  Abdominal:     Palpations: Abdomen is soft.     Tenderness: There is no abdominal tenderness.  Musculoskeletal:        General: No swelling or tenderness.     Cervical back: Neck supple.     Comments: No visible wounds, deformities to feet bilaterally, strength and sensation intact  Skin:    General: Skin is warm and dry.     Capillary Refill: Capillary refill takes less than 2 seconds.  Neurological:     Mental Status: He is alert.  Psychiatric:        Mood and Affect: Mood normal.     ED Results / Procedures / Treatments   Labs (all labs ordered are listed, but only abnormal results are displayed) Labs Reviewed - No data to display  EKG None  Radiology No results found.  Procedures Procedures    Medications Ordered in ED Medications - No data to display  ED Course/ Medical Decision Making/ A&P                           Medical Decision Making  41 year old male presenting with bilateral foot pain for weeks.  No visible trauma or falls.  No wounds.  Suspect secondary to neuropathy.  I hesitate to start him on gabapentin given history of medication noncompliance.  We will refer him to come community health and wellness.  TOC consult placed to assist.  Stable for discharge. Final Clinical Impression(s) / ED Diagnoses Final diagnoses:  Pain in both feet    Rx / DC Orders ED Discharge Orders     None         Lyndel Safe 10/18/22 7858    Quintella Reichert, MD 10/18/22 [phone]

## 2022-10-18 NOTE — Discharge Instructions (Addendum)
Please follow-up with current community health and wellness, call their phone number to schedule an appointment

## 2022-10-18 NOTE — ED Notes (Signed)
No answer for triage.

## 2022-10-20 ENCOUNTER — Emergency Department (HOSPITAL_COMMUNITY)
Admission: EM | Admit: 2022-10-20 | Discharge: 2022-10-20 | Disposition: A | Discharge status: Home / Self Care | Payer: Medicare (Managed Care) | Attending: Emergency Medicine | Admitting: Emergency Medicine

## 2022-10-20 ENCOUNTER — Other Ambulatory Visit: Payer: Self-pay

## 2022-10-20 ENCOUNTER — Encounter (HOSPITAL_COMMUNITY): Payer: Self-pay | Admitting: Emergency Medicine

## 2022-10-20 DIAGNOSIS — M79672 Pain in left foot: Secondary | ICD-10-CM | POA: Insufficient documentation

## 2022-10-20 DIAGNOSIS — M79671 Pain in right foot: Secondary | ICD-10-CM | POA: Diagnosis not present

## 2022-10-20 NOTE — ED Provider Notes (Signed)
[facility] EMERGENCY DEPARTMENT Provider Note   CSN: [id number] Arrival date & time: 10/20/22  1740     History  Chief Complaint  Patient presents with   Feet pain     Nathan Norton is a 41 y.o. male.  The history is provided by the patient and medical records.   41 y.o. M here with bilateral foot pain.  Well known to the ED for similar.  Requesting dry socks and something to eat.  Home Medications Prior to Admission medications   Medication Sig Start Date End Date Taking? Authorizing Provider  acetaminophen (TYLENOL 8 HOUR) 650 MG CR tablet Take 1 tablet (650 mg total) by mouth every 8 (eight) hours as needed for pain. 06/19/22   Petrucelli, Pleas Koch, PA-C  clotrimazole (LOTRIMIN) 1 % cream Apply to affected area 2 times daily 09/30/22   Roxy Horseman, PA-C  loratadine (CLARITIN) 10 MG tablet Take 1 tablet (10 mg total) by mouth daily as needed for allergies or rhinitis. 06/19/22   Petrucelli, Pleas Koch, PA-C      Allergies    Shellfish allergy    Review of Systems   Review of Systems  Musculoskeletal:  Positive for arthralgias.  All other systems reviewed and are negative.   Physical Exam Updated Vital Signs BP 115/69 (BP Location: Right Arm)   Pulse (!) 101   Temp 97.6 F (36.4 C) (Oral)   Resp 19   SpO2 98%   Physical Exam Vitals and nursing note reviewed.  Constitutional:      Appearance: He is well-developed.  HENT:     Head: Normocephalic and atraumatic.  Eyes:     Conjunctiva/sclera: Conjunctivae normal.     Pupils: Pupils are equal, round, and reactive to light.  Cardiovascular:     Rate and Rhythm: Normal rate and regular rhythm.     Heart sounds: Normal heart sounds.  Pulmonary:     Effort: Pulmonary effort is normal.     Breath sounds: Normal breath sounds.  Abdominal:     General: Bowel sounds are normal.     Palpations: Abdomen is soft.  Musculoskeletal:        General: Normal range of motion.     Cervical back:  Normal range of motion.     Comments: Feet with some blisters noted on sole of foot, no erythema/induration, no ulcerations  Skin:    General: Skin is warm and dry.  Neurological:     Mental Status: He is alert and oriented to person, place, and time.     ED Results / Procedures / Treatments   Labs (all labs ordered are listed, but only abnormal results are displayed) Labs Reviewed - No data to display  EKG None  Radiology No results found.  Procedures Procedures    Medications Ordered in ED Medications - No data to display  ED Course/ Medical Decision Making/ A&P                           Medical Decision Making  41 y.o. M here with bilateral foot pain.  Well known to the ED for similar.  Given dry socks and meal.  Stable for discharge.  Can return here for new concerns.  Final Clinical Impression(s) / ED Diagnoses Final diagnoses:  Pain in both feet    Rx / DC Orders ED Discharge Orders     None         Sharilyn Sites  Curt Jews 10/20/22 0093    Quintella Reichert, MD 10/20/22 [phone]

## 2022-10-20 NOTE — ED Triage Notes (Signed)
Patient reports bilateral feet pain , also requesting food and milk.

## 2022-10-21 ENCOUNTER — Emergency Department (HOSPITAL_COMMUNITY)
Admission: EM | Admit: 2022-10-21 | Discharge: 2022-10-21 | Discharge status: Against Medical Advice | Payer: Medicare (Managed Care) | Attending: Emergency Medicine | Admitting: Emergency Medicine

## 2022-10-21 ENCOUNTER — Encounter (HOSPITAL_COMMUNITY): Payer: Self-pay

## 2022-10-21 ENCOUNTER — Other Ambulatory Visit: Payer: Self-pay

## 2022-10-21 ENCOUNTER — Emergency Department (HOSPITAL_COMMUNITY)
Admission: EM | Admit: 2022-10-21 | Discharge: 2022-10-21 | Disposition: A | Discharge status: Home / Self Care | Payer: Medicare (Managed Care) | Source: Home / Self Care | Attending: Emergency Medicine | Admitting: Emergency Medicine

## 2022-10-21 ENCOUNTER — Encounter (HOSPITAL_COMMUNITY): Payer: Self-pay | Admitting: Emergency Medicine

## 2022-10-21 DIAGNOSIS — Z5329 Procedure and treatment not carried out because of patient's decision for other reasons: Secondary | ICD-10-CM | POA: Insufficient documentation

## 2022-10-21 DIAGNOSIS — Z765 Malingerer [conscious simulation]: Secondary | ICD-10-CM | POA: Insufficient documentation

## 2022-10-21 DIAGNOSIS — Z5941 Food insecurity: Secondary | ICD-10-CM | POA: Insufficient documentation

## 2022-10-21 DIAGNOSIS — M7918 Myalgia, other site: Secondary | ICD-10-CM | POA: Diagnosis present

## 2022-10-21 DIAGNOSIS — M25561 Pain in right knee: Secondary | ICD-10-CM | POA: Diagnosis not present

## 2022-10-21 DIAGNOSIS — M25562 Pain in left knee: Secondary | ICD-10-CM | POA: Insufficient documentation

## 2022-10-21 DIAGNOSIS — E119 Type 2 diabetes mellitus without complications: Secondary | ICD-10-CM | POA: Insufficient documentation

## 2022-10-21 DIAGNOSIS — Z5900 Homelessness unspecified: Secondary | ICD-10-CM | POA: Insufficient documentation

## 2022-10-21 MED ORDER — ACETAMINOPHEN 325 MG PO TABS
325.0000 mg | ORAL_TABLET | Freq: Once | ORAL | Status: AC
  Administered 2022-10-21: 325 mg via ORAL
  Filled 2022-10-21: qty 1

## 2022-10-21 MED ORDER — ACETAMINOPHEN 500 MG PO TABS
1000.0000 mg | ORAL_TABLET | Freq: Once | ORAL | Status: AC
  Administered 2022-10-21: 1000 mg via ORAL
  Filled 2022-10-21: qty 2

## 2022-10-21 NOTE — ED Provider Notes (Signed)
[facility] EMERGENCY DEPARTMENT Provider Note   CSN: [id number] Arrival date & time: 10/21/22  1028     History  Chief Complaint  Patient presents with   Homeless    Nathan Norton is a 41 y.o. male.  With PMH of housing insecurity well-known to this ED with last visit yesterday morning presenting today requesting something to eat and some Tylenol.  He was already given a refill of his Lotrimin so he says he does not need that anymore.  He has no new acute medical complaints.  He says he has some typical aches and pains from walking around all day and being outside in the cold.  He has no new acute pain, no recent falls or injuries, no fevers chills, vomiting.  HPI     Home Medications Prior to Admission medications   Medication Sig Start Date End Date Taking? Authorizing Provider  acetaminophen (TYLENOL 8 HOUR) 650 MG CR tablet Take 1 tablet (650 mg total) by mouth every 8 (eight) hours as needed for pain. 06/19/22   Petrucelli, Pleas Koch, PA-C  clotrimazole (LOTRIMIN) 1 % cream Apply to affected area 2 times daily 09/30/22   Roxy Horseman, PA-C  loratadine (CLARITIN) 10 MG tablet Take 1 tablet (10 mg total) by mouth daily as needed for allergies or rhinitis. 06/19/22   Petrucelli, Pleas Koch, PA-C      Allergies    Shellfish allergy    Review of Systems   Review of Systems  Physical Exam Updated Vital Signs BP (!) 116/93 (BP Location: Right Arm)   Pulse 89   Temp 98.5 F (36.9 C)   Resp 16   Ht 6\' 2"  (1.88 m)   Wt 87.1 kg   SpO2 99%   BMI 24.65 kg/m  Physical Exam Constitutional: Alert and oriented.  Well-appearing nontoxic in no acute distress acting appropriately Eyes: Conjunctivae are normal. ENT      Head: Normocephalic and atraumatic. Cardiovascular: S1, S2, warm and well perfused, regular rate Respiratory: Normal respiratory effort.  O2 sat 99 on RA Gastrointestinal: Soft Musculoskeletal: Normal range of motion in all extremities.       Right lower leg: No tenderness or edema.      Left lower leg: No tenderness or edema. Neurologic: Normal speech and language.  No facial droop.  Steady ambulatory gait.  Equal strength of bilateral upper and lower extremities.  Sensation grossly intact.  No gross focal neurologic deficits are appreciated. Skin: Skin is warm, dry Psychiatric: Mood and affect are normal. Speech and behavior are normal.  ED Results / Procedures / Treatments   Labs (all labs ordered are listed, but only abnormal results are displayed) Labs Reviewed - No data to display  EKG None  Radiology No results found.  Procedures Procedures    Medications Ordered in ED Medications  acetaminophen (TYLENOL) tablet 1,000 mg (has no administration in time range)    ED Course/ Medical Decision Making/ A&P                           Medical Decision Making Nathan Norton is a 41 y.o. male.  With PMH of housing insecurity well-known to this ED with last visit yesterday morning presenting today requesting something to eat and some Tylenol.   Patient has no acute medical complaints.  He is acting appropriately, is hemodynamically stable and neurologically intact.  He has had no new injuries or falls.  He does not require  any further blood work-up or imaging work-up at this time.  Tylenol and a meal provided.  He knows he can return with any new complaints.  He is safe for discharge back to community.  Risk OTC drugs.    Final Clinical Impression(s) / ED Diagnoses Final diagnoses:  None    Rx / DC Orders ED Discharge Orders     None         Elgie Congo, MD 10/21/22 1839
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
## 2022-10-21 NOTE — ED Triage Notes (Signed)
Patient comes in requesting food, more clotrimazole cream and tylenol.

## 2022-10-21 NOTE — ED Triage Notes (Signed)
Patient requesting Tylenol ,milk and food .

## 2022-10-22 DIAGNOSIS — M79673 Pain in unspecified foot: Secondary | ICD-10-CM | POA: Diagnosis not present

## 2022-10-22 DIAGNOSIS — I10 Essential (primary) hypertension: Secondary | ICD-10-CM | POA: Diagnosis not present

## 2022-10-22 DIAGNOSIS — M25562 Pain in left knee: Secondary | ICD-10-CM | POA: Diagnosis not present

## 2022-10-22 DIAGNOSIS — M549 Dorsalgia, unspecified: Secondary | ICD-10-CM | POA: Insufficient documentation

## 2022-10-22 DIAGNOSIS — M25561 Pain in right knee: Secondary | ICD-10-CM | POA: Insufficient documentation

## 2022-10-22 DIAGNOSIS — E119 Type 2 diabetes mellitus without complications: Secondary | ICD-10-CM | POA: Diagnosis not present

## 2022-10-22 DIAGNOSIS — G894 Chronic pain syndrome: Secondary | ICD-10-CM | POA: Diagnosis not present

## 2022-10-22 DIAGNOSIS — M25569 Pain in unspecified knee: Secondary | ICD-10-CM | POA: Diagnosis present

## 2022-10-22 DIAGNOSIS — Z5900 Homelessness unspecified: Secondary | ICD-10-CM | POA: Insufficient documentation

## 2022-10-22 NOTE — ED Provider Triage Note (Signed)
Emergency Medicine Provider Triage Evaluation Note  Nathan Norton , a 41 y.o. male  was evaluated in triage.  Pt complains of bilateral foot and knee pain.  He reports "same as usual".  Asked for blanket upon arrival to triage.  Did have 1 beer PTA.  Review of Systems  Positive: Foot/knee pain Negative: fever  Physical Exam  There were no vitals taken for this visit.  Gen:   Awake, no distress   Resp:  Normal effort  MSK:   Moves extremities without difficulty  Other:  Ambulatory in triage  Medical Decision Making  Medically screening exam initiated at 11:48 PM.  Appropriate orders placed.  Nathan Norton was informed that the remainder of the evaluation will be completed by another provider, this initial triage assessment does not replace that evaluation, and the importance of remaining in the ED until their evaluation is complete.

## 2022-10-22 NOTE — ED Triage Notes (Signed)
PER EMS: pt is here with c/o his chronic b/l knee and feet pain. Denies other complaints.   BP- 138/92, HR-104, 97% RA

## 2022-10-23 ENCOUNTER — Encounter (HOSPITAL_COMMUNITY): Payer: Self-pay

## 2022-10-23 ENCOUNTER — Emergency Department (HOSPITAL_COMMUNITY)
Admission: EM | Admit: 2022-10-23 | Discharge: 2022-10-23 | Disposition: A | Discharge status: Home / Self Care | Payer: Medicare (Managed Care) | Attending: Emergency Medicine | Admitting: Emergency Medicine

## 2022-10-23 ENCOUNTER — Other Ambulatory Visit: Payer: Self-pay

## 2022-10-23 DIAGNOSIS — G894 Chronic pain syndrome: Secondary | ICD-10-CM
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
## 2022-10-23 NOTE — ED Notes (Signed)
Pt refused DC VS> Pt asked for sandwich, pt in no apparent distress at this time.

## 2022-10-24 ENCOUNTER — Emergency Department (HOSPITAL_COMMUNITY)
Admission: EM | Admit: 2022-10-24 | Discharge: 2022-10-24 | Disposition: A | Discharge status: Home / Self Care | Payer: Medicare (Managed Care) | Attending: Emergency Medicine | Admitting: Emergency Medicine

## 2022-10-24 ENCOUNTER — Encounter (HOSPITAL_COMMUNITY): Payer: Self-pay | Admitting: Emergency Medicine

## 2022-10-24 ENCOUNTER — Emergency Department (HOSPITAL_COMMUNITY): Payer: Medicare (Managed Care)

## 2022-10-24 ENCOUNTER — Emergency Department (HOSPITAL_COMMUNITY)
Admission: EM | Admit: 2022-10-24 | Discharge: 2022-10-25 | Disposition: A | Discharge status: Home / Self Care | Payer: Medicare (Managed Care) | Source: Home / Self Care | Attending: Emergency Medicine | Admitting: Emergency Medicine

## 2022-10-24 ENCOUNTER — Other Ambulatory Visit: Payer: Self-pay

## 2022-10-24 DIAGNOSIS — M79671 Pain in right foot: Secondary | ICD-10-CM | POA: Insufficient documentation

## 2022-10-24 DIAGNOSIS — E119 Type 2 diabetes mellitus without complications: Secondary | ICD-10-CM | POA: Insufficient documentation

## 2022-10-24 DIAGNOSIS — Z5900 Homelessness unspecified: Secondary | ICD-10-CM | POA: Insufficient documentation

## 2022-10-24 DIAGNOSIS — M79672 Pain in left foot: Secondary | ICD-10-CM | POA: Insufficient documentation

## 2022-10-24 DIAGNOSIS — I10 Essential (primary) hypertension: Secondary | ICD-10-CM | POA: Insufficient documentation

## 2022-10-24 DIAGNOSIS — G8929 Other chronic pain: Secondary | ICD-10-CM | POA: Diagnosis not present

## 2022-10-24 NOTE — ED Notes (Addendum)
PATIENT REFUSED VITALSIGNS X3 .PATIENT CAME BACK IN LOBBY.

## 2022-10-24 NOTE — ED Notes (Signed)
PATIENT LEFT AMA 

## 2022-10-24 NOTE — ED Triage Notes (Signed)
Patient with bilateral feet pain for the last 40 years.  Patient is also intoxicated.

## 2022-10-24 NOTE — ED Triage Notes (Signed)
Pt c/o bilateral knee pain and left toe pain.

## 2022-10-24 NOTE — ED Provider Triage Note (Signed)
Emergency Medicine Provider Triage Evaluation Note  Nathan Norton , a 41 y.o. male  was evaluated in triage.  Pt complains of foot pain for his "whole life". Denies any pain at present. He has been drinking ETOH today; will not quantify the amount.  Review of Systems  Positive: As above Negative: As above  Physical Exam  BP 120/79 (BP Location: Left Arm)   Pulse 83   Temp 97.8 F (36.6 C) (Oral)   Resp 15   SpO2 100%  Gen:   Awake, no distress   Resp:  Normal effort  MSK:   Moves extremities without difficulty  Other:  Clinically intoxicated  Medical Decision Making  Medically screening exam initiated at 11:12 PM.  Appropriate orders placed.  Nathan Norton was informed that the remainder of the evaluation will be completed by another provider, this initial triage assessment does not replace that evaluation, and the importance of remaining in the ED until their evaluation is complete.  Chronic pain    Nathan Breach, PA-C 10/24/22 2313

## 2022-10-24 NOTE — ED Provider Triage Note (Signed)
Emergency Medicine Provider Triage Evaluation Note  Nathan Norton , a 41 y.o. male  was evaluated in triage.  Pt complains of pain in the left great toe and second toe going on since today he is unclear if he injured it, states he feels a sharp pain, no paresthesias or weakness, he denies any rash or swelling, states he has had pain in the past but he is concerned that he might of broken at this time.  States he does a lot of walking.  He has no other complaints..  Review of Systems  Positive: Great toe pain, second toe pain Negative: Paresthesias, weakness  Physical Exam  There were no vitals taken for this visit. Gen:   Awake, no distress   Resp:  Normal effort  MSK:   Moves extremities without difficulty  Other:    Medical Decision Making  Medically screening exam initiated at 12:38 AM.  Appropriate orders placed.  Meghan Tiemann was informed that the remainder of the evaluation will be completed by another provider, this initial triage assessment does not replace that evaluation, and the importance of remaining in the ED until their evaluation is complete.  Imaging has been ordered will need further work-up.   Marcello Fennel, PA-C 10/24/22 [phone]

## 2022-10-24 NOTE — ED Provider Notes (Addendum)
[facility] EMERGENCY DEPARTMENT Provider Note   CSN: [id number] Arrival date & time: 10/24/22  0011     History  Chief Complaint  Patient presents with   Foot Pain    Nathan Norton is a 41 y.o. male.  HPI    41 year old male comes in with chief complaint of foot pain.  Patient indicates that both of his feet are hurting, left toe specifically is giving him some issues.  He denies any specific injury.  Pain is worse with walking. No n/v/f/c.  Patient was seen in the ER with same complaint before, and has had some x-rays.  Home Medications Prior to Admission medications   Medication Sig Start Date End Date Taking? Authorizing Provider  acetaminophen (TYLENOL 8 HOUR) 650 MG CR tablet Take 1 tablet (650 mg total) by mouth every 8 (eight) hours as needed for pain. 06/19/22   Petrucelli, Glynda Jaeger, PA-C  clotrimazole (LOTRIMIN) 1 % cream Apply to affected area 2 times daily 09/30/22   Montine Circle, PA-C  loratadine (CLARITIN) 10 MG tablet Take 1 tablet (10 mg total) by mouth daily as needed for allergies or rhinitis. 06/19/22   Petrucelli, Glynda Jaeger, PA-C      Allergies    Shellfish allergy    Review of Systems   Review of Systems  Physical Exam Updated Vital Signs BP (!) 130/92 (BP Location: Right Arm)   Pulse 95   Temp 98.1 F (36.7 C) (Oral)   Resp 16   SpO2 97%  Physical Exam Vitals and nursing note reviewed.  Constitutional:      Appearance: He is well-developed.  Cardiovascular:     Rate and Rhythm: Normal rate.  Pulmonary:     Effort: Pulmonary effort is normal.  Musculoskeletal:        General: Tenderness present. No swelling or deformity.     Cervical back: Neck supple.  Skin:    General: Skin is warm.  Neurological:     Mental Status: He is alert and oriented to person, place, and time.     ED Results / Procedures / Treatments   Labs (all labs ordered are listed, but only abnormal results are displayed) Labs Reviewed - No  data to display  EKG None  Radiology DG Foot Complete Left  Result Date: 10/24/2022 CLINICAL DATA:  First and second toe pain, initial encounter EXAM: LEFT FOOT - COMPLETE 3+ VIEW COMPARISON:  12/11/2021 FINDINGS: Mild degenerative changes of the first MTP joint are seen. No acute fracture or dislocation is noted. No soft tissue abnormality is seen. IMPRESSION: No acute abnormality noted. Electronically Signed   By: Inez Catalina M.D.   On: 10/24/2022 01:00    Procedures Procedures    Medications Ordered in ED Medications - No data to display  ED Course/ Medical Decision Making/ A&P                           Medical Decision Making  41 year old patient comes in with chief complaint of toe pain.  He has no specific trauma.  On exam there is no gross deformity.  Differential diagnosis includes gout, soft tissue injury, callus, fracture.  Patient has had x-rays of his toes in the past.  X-ray of the foot was completed today.  I looked at the x-ray of the foot from today, no evidence of gross fracture.  Patient is stable for discharge.  I reviewed patient's prior imaging of the toes. Social  determinants of health: Homelessness, lack of access  Final Clinical Impression(s) / ED Diagnoses Final diagnoses:  Chronic foot pain, unspecified laterality    Rx / DC Orders ED Discharge Orders     None         Varney Biles, MD 10/24/22 [place], [place], MD 10/24/22 [phone]

## 2022-10-24 NOTE — Discharge Instructions (Addendum)
Provided is the information for [facility], and [facility].  Both of these institutions can provide you with basic primary care needs and also additional resources to help you.  You have been seen about 10 times in the emergency department in the last 10 days.  Try to utilize emergency services for emergencies only.

## 2022-10-25 ENCOUNTER — Emergency Department (HOSPITAL_COMMUNITY)
Admission: EM | Admit: 2022-10-25 | Discharge: 2022-10-25 | Disposition: A | Discharge status: Home / Self Care | Payer: Medicare (Managed Care) | Attending: Emergency Medicine | Admitting: Emergency Medicine

## 2022-10-25 DIAGNOSIS — E119 Type 2 diabetes mellitus without complications: Secondary | ICD-10-CM | POA: Diagnosis not present

## 2022-10-25 DIAGNOSIS — Z5900 Homelessness unspecified: Secondary | ICD-10-CM | POA: Diagnosis not present

## 2022-10-25 DIAGNOSIS — Z760 Encounter for issue of repeat prescription: Secondary | ICD-10-CM | POA: Diagnosis present

## 2022-10-25 DIAGNOSIS — I10 Essential (primary) hypertension: Secondary | ICD-10-CM | POA: Insufficient documentation

## 2022-10-25 MED ORDER — ACETAMINOPHEN 325 MG PO TABS: 650.0000 mg | ORAL_TABLET | Freq: Once | ORAL | Status: DC

## 2022-10-25 MED ORDER — ACETAMINOPHEN 325 MG PO TABS
650.0000 mg | ORAL_TABLET | Freq: Once | ORAL | Status: AC
  Administered 2022-10-25: 650 mg via ORAL
  Filled 2022-10-25: qty 2

## 2022-10-25 NOTE — ED Provider Notes (Signed)
[facility] EMERGENCY DEPARTMENT Provider Note   CSN: [id number] Arrival date & time: 10/24/22  2257     History  Chief Complaint  Patient presents with   Foot Pain    Nathan Norton is a 41 y.o. male with medical history of alcohol abuse, diabetes, hypertension, schizoaffective disorder, TBI, homelessness, malingering.  Patient presents to the ED for evaluation of bilateral foot pain.  The patient often presents to the ED for evaluation of bilateral foot pain.  The patient has been seen 53 times in the last 6 months for the same complaint of bilateral foot pain.  On examination, when asked the patient states he has had foot pain for the last 40 years.  The patient denies any recent trauma or event to account for increased pain.  The patient is also requesting food.  Patient was seen yesterday for left foot plain films which were unremarkable.   Foot Pain       Home Medications Prior to Admission medications   Medication Sig Start Date End Date Taking? Authorizing Provider  acetaminophen (TYLENOL 8 HOUR) 650 MG CR tablet Take 1 tablet (650 mg total) by mouth every 8 (eight) hours as needed for pain. 06/19/22   Petrucelli, Glynda Jaeger, PA-C  clotrimazole (LOTRIMIN) 1 % cream Apply to affected area 2 times daily 09/30/22   Montine Circle, PA-C  loratadine (CLARITIN) 10 MG tablet Take 1 tablet (10 mg total) by mouth daily as needed for allergies or rhinitis. 06/19/22   Petrucelli, Glynda Jaeger, PA-C      Allergies    Shellfish allergy    Review of Systems   Review of Systems  Musculoskeletal:  Positive for myalgias.  All other systems reviewed and are negative.   Physical Exam Updated Vital Signs BP 104/67 (BP Location: Left Arm)   Pulse 86   Temp 98.5 F (36.9 C)   Resp 14   SpO2 100%  Physical Exam Vitals and nursing note reviewed.  Constitutional:      General: He is not in acute distress.    Appearance: Normal appearance. He is well-developed. He  is not ill-appearing, toxic-appearing or diaphoretic.  HENT:     Head: Normocephalic and atraumatic.  Eyes:     Conjunctiva/sclera: Conjunctivae normal.  Cardiovascular:     Rate and Rhythm: Normal rate and regular rhythm.     Heart sounds: No murmur heard. Pulmonary:     Effort: Pulmonary effort is normal. No respiratory distress.     Breath sounds: Normal breath sounds.  Abdominal:     Palpations: Abdomen is soft.     Tenderness: There is no abdominal tenderness.  Musculoskeletal:        General: Tenderness present. No swelling.     Cervical back: Neck supple.     Comments: No overlying skin change to bilateral feet.  2+ DP pulse noted to bilateral feet.  Patient neurovascularly intact.  No obvious deformity.  Skin:    General: Skin is warm and dry.     Capillary Refill: Capillary refill takes less than 2 seconds.  Neurological:     Mental Status: He is alert.  Psychiatric:        Mood and Affect: Mood normal.     ED Results / Procedures / Treatments   Labs (all labs ordered are listed, but only abnormal results are displayed) Labs Reviewed - No data to display  EKG None  Radiology DG Foot Complete Left  Result Date: 10/24/2022 CLINICAL DATA:  First and second toe pain, initial encounter EXAM: LEFT FOOT - COMPLETE 3+ VIEW COMPARISON:  12/11/2021 FINDINGS: Mild degenerative changes of the first MTP joint are seen. No acute fracture or dislocation is noted. No soft tissue abnormality is seen. IMPRESSION: No acute abnormality noted. Electronically Signed   By: Inez Catalina M.D.   On: 10/24/2022 01:00    Procedures Procedures   Medications Ordered in ED Medications  acetaminophen (TYLENOL) tablet 650 mg (has no administration in time range)    ED Course/ Medical Decision Making/ A&P                           Medical Decision Making Risk OTC drugs.   41 year old male presents to the ED for evaluation.  Please see HPI for further details.  On examination the  patient is afebrile and nontachycardic.  Patient lung sounds clear bilaterally, not hypoxic.  Patient bilateral feet have no overlying skin change.  The patient is neurovascularly intact throughout his lower extremities.  The patient is a 2+ DP pulse to bilateral lower extremities.  The patient has been seen 53 times in the last 6 months for bilateral foot pain, most recently less than 24 hours ago.  Patient displayed the ability to ambulate on his feet here in the department.  The patient is also requesting food.  Patient was provided with bag lunch, water.  The patient will be discharged home and advised to follow-up with PCP which we will refer him to.  Patient discharged in stable condition back to the community.  Final Clinical Impression(s) / ED Diagnoses Final diagnoses:  Foot pain, right    Rx / DC Orders ED Discharge Orders     None         Lawana Chambers 10/25/22 1041    Varney Biles, MD 10/26/22 1435

## 2022-10-25 NOTE — ED Provider Notes (Signed)
[facility] EMERGENCY DEPARTMENT Provider Note   CSN: [id number] Arrival date & time: 10/25/22  2200    History  Request for meds  Jamarkis Branam is a 41 y.o. male history of schizophrenia, hypertension, diabetes, homelessness here for evaluation of requesting Tylenol and drink.  He has no other complaints.  He states Tylenol is for his generalized aches which are chronic.  No recent falls or injuries.  No numbness or weakness.  States he cannot afford Tylenol outpatient.  On arrival he is eating and requesting a drink.  He denies any SI, HI, AVH.  54 visits over the last 6 months, discharged this morning from this hospital after requesting Tylenol as well.  HPI     Home Medications Prior to Admission medications   Medication Sig Start Date End Date Taking? Authorizing Provider  acetaminophen (TYLENOL 8 HOUR) 650 MG CR tablet Take 1 tablet (650 mg total) by mouth every 8 (eight) hours as needed for pain. 06/19/22   Petrucelli, Pleas Koch, PA-C  clotrimazole (LOTRIMIN) 1 % cream Apply to affected area 2 times daily 09/30/22   Roxy Horseman, PA-C  loratadine (CLARITIN) 10 MG tablet Take 1 tablet (10 mg total) by mouth daily as needed for allergies or rhinitis. 06/19/22   Petrucelli, Pleas Koch, PA-C      Allergies    Shellfish allergy    Review of Systems   Review of Systems  Constitutional: Negative.   HENT: Negative.    Respiratory: Negative.    Cardiovascular: Negative.   Gastrointestinal: Negative.   Genitourinary: Negative.   Musculoskeletal: Negative.   Skin: Negative.   Neurological: Negative.   All other systems reviewed and are negative.   Physical Exam Updated Vital Signs BP 122/81   Pulse 93   Resp 18   SpO2 96%  Physical Exam Vitals and nursing note reviewed.  Constitutional:      General: He is not in acute distress.    Appearance: He is well-developed. He is not ill-appearing, toxic-appearing or diaphoretic.  HENT:     Head:  Atraumatic.  Eyes:     Pupils: Pupils are equal, round, and reactive to light.  Cardiovascular:     Rate and Rhythm: Normal rate and regular rhythm.  Pulmonary:     Effort: Pulmonary effort is normal. No respiratory distress.  Abdominal:     General: There is no distension.     Palpations: Abdomen is soft.  Musculoskeletal:        General: Normal range of motion.     Cervical back: Normal range of motion and neck supple.  Skin:    General: Skin is warm and dry.  Neurological:     General: No focal deficit present.     Mental Status: He is alert and oriented to person, place, and time.     ED Results / Procedures / Treatments   Labs (all labs ordered are listed, but only abnormal results are displayed) Labs Reviewed - No data to display  EKG None  Radiology DG Foot Complete Left  Result Date: 10/24/2022 CLINICAL DATA:  First and second toe pain, initial encounter EXAM: LEFT FOOT - COMPLETE 3+ VIEW COMPARISON:  12/11/2021 FINDINGS: Mild degenerative changes of the first MTP joint are seen. No acute fracture or dislocation is noted. No soft tissue abnormality is seen. IMPRESSION: No acute abnormality noted. Electronically Signed   By: Alcide Clever M.D.   On: 10/24/2022 01:00    Procedures Procedures    Medications  Ordered in ED Medications  acetaminophen (TYLENOL) tablet 650 mg (has no administration in time range)    ED Course/ Medical Decision Making/ A&P    41 year old history of schizoaffective, hypertension, diabetes, malingering here for evaluation requesting food, drink and Tylenol.  Patient well-known to the emergency department with 54 visits her last 6 months for similar complaints.  He denies any recent falls or injuries.  No numbness or weakness.  On my initial evaluation he is eating, requesting a ginger ale.  He states he is just here for "my Tylenol."  He was seen this morning for similar complaints.  States he cannot afford to take this  outpatient.  Patient ambulatory.  Neurovascularly intact however will not remove his shoes for further evaluation of his feet, states "they are the same as they always are." DC with symptomatic management.  Will return for new or worsening complaints.  I reviewed his imaging from yesterday of his feet which did not show any significant abnormality.  The patient has been appropriately medically screened and/or stabilized in the ED. I have low suspicion for any other emergent medical condition which would require further screening, evaluation or treatment in the ED or require inpatient management.  Patient is hemodynamically stable and in no acute distress.  Patient able to ambulate in department prior to ED.  Evaluation does not show acute pathology that would require ongoing or additional emergent interventions while in the emergency department or further inpatient treatment.  I have discussed the diagnosis with the patient and answered all questions.  Pain is been managed while in the emergency department and patient has no further complaints prior to discharge.  Patient is comfortable with plan discussed in room and is stable for discharge at this time.  I have discussed strict return precautions for returning to the emergency department.  Patient was encouraged to follow-up with PCP/specialist refer to at discharge.                           Medical Decision Making Amount and/or Complexity of Data Reviewed External Data Reviewed: radiology and notes. Radiology: independent interpretation performed. Decision-making details documented in ED Course.  Risk OTC drugs. Prescription drug management. Diagnosis or treatment significantly limited by social determinants of health.          Final Clinical Impression(s) / ED Diagnoses Final diagnoses:  Homelessness  Encounter for medication refill    Rx / DC Orders ED Discharge Orders     None         Chancellor Vanderloop A,  PA-C 10/25/22 2329    Kommor, Debe Coder, MD 10/27/22 [phone]

## 2022-10-25 NOTE — Discharge Instructions (Addendum)
Return to ED with any new concerns or symptoms Follow-up with PCP I referred you to Continue taking ibuprofen for foot pain

## 2022-10-27 ENCOUNTER — Emergency Department (HOSPITAL_COMMUNITY)
Admission: EM | Admit: 2022-10-27 | Discharge: 2022-10-28 | Disposition: A | Discharge status: Home / Self Care | Payer: Medicare (Managed Care) | Attending: Emergency Medicine | Admitting: Emergency Medicine

## 2022-10-27 ENCOUNTER — Encounter (HOSPITAL_COMMUNITY): Payer: Self-pay | Admitting: Emergency Medicine

## 2022-10-27 DIAGNOSIS — M79671 Pain in right foot: Secondary | ICD-10-CM | POA: Insufficient documentation

## 2022-10-27 DIAGNOSIS — Z5900 Homelessness unspecified: Secondary | ICD-10-CM | POA: Insufficient documentation

## 2022-10-27 NOTE — ED Triage Notes (Signed)
Pt here from the street with c/o pain all over , pt was just here yesterday

## 2022-10-28 ENCOUNTER — Emergency Department (HOSPITAL_COMMUNITY)
Admission: EM | Admit: 2022-10-28 | Discharge: 2022-10-29 | Discharge status: Against Medical Advice | Payer: Medicare (Managed Care)

## 2022-10-28 ENCOUNTER — Other Ambulatory Visit: Payer: Self-pay

## 2022-10-28 NOTE — ED Provider Notes (Signed)
[facility]EMERGENCY DEPT [facility] Emergency Department Provider Note MRN:  [id number]  Arrival date & time: 10/28/22     Chief Complaint   Homeless   History of Present Illness   Nathan Norton is a 41 y.o. year-old male presents to the ED with chief complaint of right foot pain.  He states that this is his chronic pain.  He denies new injury or trauma. Denies any other complaints other than being hungry.  History provided by patient.   Review of Systems  Pertinent positive and negative review of systems noted in HPI.    Physical Exam   Vitals:   10/27/22 2007 10/28/22 0344  BP: 118/64 128/78  Pulse: 98 98  Resp: 12 17  Temp: 98.4 F (36.9 C) 98.7 F (37.1 C)  SpO2: 96% 98%    CONSTITUTIONAL:  appears in his normal state of health, NAD NEURO:  Alert and oriented x 3, CN 3-12 grossly intact EYES:  eyes equal and reactive ENT/NECK:  Supple, no stridor  CARDIO:  normal rate, regular rhythm, appears well-perfused  PULM:  No respiratory distress, CTAB GI/GU:  non-distended,  MSK/SPINE:  No gross deformities, no edema, moves all extremities, no significant tenderness SKIN:  no rash, atraumatic   *Additional and/or pertinent findings included in MDM below  Diagnostic and Interventional Summary    EKG Interpretation  Date/Time:    Ventricular Rate:    PR Interval:    QRS Duration:   QT Interval:    QTC Calculation:   R Axis:     Text Interpretation:         Labs Reviewed - No data to display  No orders to display    Medications - No data to display   Procedures  /  Critical Care Procedures  ED Course and Medical Decision Making  I have reviewed the triage vital signs, the nursing notes, and pertinent available records from the EMR.  Social Determinants Affecting Complexity of Care: Patient is homelessness. Discussed patient's lack of housing.  Resources provided for homeless shelters.  ED Course:    Medical Decision Making Patient here  complaining of right foot pain.  States that this is not a new problem.  On exam, his right foot is without any skin breakdown, signs of infection, rash, or lesions.  He points to the top of the foot as the tender spot, and says it is worse when he dorsiflexes.  Consider possible tendinitis or other overuse injury.  There is no evidence of infection.  No reported new trauma.  I do not think any imaging is indicated at this time.  He has intact distal pulses and brisk capillary refill.  We will give him a postop shoe for symptom relief.  Risk OTC drugs.     Consultants: No consultations were needed in caring for this patient.   Treatment and Plan: Emergency department workup does not suggest an emergent condition requiring admission or immediate intervention beyond  what has been performed at this time. The patient is safe for discharge and has  been instructed to return immediately for worsening symptoms, change in  symptoms or any other concerns    Final Clinical Impressions(s) / ED Diagnoses     ICD-10-CM   1. Right foot pain  M79.671     2. Homeless  Z59.00       ED Discharge Orders     None         Discharge Instructions Discussed with and Provided to Patient:  Discharge Instructions   None      Montine Circle, PA-C 10/28/22 3532    Merrily Pew, MD 10/28/22 [phone]

## 2022-10-28 NOTE — ED Notes (Signed)
Pt was provided with a bus pass. Pt states, "I don't want to go. I have nowhere to go at this time." Pt wheeled out by ED tech. Pt stated, "I will just check back in. I don't want to go anywhere else." Remains alert and oriented x 3 which is baseline to pt. Cleared for discharge.

## 2022-10-29 ENCOUNTER — Emergency Department (HOSPITAL_COMMUNITY)
Admission: EM | Admit: 2022-10-29 | Discharge: 2022-10-30 | Discharge status: Against Medical Advice | Payer: Medicare (Managed Care) | Attending: Emergency Medicine | Admitting: Emergency Medicine

## 2022-10-29 ENCOUNTER — Encounter (HOSPITAL_COMMUNITY): Payer: Self-pay

## 2022-10-29 DIAGNOSIS — Z5321 Procedure and treatment not carried out due to patient leaving prior to being seen by health care provider: Secondary | ICD-10-CM | POA: Insufficient documentation

## 2022-10-29 DIAGNOSIS — Z765 Malingerer [conscious simulation]: Secondary | ICD-10-CM

## 2022-10-29 DIAGNOSIS — M79672 Pain in left foot: Secondary | ICD-10-CM | POA: Diagnosis present

## 2022-10-29 NOTE — ED Triage Notes (Addendum)
Pt BIBA from street . Pt c/o left foot pain x 1 year. Denies any other symptoms.

## 2022-10-29 NOTE — ED Notes (Signed)
PT not visible in lobby  

## 2022-10-30 ENCOUNTER — Encounter (HOSPITAL_COMMUNITY): Payer: Self-pay

## 2022-10-30 ENCOUNTER — Other Ambulatory Visit: Payer: Self-pay

## 2022-10-30 ENCOUNTER — Emergency Department (HOSPITAL_COMMUNITY)
Admission: EM | Admit: 2022-10-30 | Discharge: 2022-10-31 | Disposition: A | Discharge status: Home / Self Care | Payer: Medicare (Managed Care) | Attending: Emergency Medicine | Admitting: Emergency Medicine

## 2022-10-30 DIAGNOSIS — E119 Type 2 diabetes mellitus without complications: Secondary | ICD-10-CM | POA: Diagnosis not present

## 2022-10-30 DIAGNOSIS — Z5900 Homelessness unspecified: Secondary | ICD-10-CM | POA: Insufficient documentation

## 2022-10-30 DIAGNOSIS — M791 Myalgia, unspecified site: Secondary | ICD-10-CM | POA: Diagnosis present

## 2022-10-30 DIAGNOSIS — M7918 Myalgia, other site: Secondary | ICD-10-CM | POA: Insufficient documentation

## 2022-10-30 DIAGNOSIS — R638 Other symptoms and signs concerning food and fluid intake: Secondary | ICD-10-CM | POA: Diagnosis not present

## 2022-10-30 DIAGNOSIS — Z79899 Other long term (current) drug therapy: Secondary | ICD-10-CM | POA: Insufficient documentation

## 2022-10-30 DIAGNOSIS — I10 Essential (primary) hypertension: Secondary | ICD-10-CM | POA: Insufficient documentation

## 2022-10-30 DIAGNOSIS — M79673 Pain in unspecified foot: Secondary | ICD-10-CM | POA: Insufficient documentation

## 2022-10-30 MED ORDER — IBUPROFEN 800 MG PO TABS
800.0000 mg | ORAL_TABLET | Freq: Once | ORAL | Status: AC
  Administered 2022-10-31: 800 mg via ORAL
  Filled 2022-10-30: qty 1

## 2022-10-30 MED ORDER — ACETAMINOPHEN 500 MG PO TABS
1000.0000 mg | ORAL_TABLET | Freq: Once | ORAL | Status: AC
  Administered 2022-10-31: 1000 mg via ORAL
  Filled 2022-10-30: qty 2

## 2022-10-30 NOTE — ED Notes (Signed)
Pt gathered his belongings and walked out of the room stating, "I'm going back to the lobby", pt unwilling to stay for the provider. Charge nurse made aware and pt will be discharge from the system.

## 2022-10-30 NOTE — ED Provider Notes (Signed)
[facility] EMERGENCY DEPARTMENT Provider Note   CSN: [id number] Arrival date & time: 10/30/22  2142     History  Chief Complaint  Patient presents with   Generalized Body Aches    Nathan Norton is a 41 y.o. male.  The history is provided by the patient.  Foot Pain This is a chronic problem. The current episode started more than 1 week ago. The problem occurs constantly. The problem has not changed since onset.Pertinent negatives include no chest pain, no abdominal pain, no headaches and no shortness of breath. Nothing aggravates the symptoms. Nothing relieves the symptoms. He has tried nothing for the symptoms. The treatment provided no relief.  Seen for same within 24 hours.  No new trauma.       Home Medications Prior to Admission medications   Medication Sig Start Date End Date Taking? Authorizing Provider  acetaminophen (TYLENOL 8 HOUR) 650 MG CR tablet Take 1 tablet (650 mg total) by mouth every 8 (eight) hours as needed for pain. 06/19/22   Petrucelli, Glynda Jaeger, PA-C  clotrimazole (LOTRIMIN) 1 % cream Apply to affected area 2 times daily 09/30/22   Montine Circle, PA-C  loratadine (CLARITIN) 10 MG tablet Take 1 tablet (10 mg total) by mouth daily as needed for allergies or rhinitis. 06/19/22   Petrucelli, Samantha R, PA-C      Allergies    Shellfish allergy    Review of Systems   Review of Systems  Constitutional:  Negative for fever.  HENT:  Negative for facial swelling.   Eyes:  Negative for redness.  Respiratory:  Negative for shortness of breath.   Cardiovascular:  Negative for chest pain.  Gastrointestinal:  Negative for abdominal pain.  Neurological:  Negative for headaches.  All other systems reviewed and are negative.   Physical Exam Updated Vital Signs BP (!) 142/71 (BP Location: Right Arm)   Pulse 74   Temp 98 F (36.7 C)   Resp 18   SpO2 99%  Physical Exam Vitals and nursing note reviewed.  Constitutional:      General:  He is not in acute distress.    Appearance: Normal appearance. He is well-developed. He is not diaphoretic.  HENT:     Head: Normocephalic and atraumatic.     Nose: Nose normal.  Eyes:     Conjunctiva/sclera: Conjunctivae normal.     Pupils: Pupils are equal, round, and reactive to light.  Cardiovascular:     Rate and Rhythm: Normal rate and regular rhythm.     Pulses: Normal pulses.     Heart sounds: Normal heart sounds.  Pulmonary:     Effort: Pulmonary effort is normal.     Breath sounds: Normal breath sounds. No wheezing or rales.  Abdominal:     General: Bowel sounds are normal.     Palpations: Abdomen is soft.     Tenderness: There is no abdominal tenderness. There is no guarding or rebound.  Musculoskeletal:        General: Normal range of motion.     Cervical back: Normal range of motion and neck supple.  Skin:    General: Skin is warm and dry.     Capillary Refill: Capillary refill takes less than 2 seconds.  Neurological:     General: No focal deficit present.     Mental Status: He is alert and oriented to person, place, and time.     Deep Tendon Reflexes: Reflexes normal.  Psychiatric:  Mood and Affect: Mood normal.        Behavior: Behavior normal.     ED Results / Procedures / Treatments   Labs (all labs ordered are listed, but only abnormal results are displayed) Labs Reviewed - No data to display  EKG None  Radiology No results found.  Procedures Procedures    Medications Ordered in ED Medications  acetaminophen (TYLENOL) tablet 1,000 mg (has no administration in time range)  ibuprofen (ADVIL) tablet 800 mg (has no administration in time range)    ED Course/ Medical Decision Making/ A&P                           Medical Decision Making Patient seen for foot pain.    Amount and/or Complexity of Data Reviewed External Data Reviewed: notes.    Details: Previous ED notes reviewed  Radiology:     Details: Previous imaging reviewed, no  indication for new   Risk OTC drugs. Prescription drug management. Risk Details: Well appearing, is seen nearly daily for same.  No new trauma.  Stable for discharge.      Final Clinical Impression(s) / ED Diagnoses Final diagnoses:  Pain of foot, unspecified laterality   Return for intractable cough, coughing up blood, fevers > 100.4 unrelieved by medication, shortness of breath, intractable vomiting, chest pain, shortness of breath, weakness, numbness, changes in speech, facial asymmetry, abdominal pain, passing out, Inability to tolerate liquids or food, cough, altered mental status or any concerns. No signs of systemic illness or infection. The patient is nontoxic-appearing on exam and vital signs are within normal limits.  I have reviewed the triage vital signs and the nursing notes. Pertinent labs & imaging results that were available during my care of the patient were reviewed by me and considered in my medical decision making (see chart for details). After history, exam, and medical workup I feel the patient has been appropriately medically screened and is safe for discharge home. Pertinent diagnoses were discussed with the patient. Patient was given return precautions.    Rx / DC Orders ED Discharge Orders     None         Milana Salay, MD 10/30/22 2350

## 2022-10-30 NOTE — ED Triage Notes (Signed)
Body hurts

## 2022-10-30 NOTE — ED Notes (Signed)
Pt back to room from bathroom.

## 2022-10-30 NOTE — ED Notes (Signed)
Pt from lobby to the room in a wheel chair. Pt has a big box of belongings including food and a gallon jug of something liquid. Pt states "so you heard I was rink". Made patient aware that I hadn't reviewed his chart yet and that I didn't know his visit reason as of this time. Pt just looked at this nurse.

## 2022-10-30 NOTE — ED Notes (Signed)
Patient ambulates to the restroom with a steady gait 

## 2022-10-30 NOTE — ED Notes (Signed)
Pt ambulating to bathroom.

## 2022-10-30 NOTE — ED Provider Notes (Signed)
I attempted to see the patient, but was informed he had eloped.   Montine Circle, PA-C 10/30/22 Shellee Milo, MD 10/30/22 [phone]

## 2022-10-31 ENCOUNTER — Emergency Department (HOSPITAL_COMMUNITY)
Admission: EM | Admit: 2022-10-31 | Discharge: 2022-11-01 | Disposition: A | Discharge status: Home / Self Care | Payer: Medicare (Managed Care) | Source: Home / Self Care | Attending: Emergency Medicine | Admitting: Emergency Medicine

## 2022-10-31 ENCOUNTER — Encounter (HOSPITAL_COMMUNITY): Payer: Self-pay | Admitting: Emergency Medicine

## 2022-10-31 ENCOUNTER — Other Ambulatory Visit: Payer: Self-pay

## 2022-10-31 DIAGNOSIS — Z79899 Other long term (current) drug therapy: Secondary | ICD-10-CM | POA: Insufficient documentation

## 2022-10-31 DIAGNOSIS — M7918 Myalgia, other site: Secondary | ICD-10-CM | POA: Insufficient documentation

## 2022-10-31 DIAGNOSIS — Z5900 Homelessness unspecified: Secondary | ICD-10-CM | POA: Insufficient documentation

## 2022-10-31 DIAGNOSIS — E119 Type 2 diabetes mellitus without complications: Secondary | ICD-10-CM | POA: Insufficient documentation

## 2022-10-31 DIAGNOSIS — T730XXA Starvation, initial encounter: Secondary | ICD-10-CM

## 2022-10-31 DIAGNOSIS — R638 Other symptoms and signs concerning food and fluid intake: Secondary | ICD-10-CM | POA: Insufficient documentation

## 2022-10-31 DIAGNOSIS — I10 Essential (primary) hypertension: Secondary | ICD-10-CM | POA: Insufficient documentation

## 2022-10-31 NOTE — ED Triage Notes (Signed)
Patient requesting Tylenol for his body aches.

## 2022-10-31 NOTE — ED Provider Notes (Signed)
  [facility] Provider Note   CSN: [id number] Arrival date & time: 10/31/22  2244     History Chief Complaint  Patient presents with   Body Aches   Harald Quevedo is a 41 y.o. male DM, EtOH use, homelessness, HTN, TBI presents to the ED for "milk and a sandwich bag". Patient was  Seen yesterday for chronic foot pain. He reports he feels ok now, but is hungry and wanted somewhere to lay down.  HPI     Home Medications Prior to Admission medications   Medication Sig Start Date End Date Taking? Authorizing Provider  acetaminophen (TYLENOL 8 HOUR) 650 MG CR tablet Take 1 tablet (650 mg total) by mouth every 8 (eight) hours as needed for pain. 06/19/22   Petrucelli, Glynda Jaeger, PA-C  clotrimazole (LOTRIMIN) 1 % cream Apply to affected area 2 times daily 09/30/22   Montine Circle, PA-C  loratadine (CLARITIN) 10 MG tablet Take 1 tablet (10 mg total) by mouth daily as needed for allergies or rhinitis. 06/19/22   Petrucelli, Samantha R, PA-C      Allergies    Shellfish allergy    Review of Systems   Review of Systems  Respiratory:  Negative for shortness of breath.   Cardiovascular:  Negative for chest pain.  Gastrointestinal:  Negative for abdominal pain.    Physical Exam Updated Vital Signs BP 136/84 (BP Location: Right Arm)   Pulse 76   Temp 98.1 F (36.7 C) (Oral)   Resp 20   SpO2 98%  Physical Exam Vitals and nursing note reviewed.  Constitutional:      General: He is not in acute distress.    Appearance: Normal appearance. He is not ill-appearing or toxic-appearing.  Eyes:     General: No scleral icterus. Pulmonary:     Effort: Pulmonary effort is normal. No respiratory distress.  Skin:    General: Skin is dry.     Findings: No rash.  Neurological:     General: No focal deficit present.     Mental Status: He is alert. Mental status is at baseline.  Psychiatric:        Mood and Affect: Mood normal.     ED Results /  Procedures / Treatments   Labs (all labs ordered are listed, but only abnormal results are displayed) Labs Reviewed - No data to display  EKG None  Radiology No results found.  Procedures Procedures   Medications Ordered in ED Medications - No data to display  ED Course/ Medical Decision Making/ A&P   41 year old male presents the emergency room for evaluation of hunger.  Patient is requesting "milk and a sandwich bag".  He has no other complaints.  Food was provided.  Patient has been seen 59 times in the past 6 months for similar complaints.  Vital signs within normal limits.  Patient stable being discharged in good condition.                          Medical Decision Making   Final Clinical Impression(s) / ED Diagnoses Final diagnoses:  Hungry, initial encounter    Rx / DC Orders ED Discharge Orders     None         Sherrell Puller, PA-C 11/01/22 0545    Fatima Blank, MD 11/01/22 1826

## 2022-11-01 ENCOUNTER — Other Ambulatory Visit: Payer: Self-pay

## 2022-11-01 ENCOUNTER — Emergency Department (HOSPITAL_COMMUNITY)
Admission: EM | Admit: 2022-11-01 | Discharge: 2022-11-02 | Disposition: A | Discharge status: Home / Self Care | Payer: Medicare (Managed Care) | Attending: Emergency Medicine | Admitting: Emergency Medicine

## 2022-11-01 ENCOUNTER — Encounter (HOSPITAL_COMMUNITY): Payer: Self-pay | Admitting: *Deleted

## 2022-11-01 DIAGNOSIS — I10 Essential (primary) hypertension: Secondary | ICD-10-CM | POA: Insufficient documentation

## 2022-11-01 DIAGNOSIS — Z5900 Homelessness unspecified: Secondary | ICD-10-CM | POA: Diagnosis not present

## 2022-11-01 DIAGNOSIS — M25519 Pain in unspecified shoulder: Secondary | ICD-10-CM | POA: Diagnosis present

## 2022-11-01 DIAGNOSIS — R52 Pain, unspecified: Secondary | ICD-10-CM

## 2022-11-01 NOTE — ED Triage Notes (Signed)
Pt [facility] with c/o right shoulder pain 116/76, hr 60's.

## 2022-11-01 NOTE — ED Triage Notes (Signed)
Pt reports cough, congestion, and "left and right shoulder pain that goes back and forth"

## 2022-11-01 NOTE — Discharge Instructions (Signed)
You were seen in the ER for food. You were given some. I have attached a list of shelters to the discharge paperwork.

## 2022-11-02 MED ORDER — ACETAMINOPHEN 500 MG PO TABS
1000.0000 mg | ORAL_TABLET | Freq: Once | ORAL | Status: AC
  Administered 2022-11-02: 1000 mg via ORAL
  Filled 2022-11-02: qty 2

## 2022-11-02 NOTE — Discharge Instructions (Signed)
Please follow up with your PCP. I have included information on shelters in the area.

## 2022-11-02 NOTE — ED Provider Notes (Signed)
[facility] [facility] EMERGENCY DEPARTMENT Provider Note   CSN: [id number] Arrival date & time: 11/01/22  2340     History Chief Complaint  Patient presents with   Shoulder Pain    Nathan Norton is a 41 y.o. male with h/o DM, EtOH use, homelessness, HTN, TBI presents to the ED for evaluation of body aches. The patient reports that the cold weather was causing his body to ache and he is requesting Tylenol and some foods. Denies any falls or injury. He has no other complaints. Patient has had 60 visits in the past 6 months. Was seen here yesterday for food.    Shoulder Pain      Home Medications Prior to Admission medications   Medication Sig Start Date End Date Taking? Authorizing Provider  acetaminophen (TYLENOL 8 HOUR) 650 MG CR tablet Take 1 tablet (650 mg total) by mouth every 8 (eight) hours as needed for pain. 06/19/22   Petrucelli, Glynda Jaeger, PA-C  clotrimazole (LOTRIMIN) 1 % cream Apply to affected area 2 times daily 09/30/22   Montine Circle, PA-C  loratadine (CLARITIN) 10 MG tablet Take 1 tablet (10 mg total) by mouth daily as needed for allergies or rhinitis. 06/19/22   Petrucelli, Glynda Jaeger, PA-C      Allergies    Shellfish allergy    Review of Systems   Review of Systems  Musculoskeletal:  Positive for myalgias.    Physical Exam Updated Vital Signs BP 119/89   Pulse 70   Temp 98.6 F (37 C)   Resp 19   SpO2 100%  Physical Exam Vitals and nursing note reviewed.  Constitutional:      Appearance: Normal appearance.  Eyes:     General: No scleral icterus. Pulmonary:     Effort: Pulmonary effort is normal. No respiratory distress.  Skin:    General: Skin is dry.     Findings: No rash.  Neurological:     General: No focal deficit present.     Mental Status: He is alert. Mental status is at baseline.     Comments: Moving all extremities  Psychiatric:        Mood and Affect: Mood normal.     ED Results / Procedures / Treatments    Labs (all labs ordered are listed, but only abnormal results are displayed) Labs Reviewed - No data to display  EKG None  Radiology No results found.  Procedures Procedures   Medications Ordered in ED Medications  acetaminophen (TYLENOL) tablet 1,000 mg (has no administration in time range)    ED Course/ Medical Decision Making/ A&P                           Medical Decision Making Risk OTC drugs.   41 year old male presents the emergency room and for evaluation of body aches.  Patient has had 60 ER visit in the past 6 months all for similar complaints.  He is requesting food and Tylenol.  Tylenol ordered.  Vital signs are stable.  Physical exam as listed above.  Discussed the need to follow-up with PCP.  List of shelters were given in the discharge report.  Patient is stable. Discharge.   Final Clinical Impression(s) / ED Diagnoses Final diagnoses:  Body aches    Rx / DC Orders ED Discharge Orders     None         Sherrell Puller, PA-C 11/02/22 0505    Deno Etienne, DO 11/02/22  0645  

## 2022-11-02 NOTE — ED Notes (Signed)
Patient verbalizes understanding of d/c instructions. Opportunities for questions and answers were provided. Pt d/c from ED and ambulated to lobby.  

## 2022-11-03 ENCOUNTER — Other Ambulatory Visit: Payer: Self-pay

## 2022-11-03 ENCOUNTER — Encounter (HOSPITAL_COMMUNITY): Payer: Self-pay | Admitting: *Deleted

## 2022-11-03 ENCOUNTER — Emergency Department (HOSPITAL_COMMUNITY)
Admission: EM | Admit: 2022-11-03 | Discharge: 2022-11-03 | Disposition: A | Discharge status: Home / Self Care | Payer: Medicare (Managed Care) | Attending: Emergency Medicine | Admitting: Emergency Medicine

## 2022-11-03 DIAGNOSIS — M79672 Pain in left foot: Secondary | ICD-10-CM | POA: Diagnosis present

## 2022-11-03 NOTE — ED Provider Notes (Signed)
  [facility] EMERGENCY DEPARTMENT Provider Note   CSN: [id number] Arrival date & time: 11/03/22  0518     History  Chief Complaint  Patient presents with   Foot Pain    Nathan Norton is a 41 y.o. male.  41 yo male presents via EMS for left foot pain. States foot hurts, no injury, seen here for same 10/24/22, had XR that was normal. Came tonight bc it is cold outside.        Home Medications Prior to Admission medications   Medication Sig Start Date End Date Taking? Authorizing Provider  acetaminophen (TYLENOL 8 HOUR) 650 MG CR tablet Take 1 tablet (650 mg total) by mouth every 8 (eight) hours as needed for pain. 06/19/22   Petrucelli, Glynda Jaeger, PA-C  clotrimazole (LOTRIMIN) 1 % cream Apply to affected area 2 times daily 09/30/22   Montine Circle, PA-C  loratadine (CLARITIN) 10 MG tablet Take 1 tablet (10 mg total) by mouth daily as needed for allergies or rhinitis. 06/19/22   Petrucelli, Glynda Jaeger, PA-C      Allergies    Shellfish allergy    Review of Systems   Review of Systems Negative except as per HPI Physical Exam Updated Vital Signs BP 119/61 (BP Location: Left Arm)   Pulse 94   Temp 98.1 F (36.7 C) (Oral)   Resp 18   SpO2 97%  Physical Exam Vitals and nursing note reviewed.  Constitutional:      General: He is not in acute distress.    Appearance: He is well-developed. He is not diaphoretic.  HENT:     Head: Normocephalic and atraumatic.  Cardiovascular:     Pulses: Normal pulses.  Pulmonary:     Effort: Pulmonary effort is normal.  Musculoskeletal:        General: Tenderness present. No swelling, deformity or signs of injury. Normal range of motion.  Skin:    General: Skin is warm and dry.     Findings: No erythema or rash.  Neurological:     Mental Status: He is alert and oriented to person, place, and time.  Psychiatric:        Behavior: Behavior normal.     ED Results / Procedures / Treatments   Labs (all labs  ordered are listed, but only abnormal results are displayed) Labs Reviewed - No data to display  EKG None  Radiology No results found.  Procedures Procedures    Medications Ordered in ED Medications - No data to display  ED Course/ Medical Decision Making/ A&P                           Medical Decision Making  41 yo male with ongoing chronic left foot pain. No new injury. Xr 10/24/22 reviewed, negative for acute abnormality. Skin is dry and intact. DP pulse present, sensation intact, no swelling. Recommend OTC management, follow up with PCP.         Final Clinical Impression(s) / ED Diagnoses Final diagnoses:  Left foot pain    Rx / DC Orders ED Discharge Orders     None         Tacy Learn, PA-C 11/03/22 0615    Orpah Greek, MD 11/04/22 [phone]

## 2022-11-03 NOTE — Discharge Instructions (Signed)
Motrin and Tylenol as needed as directed.

## 2022-11-03 NOTE — ED Triage Notes (Signed)
Pt arrived via EMS with left foot pain.

## 2022-11-05 ENCOUNTER — Emergency Department (HOSPITAL_COMMUNITY)
Admission: EM | Admit: 2022-11-05 | Discharge: 2022-11-05 | Disposition: A | Discharge status: Home / Self Care | Payer: Medicare (Managed Care) | Attending: Emergency Medicine | Admitting: Emergency Medicine

## 2022-11-05 ENCOUNTER — Encounter (HOSPITAL_COMMUNITY): Payer: Self-pay

## 2022-11-05 DIAGNOSIS — F10120 Alcohol abuse with intoxication, uncomplicated: Secondary | ICD-10-CM | POA: Diagnosis present

## 2022-11-05 DIAGNOSIS — Z5321 Procedure and treatment not carried out due to patient leaving prior to being seen by health care provider: Secondary | ICD-10-CM | POA: Insufficient documentation

## 2022-11-05 NOTE — ED Triage Notes (Signed)
No complaints in triage, reports ETOH use tonight.

## 2022-11-05 NOTE — ED Provider Notes (Signed)
Patient was initially seen by [facility] at midnight last night.  Patient appeared to be intoxicated and reportedly he was sobering up.  He was brought back to a room where he then reported all he wanted was a [place] sandwich and he needed to go home and get some rest.  He did not desire any further medical evaluation and left before I was able to see him.   Blanchie Dessert, MD 11/05/22 [phone]

## 2023-01-28 ENCOUNTER — Emergency Department (HOSPITAL_COMMUNITY)
Admission: EM | Admit: 2023-01-28 | Discharge: 2023-01-28 | Disposition: A | Discharge status: Home / Self Care | Payer: Medicare (Managed Care) | Source: Home / Self Care

## 2023-01-28 ENCOUNTER — Other Ambulatory Visit: Payer: Self-pay

## 2023-01-28 ENCOUNTER — Emergency Department (HOSPITAL_COMMUNITY)
Admission: EM | Admit: 2023-01-28 | Discharge: 2023-01-28 | Disposition: A | Discharge status: Home / Self Care | Payer: Medicare (Managed Care) | Attending: Emergency Medicine | Admitting: Emergency Medicine

## 2023-01-28 DIAGNOSIS — R209 Unspecified disturbances of skin sensation: Secondary | ICD-10-CM | POA: Insufficient documentation

## 2023-01-28 DIAGNOSIS — M79641 Pain in right hand: Secondary | ICD-10-CM | POA: Insufficient documentation

## 2023-01-28 DIAGNOSIS — L247 Irritant contact dermatitis due to plants, except food: Secondary | ICD-10-CM | POA: Insufficient documentation

## 2023-01-28 DIAGNOSIS — Z5900 Homelessness unspecified: Secondary | ICD-10-CM | POA: Insufficient documentation

## 2023-01-28 DIAGNOSIS — M79642 Pain in left hand: Secondary | ICD-10-CM | POA: Diagnosis not present

## 2023-01-28 DIAGNOSIS — M79673 Pain in unspecified foot: Secondary | ICD-10-CM | POA: Insufficient documentation

## 2023-01-28 MED ORDER — ACETAMINOPHEN 325 MG PO TABS
650.0000 mg | ORAL_TABLET | Freq: Once | ORAL | Status: AC
  Administered 2023-01-28: 650 mg via ORAL
  Filled 2023-01-28: qty 2

## 2023-01-28 NOTE — ED Provider Triage Note (Signed)
Emergency Medicine Provider Triage Evaluation Note  Nathan Norton , a 42 y.o. male  was evaluated in triage.  Pt complains of tingling in his feet. States this has been present since he was born. States that he came in for it tonight 'because its cold outside.' Denies any new trauma or injuries. Of note, patient with 45 ED visits in the last 6 months for similar symptoms.  Review of Systems  Positive:  Negative:   Physical Exam  There were no vitals taken for this visit. Gen:   Awake, no distress   Resp:  Normal effort  MSK:   Moves extremities without difficulty  Other:    Medical Decision Making  Medically screening exam initiated at 3:52 AM.  Appropriate orders placed.  Nathan Norton was informed that the remainder of the evaluation will be completed by another provider, this initial triage assessment does not replace that evaluation, and the importance of remaining in the ED until their evaluation is complete.     Bud Face, PA-C 01/28/23 [phone]

## 2023-01-28 NOTE — ED Provider Notes (Signed)
[facility] Provider Note   CSN: [id number] Arrival date & time: 01/28/23  2049     History  Chief Complaint  Patient presents with   Rash    Nathan Norton is a 42 y.o. male who complains of rash to the left ankle of unspecified time frame  Patient states that he has been walking in grass and believes that this may have exposed him to something that caused the rash but is not entirely sure. This chief complaint was obtained by the RN nursing note. When I asked patient why he is here he does not give me a clear answer and is elusive with my questioning.  He does state that "I do not want to be outside in the cold even though I love the snow."  Requesting Tylenol for generalized body aches. Denies fever, chills, chest pain, shortness of breath, cough, or congestion. Will not further elaborate on his body aches except to say they are not new. States he does not need anything else from me at this time.      Home Medications Prior to Admission medications   Medication Sig Start Date End Date Taking? Authorizing Provider  acetaminophen (TYLENOL 8 HOUR) 650 MG CR tablet Take 1 tablet (650 mg total) by mouth every 8 (eight) hours as needed for pain. 06/19/22   Petrucelli, Glynda Jaeger, PA-C  clotrimazole (LOTRIMIN) 1 % cream Apply to affected area 2 times daily 09/30/22   Montine Circle, PA-C  loratadine (CLARITIN) 10 MG tablet Take 1 tablet (10 mg total) by mouth daily as needed for allergies or rhinitis. 06/19/22   Petrucelli, Samantha R, PA-C      Allergies    Shellfish allergy    Review of Systems   Review of Systems  Constitutional:  Negative for activity change, appetite change, chills, fatigue and fever.  HENT:  Negative for congestion, ear pain, rhinorrhea, sinus pressure, sinus pain, sore throat and trouble swallowing.   Eyes:  Negative for photophobia, pain and visual disturbance.  Respiratory:  Negative for cough, chest tightness,  shortness of breath and wheezing.   Cardiovascular:  Negative for chest pain, palpitations and leg swelling.  Gastrointestinal:  Negative for abdominal pain, diarrhea, nausea and vomiting.  Genitourinary:  Negative for dysuria and hematuria.  Musculoskeletal:  Negative for arthralgias, back pain, gait problem, neck pain and neck stiffness.  Skin:  Positive for rash. Negative for color change.  Neurological:  Negative for dizziness, seizures, syncope, speech difficulty, weakness, light-headedness and headaches.  Psychiatric/Behavioral:  Negative for confusion.   All other systems reviewed and are negative.   Physical Exam Updated Vital Signs BP (!) 152/108   Pulse 83   Temp 98 F (36.7 C) (Oral)   Resp 16   SpO2 94%  Physical Exam Vitals and nursing note reviewed.  Constitutional:      General: He is not in acute distress.    Appearance: Normal appearance. He is not ill-appearing or toxic-appearing.  HENT:     Head: Normocephalic and atraumatic.     Nose: Nose normal.     Mouth/Throat:     Mouth: Mucous membranes are moist.     Pharynx: Oropharynx is clear.  Eyes:     General: No scleral icterus.       Right eye: No discharge.        Left eye: No discharge.     Extraocular Movements: Extraocular movements intact.     Conjunctiva/sclera: Conjunctivae normal.  Pupils: Pupils are equal, round, and reactive to light.  Cardiovascular:     Rate and Rhythm: Normal rate and regular rhythm.     Heart sounds: No murmur heard. Pulmonary:     Effort: Pulmonary effort is normal. No respiratory distress.     Breath sounds: Normal breath sounds. No stridor. No wheezing, rhonchi or rales.  Chest:     Chest wall: No tenderness.  Abdominal:     General: Abdomen is flat.     Palpations: Abdomen is soft.     Tenderness: There is no abdominal tenderness. There is no guarding or rebound.  Musculoskeletal:        General: No tenderness or deformity. Normal range of motion.     Cervical  back: Normal range of motion and neck supple. No rigidity.     Right lower leg: No edema.     Left lower leg: No edema.  Lymphadenopathy:     Cervical: No cervical adenopathy.  Skin:    General: Skin is warm and dry.     Capillary Refill: Capillary refill takes less than 2 seconds.     Comments: A few small ~1cm papular lesions to left ankle that are non-erythematous, without significant swelling, without drainage  Neurological:     Mental Status: He is alert. Mental status is at baseline.  Psychiatric:        Behavior: Behavior normal.     ED Results / Procedures / Treatments   Labs (all labs ordered are listed, but only abnormal results are displayed) Labs Reviewed - No data to display  EKG None  Radiology No results found.  Procedures Procedures    Medications Ordered in ED Medications  acetaminophen (TYLENOL) tablet 650 mg (650 mg Oral Given 01/28/23 2111)    ED Course/ Medical Decision Making/ A&P                             Medical Decision Making  Medical Decision Making:   Nathan Norton is a 41 y.o. male who presented to the ED today with symptoms as detailed above.    External chart has been reviewed including recent ED visits. Complete initial physical exam performed, notably the patient had a few small lesions to left ankle that did not appear acutely infected.    Reviewed and confirmed nursing documentation for past medical history, family history, social history.    Initial Assessment:   With the patient's presentation as above, most likely diagnosis is malingering. Other diagnoses were considered including (but not limited to) contact dermatitis, cellulitis, systemic infection. These are considered less likely due to history of present illness and physical exam findings.     Initial Plan:  Objective evaluation as reviewed   Initial Study Results:   Laboratory  I do not believe any lab studies are indicated at this time.   Radiology:  I do not  believe radiological studies are indicated at this time.   Final Assessment and Plan:   This is a 42 year old male with a known history of homelessness who is well-known to the emergency department presenting to the ED today with vague complaints.  Most notably, he does complain of a rash to his left ankle.  Otherwise, he is elusive with further questioning and does not voice any other acute concerns for today's visit except for that he is concerned about staying in the cold due to his lack of current shelter.  There are a  few small papular lesions to the left ankle but these do not appear acutely infected, patient's vital signs are unremarkable, he has voiced no other concerns, and I believe he is stable for discharge.  He did ask for a dose of Tylenol which was provided.  Patient social situation is unfortunate, however, he has had multiple recent emergency department visits that are not indicated.  With this, I provided an extensive resource guide for shelters and financial assistance with patient's discharge paperwork in addition to primary care follow up.  Patient expressed significant gratitude for this and is happy to be discharged at this time.  All questions answered, stable for discharge, strict ED return precautions given.   Clinical Impression:  1. Irritant contact dermatitis due to plants, except food      Discharge           Final Clinical Impression(s) / ED Diagnoses Final diagnoses:  Irritant contact dermatitis due to plants, except food    Rx / DC Orders ED Discharge Orders     None         Turner Daniels 01/28/23 2125    Cristie Hem, MD 01/28/23 2202

## 2023-01-28 NOTE — Discharge Instructions (Signed)
Please follow up with your PCP in the office.  I have put information for a provider that can help out people with few means to pay.  They can help you with place to stay and also help with your medical care.  Please try to give them a call.  Return for worsening symptoms.

## 2023-01-28 NOTE — ED Triage Notes (Signed)
Patient arrived with EMS from street , reports tingling of hands and feet since birth.

## 2023-01-28 NOTE — ED Provider Notes (Signed)
[facility] Provider Note   CSN: [id number] Arrival date & time: 01/28/23  0346     History  Chief Complaint  Patient presents with   Tingling of Hand and Feet since birth     Nathan Norton is a 42 y.o. male.  42 yo M with a cc of hand and foot pain.  Send like a chronic problem for him.  He tells me this happens when he gets cold outside.  He tells me he is hungry and would like something to eat.  Also feels like he needs a new pair of socks.        Home Medications Prior to Admission medications   Medication Sig Start Date End Date Taking? Authorizing Provider  acetaminophen (TYLENOL 8 HOUR) 650 MG CR tablet Take 1 tablet (650 mg total) by mouth every 8 (eight) hours as needed for pain. 06/19/22   Petrucelli, Glynda Jaeger, PA-C  clotrimazole (LOTRIMIN) 1 % cream Apply to affected area 2 times daily 09/30/22   Montine Circle, PA-C  loratadine (CLARITIN) 10 MG tablet Take 1 tablet (10 mg total) by mouth daily as needed for allergies or rhinitis. 06/19/22   Petrucelli, Glynda Jaeger, PA-C      Allergies    Shellfish allergy    Review of Systems   Review of Systems  Physical Exam Updated Vital Signs BP 136/89 (BP Location: Right Arm)   Pulse 93   Temp 98.1 F (36.7 C) (Oral)   Resp 20   SpO2 98%  Physical Exam Vitals and nursing note reviewed.  Constitutional:      Appearance: He is well-developed.  HENT:     Head: Normocephalic and atraumatic.  Eyes:     Pupils: Pupils are equal, round, and reactive to light.  Neck:     Vascular: No JVD.  Cardiovascular:     Rate and Rhythm: Normal rate and regular rhythm.     Heart sounds: No murmur heard.    No friction rub. No gallop.  Pulmonary:     Effort: No respiratory distress.     Breath sounds: No wheezing.  Abdominal:     General: There is no distension.     Tenderness: There is no abdominal tenderness. There is no guarding or rebound.  Musculoskeletal:         General: Normal range of motion.     Cervical back: Normal range of motion and neck supple.  Skin:    Coloration: Skin is not pale.     Findings: No rash.  Neurological:     Mental Status: He is alert and oriented to person, place, and time.  Psychiatric:        Behavior: Behavior normal.     ED Results / Procedures / Treatments   Labs (all labs ordered are listed, but only abnormal results are displayed) Labs Reviewed - No data to display  EKG None  Radiology No results found.  Procedures Procedures    Medications Ordered in ED Medications - No data to display  ED Course/ Medical Decision Making/ A&P                             Medical Decision Making  42 yo M with a cc of hand pain.  Tells me that he wants a sandwich and some socks.  Tells me the pain is a bit better after being inside from the cold.  Tells  me that he had hand and foot pain off and on for about 40 years.  Do not feel that further workup is warranted.  Discharged home.  9:23 AM:  I have discussed the diagnosis/risks/treatment options with the patient.  Evaluation and diagnostic testing in the emergency department does not suggest an emergent condition requiring admission or immediate intervention beyond what has been performed at this time.  They will follow up with PCP. We also discussed returning to the ED immediately if new or worsening sx occur. We discussed the sx which are most concerning (e.g., sudden worsening pain, fever, inability to tolerate by mouth) that necessitate immediate return. Medications administered to the patient during their visit and any new prescriptions provided to the patient are listed below.  Medications given during this visit Medications - No data to display   The patient appears reasonably screen and/or stabilized for discharge and I doubt any other medical condition or other [facility] requiring further screening, evaluation, or treatment in the ED at this time prior to  discharge.          Final Clinical Impression(s) / ED Diagnoses Final diagnoses:  Bilateral hand pain    Rx / DC Orders ED Discharge Orders     None         Deno Etienne, DO 01/28/23 [phone]

## 2023-01-28 NOTE — ED Triage Notes (Signed)
Patient reports skin rashes at left lower leg .

## 2023-01-28 NOTE — Discharge Instructions (Addendum)
Please see attached educational material regarding ways to care for the rash to your leg.  It does not appear like you have a significant infection to the skin that would require antibiotics at this time.  I have also provided multiple resources for you including a resource guide with shelter information and a resource guide for financial assistance.  I believe that it is really important that you establish a relationship with a primary care provider who can help prevent you from having so many ED visits in the future.  I provided the names of 2 clinics you may you may contact to arrange an appointment.  If you develop any worsening symptoms or other concerns, please be reevaluated in the nearest emergency department.

## 2023-01-29 ENCOUNTER — Emergency Department (HOSPITAL_COMMUNITY)
Admission: EM | Admit: 2023-01-29 | Discharge: 2023-01-29 | Disposition: A | Discharge status: Home / Self Care | Payer: Medicare (Managed Care) | Attending: Emergency Medicine | Admitting: Emergency Medicine

## 2023-01-29 DIAGNOSIS — R638 Other symptoms and signs concerning food and fluid intake: Secondary | ICD-10-CM | POA: Diagnosis not present

## 2023-01-29 DIAGNOSIS — R21 Rash and other nonspecific skin eruption: Secondary | ICD-10-CM | POA: Insufficient documentation

## 2023-01-29 DIAGNOSIS — Z765 Malingerer [conscious simulation]: Secondary | ICD-10-CM | POA: Diagnosis not present

## 2023-01-29 NOTE — ED Provider Notes (Signed)
[facility] Provider Note   CSN: [id number] Arrival date & time: 01/29/23  0124     History  Chief Complaint  Patient presents with   Hungry   Rash    Nathan Norton is a 42 y.o. male who is well-known to this department with nearly 40 visits in the last 3 months, homeless.  Patient with history of TBI, regularly presents with request for something to eat.  Was seen this evening at [facility] by another EDP for desire to stay out of the cold and for report of rash on his ankle.  Not report complaining of rash to me at this time, simply stating that he would like to be out of the cold and would like something to eat.  No acute concerns.  I reviewed his medical records, history of type 2 diabetes, herpes and malingering in the past.  No medications daily.  HPI     Home Medications Prior to Admission medications   Medication Sig Start Date End Date Taking? Authorizing Provider  acetaminophen (TYLENOL 8 HOUR) 650 MG CR tablet Take 1 tablet (650 mg total) by mouth every 8 (eight) hours as needed for pain. 06/19/22   Petrucelli, Glynda Jaeger, PA-C  clotrimazole (LOTRIMIN) 1 % cream Apply to affected area 2 times daily 09/30/22   Montine Circle, PA-C  loratadine (CLARITIN) 10 MG tablet Take 1 tablet (10 mg total) by mouth daily as needed for allergies or rhinitis. 06/19/22   Petrucelli, Samantha R, PA-C      Allergies    Shellfish allergy    Review of Systems   Review of Systems  Constitutional:        Hungry    Physical Exam Updated Vital Signs BP (!) 147/105   Pulse 86   Temp 98.3 F (36.8 C) (Oral)   Resp 16   SpO2 99%  Physical Exam Vitals and nursing note reviewed.  Constitutional:      Appearance: He is not ill-appearing or toxic-appearing.  HENT:     Head: Normocephalic and atraumatic.  Eyes:     General: No scleral icterus.       Right eye: No discharge.        Left eye: No discharge.      Conjunctiva/sclera: Conjunctivae normal.  Pulmonary:     Effort: Pulmonary effort is normal.  Abdominal:     General: There is no distension.     Palpations: Abdomen is soft.     Tenderness: There is no abdominal tenderness.  Skin:    General: Skin is warm and dry.  Neurological:     General: No focal deficit present.     Mental Status: He is alert.  Psychiatric:        Mood and Affect: Mood normal.     ED Results / Procedures / Treatments   Labs (all labs ordered are listed, but only abnormal results are displayed) Labs Reviewed - No data to display  EKG None  Radiology No results found.  Procedures Procedures    Medications Ordered in ED Medications - No data to display  ED Course/ Medical Decision Making/ A&P                             Medical Decision Making 42 year old male presents with request for something a normal place to stay.  No medical concern at this time per patient.  Hypertensive on intake  vitals otherwise normal.  Cardiopulmonary exam is normal, abdominal exam is benign.  Patient neurovascular intact in all extremities and at his baseline per my prior encounters with the patient.  Provided with something to eat.  No indication for further evaluation in the ED this evening.   ED visit appears to be primarily for secondary gain of a more place to stay and something to eat.  Patient was provided with food.  No indication for further workup at this time.  Nathan Norton  voiced understanding of his medical evaluation and treatment plan. Each of their questions answered to their expressed satisfaction.  Return precautions were given.  Patient is well-appearing, stable, and was discharged in good condition.  This chart was dictated using voice recognition software, Dragon. Despite the best efforts of this provider to proofread and correct errors, errors may still occur which can change documentation meaning.     Final Clinical Impression(s) / ED  Diagnoses Final diagnoses:  None    Rx / DC Orders ED Discharge Orders     None         Aura Dials 01/29/23 0239    Molpus, Jenny Reichmann, MD 01/29/23 [phone]

## 2023-01-29 NOTE — ED Triage Notes (Signed)
Patient arrived with complaint of abdominal pain that has been going on for two days, states he's hungry because he lost his food stamp card. Also seen a few hours ago for a rash, states its itchy.

## 2023-02-05 ENCOUNTER — Encounter (HOSPITAL_COMMUNITY): Payer: Self-pay

## 2023-02-05 ENCOUNTER — Other Ambulatory Visit: Payer: Self-pay

## 2023-02-05 ENCOUNTER — Emergency Department (HOSPITAL_COMMUNITY)
Admission: EM | Admit: 2023-02-05 | Discharge: 2023-02-05 | Disposition: A | Discharge status: Home / Self Care | Payer: Medicare (Managed Care) | Attending: Emergency Medicine | Admitting: Emergency Medicine

## 2023-02-05 DIAGNOSIS — M791 Myalgia, unspecified site: Secondary | ICD-10-CM | POA: Insufficient documentation

## 2023-02-05 DIAGNOSIS — F10920 Alcohol use, unspecified with intoxication, uncomplicated: Secondary | ICD-10-CM | POA: Diagnosis not present

## 2023-02-05 MED ORDER — ACETAMINOPHEN 325 MG PO TABS
650.0000 mg | ORAL_TABLET | Freq: Four times a day (QID) | ORAL | Status: DC | PRN
  Administered 2023-02-05: 650 mg via ORAL
  Filled 2023-02-05: qty 2

## 2023-02-05 NOTE — ED Provider Notes (Signed)
  [facility] Provider Note   CSN: [id number] Arrival date & time: 02/05/23  0923     History {Add pertinent medical, surgical, social history, OB history to HPI:1} Chief Complaint  Patient presents with   Alcohol Intoxication   Pain    Nathan Norton is a 42 y.o. male.   Alcohol Intoxication       Home Medications Prior to Admission medications   Medication Sig Start Date End Date Taking? Authorizing Provider  acetaminophen (TYLENOL 8 HOUR) 650 MG CR tablet Take 1 tablet (650 mg total) by mouth every 8 (eight) hours as needed for pain. 06/19/22   Petrucelli, Glynda Jaeger, PA-C  clotrimazole (LOTRIMIN) 1 % cream Apply to affected area 2 times daily 09/30/22   Montine Circle, PA-C  loratadine (CLARITIN) 10 MG tablet Take 1 tablet (10 mg total) by mouth daily as needed for allergies or rhinitis. 06/19/22   Petrucelli, Glynda Jaeger, PA-C      Allergies    Shellfish allergy    Review of Systems   Review of Systems  Physical Exam Updated Vital Signs BP 121/85 (BP Location: Left Arm)   Pulse 88   Temp 98.4 F (36.9 C) (Oral)   Resp 16   Ht 6\' 2"  (1.88 m)   Wt 97 kg   SpO2 98%   BMI 27.46 kg/m  Physical Exam  ED Results / Procedures / Treatments   Labs (all labs ordered are listed, but only abnormal results are displayed) Labs Reviewed - No data to display  EKG None  Radiology No results found.  Procedures Procedures  {Document cardiac monitor, telemetry assessment procedure when appropriate:1}  Medications Ordered in ED Medications  acetaminophen (TYLENOL) tablet 650 mg (650 mg Oral Given 02/05/23 3007)    ED Course/ Medical Decision Making/ A&P   {   Click here for ABCD2, HEART and other calculatorsREFRESH Note before signing :1}                          Medical Decision Making Risk OTC drugs.   ***  {Document critical care time when appropriate:1} {Document review of labs and clinical decision tools  ie heart score, Chads2Vasc2 etc:1}  {Document your independent review of radiology images, and any outside records:1} {Document your discussion with family members, caretakers, and with consultants:1} {Document social determinants of health affecting pt's care:1} {Document your decision making why or why not admission, treatments were needed:1} Final Clinical Impression(s) / ED Diagnoses Final diagnoses:  Myalgia    Rx / DC Orders ED Discharge Orders     None

## 2023-02-05 NOTE — ED Notes (Signed)
Patient ambulated to the bathroom independently.

## 2023-02-05 NOTE — ED Notes (Signed)
Patient in bed, conversating with himself and giggling. No distress noted. Will continue to monitor.

## 2023-02-05 NOTE — ED Notes (Signed)
Patient in bed, having a conversation with himself. No distress noted. Will continue to monitor.

## 2023-02-05 NOTE — ED Triage Notes (Signed)
Patient bib [facility] [place] [employer] after staff called because he was bent over in pain. Patient states he has all over pain, HTN, and states he has been drinking alcohol. [facility] states they found him slumped over in a chair. Patient Denies drugs  and is Alert and refusing questions asked by EMS and staff.  [facility] reports VSS.

## 2023-02-06 ENCOUNTER — Emergency Department (HOSPITAL_COMMUNITY)
Admission: EM | Admit: 2023-02-06 | Discharge: 2023-02-06 | Disposition: A | Discharge status: Home / Self Care | Payer: Medicare (Managed Care) | Attending: Emergency Medicine | Admitting: Emergency Medicine

## 2023-02-06 ENCOUNTER — Other Ambulatory Visit: Payer: Self-pay

## 2023-02-06 ENCOUNTER — Encounter (HOSPITAL_COMMUNITY): Payer: Self-pay | Admitting: *Deleted

## 2023-02-06 DIAGNOSIS — I10 Essential (primary) hypertension: Secondary | ICD-10-CM | POA: Diagnosis not present

## 2023-02-06 DIAGNOSIS — Z5900 Homelessness unspecified: Secondary | ICD-10-CM | POA: Insufficient documentation

## 2023-02-06 DIAGNOSIS — T730XXA Starvation, initial encounter: Secondary | ICD-10-CM | POA: Insufficient documentation

## 2023-02-06 NOTE — ED Triage Notes (Signed)
The pt is home less several ed visits tonight he reports total body aches when asked how long he reported a long time and he is also c/o being hungry

## 2023-02-06 NOTE — ED Provider Notes (Signed)
[facility] Provider Note   CSN: [id number] Arrival date & time: 02/06/23  0146     History  Chief Complaint  Patient presents with   Generalized Body Aches    Nathan Norton is a 42 y.o. male.  The history is provided by the patient and medical records.   42 year old male with history of schizoaffective disorder, alcohol abuse, homelessness, hypertension, presenting to the ED for reported body aches.  Patient was sleeping upon initial assessment, awoken and states he is cold and hungry.  He states he has not eaten today.  He has no acute complaints otherwise.  Well known to this ED for similar, 59 visits in the past 6 months.  Home Medications Prior to Admission medications   Medication Sig Start Date End Date Taking? Authorizing Provider  acetaminophen (TYLENOL 8 HOUR) 650 MG CR tablet Take 1 tablet (650 mg total) by mouth every 8 (eight) hours as needed for pain. 06/19/22   Petrucelli, Glynda Jaeger, PA-C  clotrimazole (LOTRIMIN) 1 % cream Apply to affected area 2 times daily 09/30/22   Montine Circle, PA-C  loratadine (CLARITIN) 10 MG tablet Take 1 tablet (10 mg total) by mouth daily as needed for allergies or rhinitis. 06/19/22   Petrucelli, Glynda Jaeger, PA-C      Allergies    Shellfish allergy    Review of Systems   Review of Systems  Constitutional:        Homeless, hungry  All other systems reviewed and are negative.   Physical Exam Updated Vital Signs BP 116/75   Pulse 76   Temp 99 F (37.2 C)   Resp 16   Ht 6\' 2"  (1.88 m)   Wt 97 kg   SpO2 100%   BMI 27.46 kg/m   Physical Exam Vitals and nursing note reviewed.  Constitutional:      Appearance: He is well-developed.     Comments: Sleeping, awoken for exam, appears at baseline  HENT:     Head: Normocephalic and atraumatic.  Eyes:     Conjunctiva/sclera: Conjunctivae normal.     Pupils: Pupils are equal, round, and reactive to light.  Cardiovascular:     Rate  and Rhythm: Normal rate and regular rhythm.     Heart sounds: Normal heart sounds.  Pulmonary:     Effort: Pulmonary effort is normal.     Breath sounds: Normal breath sounds.  Abdominal:     General: Bowel sounds are normal.     Palpations: Abdomen is soft.  Musculoskeletal:        General: Normal range of motion.     Cervical back: Normal range of motion.  Skin:    General: Skin is warm and dry.  Neurological:     Mental Status: He is alert and oriented to person, place, and time.     ED Results / Procedures / Treatments   Labs (all labs ordered are listed, but only abnormal results are displayed) Labs Reviewed - No data to display  EKG None  Radiology No results found.  Procedures Procedures    Medications Ordered in ED Medications - No data to display  ED Course/ Medical Decision Making/ A&P                             Medical Decision Making  42 year old male presenting to the ED with reported body aches, however tells me he is just cold and  hungry.  He is well-known to the ED for similar.  He is sleeping on exam but does arouse appropriately.  Vitals are stable.  He does not appear to have any acute complaints.  He was given male in sandwich, allowed to rest for a bit.  Appears stable for discharge.  Can return here for new concerns.  Final Clinical Impression(s) / ED Diagnoses Final diagnoses:  Hungry, initial encounter    Rx / DC Orders ED Discharge Orders     None         Larene Pickett, PA-C 02/06/23 0554    Merryl Hacker, MD 02/06/23 [phone]

## 2023-02-07 ENCOUNTER — Other Ambulatory Visit: Payer: Self-pay

## 2023-02-07 ENCOUNTER — Emergency Department (HOSPITAL_COMMUNITY)
Admission: EM | Admit: 2023-02-07 | Discharge: 2023-02-07 | Disposition: A | Discharge status: Home / Self Care | Payer: Medicare (Managed Care) | Attending: Emergency Medicine | Admitting: Emergency Medicine

## 2023-02-07 DIAGNOSIS — R000 Tachycardia, unspecified: Secondary | ICD-10-CM | POA: Diagnosis not present

## 2023-02-07 DIAGNOSIS — M79673 Pain in unspecified foot: Secondary | ICD-10-CM | POA: Insufficient documentation

## 2023-02-07 DIAGNOSIS — M79609 Pain in unspecified limb: Secondary | ICD-10-CM

## 2023-02-07 LAB — BASIC METABOLIC PANEL
Anion gap: 13 (ref 5–15)
BUN: 11 mg/dL (ref 6–20)
CO2: 26 mmol/L (ref 22–32)
Calcium: 8.5 mg/dL — ABNORMAL LOW (ref 8.9–10.3)
Chloride: 97 mmol/L — ABNORMAL LOW (ref 98–111)
Creatinine, Ser: 1.14 mg/dL (ref 0.61–1.24)
GFR, Estimated: >60 mL/min (ref >60)
Glucose, Bld: 135 mg/dL — ABNORMAL HIGH (ref 70–99)
Potassium: 3.5 mmol/L (ref 3.5–5.1)
Sodium: 136 mmol/L (ref 135–145)

## 2023-02-07 LAB — CBC WITH DIFFERENTIAL/PLATELET
Abs Immature Granulocytes: 0.01 10*3/uL (ref 0.00–0.07)
Basophils Absolute: 0 10*3/uL (ref 0.0–0.1)
Basophils Relative: 1 %
Eosinophils Absolute: 0.4 10*3/uL (ref 0.0–0.5)
Eosinophils Relative: 6 %
HCT: 36.8 % — ABNORMAL LOW (ref 39.0–52.0)
Hemoglobin: 12.1 g/dL — ABNORMAL LOW (ref 13.0–17.0)
Immature Granulocytes: 0 %
Lymphocytes Relative: 16 %
Lymphs Abs: 1 10*3/uL (ref 0.7–4.0)
MCH: 31.8 pg (ref 26.0–34.0)
MCHC: 32.9 g/dL (ref 30.0–36.0)
MCV: 96.8 fL (ref 80.0–100.0)
Monocytes Absolute: 0.5 10*3/uL (ref 0.1–1.0)
Monocytes Relative: 9 %
Neutro Abs: 4 10*3/uL (ref 1.7–7.7)
Neutrophils Relative %: 68 %
Platelets: 285 10*3/uL (ref 150–400)
RBC: 3.8 MIL/uL — ABNORMAL LOW (ref 4.22–5.81)
RDW: 15.1 % (ref 11.5–15.5)
WBC: 5.9 10*3/uL (ref 4.0–10.5)
nRBC: 0 % (ref 0.0–0.2)

## 2023-02-07 NOTE — ED Provider Notes (Signed)
[facility] Provider Note   CSN: [id number] Arrival date & time: 02/07/23  [id number]     History  Chief Complaint  Patient presents with   Foot Pain    Nathan Norton is a 42 y.o. male.  Patient presents to the emergency department stating that he is experiencing pain all over.  He reports pain in all of his joints that has been present "since day 1".  Denies any direct injury.  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       Home Medications Prior to Admission medications   Medication Sig Start Date End Date Taking? Authorizing Provider  acetaminophen (TYLENOL 8 HOUR) 650 MG CR tablet Take 1 tablet (650 mg total) by mouth every 8 (eight) hours as needed for pain. 06/19/22   Petrucelli, Glynda Jaeger, PA-C  clotrimazole (LOTRIMIN) 1 % cream Apply to affected area 2 times daily 09/30/22   Montine Circle, PA-C  loratadine (CLARITIN) 10 MG tablet Take 1 tablet (10 mg total) by mouth daily as needed for allergies or rhinitis. 06/19/22   Petrucelli, Glynda Jaeger, PA-C      Allergies    Shellfish allergy    Review of Systems   Review of Systems  Physical Exam Updated Vital Signs BP 123/69   Pulse (!) 108   Temp 98.9 F (37.2 C) (Oral)   Resp (!) 21   Ht 6' 2"$  (1.88 m)   Wt 97 kg   SpO2 90%   BMI 27.46 kg/m  Physical Exam Vitals and nursing note reviewed.  Constitutional:      General: He is not in acute distress.    Appearance: He is well-developed.  HENT:     Head: Atraumatic.     Mouth/Throat:     Mouth: Mucous membranes are moist.  Eyes:     General: Vision grossly intact. Gaze aligned appropriately.     Extraocular Movements: Extraocular movements intact.     Conjunctiva/sclera: Conjunctivae normal.  Cardiovascular:     Rate and Rhythm: Regular rhythm. Tachycardia present.     Pulses: Normal pulses.     Heart sounds:  Normal heart sounds, S1 normal and S2 normal. No murmur heard.    No friction rub. No gallop.  Pulmonary:     Effort: Pulmonary effort is normal. No respiratory distress.     Breath sounds: Normal breath sounds.  Abdominal:     Palpations: Abdomen is soft.     Tenderness: There is no abdominal tenderness. There is no guarding or rebound.     Hernia: No hernia is present.  Musculoskeletal:        General: No swelling.     Cervical back: Full passive range of motion without pain, normal range of motion and neck supple. No pain with movement, spinous process tenderness or muscular tenderness. Normal range of motion.     Right lower leg: No edema.     Left lower leg: No edema.  Skin:    General: Skin is warm and dry.     Capillary Refill: Capillary refill takes less than 2 seconds.     Findings: No ecchymosis, erythema, lesion or wound.  Neurological:     Mental Status: He is alert and oriented to person, place, and time.     GCS: GCS eye subscore is 4. GCS verbal subscore is 5. GCS motor  subscore is 6.     Cranial Nerves: Cranial nerves 2-12 are intact.     Sensory: Sensation is intact.     Motor: Motor function is intact. No weakness or abnormal muscle tone.     Coordination: Coordination is intact.  Psychiatric:        Mood and Affect: Mood normal.        Speech: Speech normal.        Behavior: Behavior normal.     ED Results / Procedures / Treatments   Labs (all labs ordered are listed, but only abnormal results are displayed) Labs Reviewed  CBC WITH DIFFERENTIAL/PLATELET - Abnormal; Notable for the following components:      Result Value   RBC 3.80 (*)    Hemoglobin 12.1 (*)    HCT 36.8 (*)    All other components within normal limits  BASIC METABOLIC PANEL - Abnormal; Notable for the following components:   Chloride 97 (*)    Glucose, Bld 135 (*)    Calcium 8.5 (*)    All other components within normal limits    EKG EKG Interpretation  Date/Time:  Friday February 07 2023 06:09:54 EST Ventricular Rate:  104 PR Interval:  124 QRS Duration: 92 QT Interval:  383 QTC Calculation: 504 R Axis:   79 Text Interpretation: Sinus tachycardia Minimal ST depression, inferior leads Prolonged QT interval Confirmed by Orpah Greek [phone]) on 02/07/2023 6:41:06 AM  Radiology No results found.  Procedures Procedures    Medications Ordered in ED Medications - No data to display  ED Course/ Medical Decision Making/ A&P                             Medical Decision Making Amount and/or Complexity of Data Reviewed Labs: ordered.   Presents with complaints of pain all over.  Patient well-known to the ED for frequent visits.  Generally is here because he is hungry and homeless.  He appears well.  None of the joints that he says are hurting him are swollen, erythematous, warm.  He Clearence Cheek that practic all of his joints hurt and this is chronic and has been present his whole life.  Examination not suggestive of infection or trauma.  He is, however, noted to be tachycardic.  Therefore initiated.  EKG shows sinus tachycardia around 105.  He is not anemic.  Glucose is well-controlled, no signs of DKA.  Patient has eaten a sandwich and is now sleeping without complaints.  Will discharge.        Final Clinical Impression(s) / ED Diagnoses Final diagnoses:  Pain in extremity, unspecified extremity    Rx / DC Orders ED Discharge Orders     None         Early Ord, Gwenyth Allegra, MD 02/07/23 [phone]

## 2023-02-07 NOTE — ED Triage Notes (Signed)
Pt bib [facility] from [facility]; pt c/o bilateral foot pain due to his neuropathy and walking alot. Pt does admit to having ETOH. Pt has hx of schizophrenia; not compliant with medications.

## 2023-02-07 NOTE — ED Notes (Signed)
Patient resting in bed with eyes closed, no s/s of distress, patient denies any needs or concerns.

## 2023-02-07 NOTE — ED Notes (Signed)
Sandwich and soda provided to the pt.

## 2023-02-10 ENCOUNTER — Emergency Department (HOSPITAL_COMMUNITY)
Admission: EM | Admit: 2023-02-10 | Discharge: 2023-02-10 | Disposition: A | Discharge status: Home / Self Care | Payer: Medicare (Managed Care) | Attending: Emergency Medicine | Admitting: Emergency Medicine

## 2023-02-10 ENCOUNTER — Emergency Department (HOSPITAL_COMMUNITY): Payer: Medicare (Managed Care)

## 2023-02-10 ENCOUNTER — Other Ambulatory Visit: Payer: Self-pay

## 2023-02-10 DIAGNOSIS — E876 Hypokalemia: Secondary | ICD-10-CM | POA: Diagnosis not present

## 2023-02-10 DIAGNOSIS — E119 Type 2 diabetes mellitus without complications: Secondary | ICD-10-CM | POA: Diagnosis not present

## 2023-02-10 DIAGNOSIS — R400 Somnolence: Secondary | ICD-10-CM | POA: Insufficient documentation

## 2023-02-10 DIAGNOSIS — I10 Essential (primary) hypertension: Secondary | ICD-10-CM | POA: Diagnosis not present

## 2023-02-10 DIAGNOSIS — Y908 Blood alcohol level of 240 mg/100 ml or more: Secondary | ICD-10-CM | POA: Insufficient documentation

## 2023-02-10 DIAGNOSIS — F10920 Alcohol use, unspecified with intoxication, uncomplicated: Secondary | ICD-10-CM

## 2023-02-10 LAB — CBC WITH DIFFERENTIAL/PLATELET
Abs Immature Granulocytes: 0.02 10*3/uL (ref 0.00–0.07)
Basophils Absolute: 0 10*3/uL (ref 0.0–0.1)
Basophils Relative: 1 %
Eosinophils Absolute: 0.1 10*3/uL (ref 0.0–0.5)
Eosinophils Relative: 3 %
HCT: 39.7 % (ref 39.0–52.0)
Hemoglobin: 13 g/dL (ref 13.0–17.0)
Immature Granulocytes: 0 %
Lymphocytes Relative: 24 %
Lymphs Abs: 1.1 10*3/uL (ref 0.7–4.0)
MCH: 32.4 pg (ref 26.0–34.0)
MCHC: 32.7 g/dL (ref 30.0–36.0)
MCV: 99 fL (ref 80.0–100.0)
Monocytes Absolute: 0.4 10*3/uL (ref 0.1–1.0)
Monocytes Relative: 9 %
Neutro Abs: 2.9 10*3/uL (ref 1.7–7.7)
Neutrophils Relative %: 63 %
Platelets: 320 10*3/uL (ref 150–400)
RBC: 4.01 MIL/uL — ABNORMAL LOW (ref 4.22–5.81)
RDW: 15.2 % (ref 11.5–15.5)
WBC: 4.6 10*3/uL (ref 4.0–10.5)
nRBC: 0 % (ref 0.0–0.2)

## 2023-02-10 LAB — COMPREHENSIVE METABOLIC PANEL
ALT: 18 U/L (ref 0–44)
AST: 20 U/L (ref 15–41)
Albumin: 3.9 g/dL (ref 3.5–5.0)
Alkaline Phosphatase: 87 U/L (ref 38–126)
Anion gap: 8 (ref 5–15)
BUN: 12 mg/dL (ref 6–20)
CO2: 25 mmol/L (ref 22–32)
Calcium: 8.5 mg/dL — ABNORMAL LOW (ref 8.9–10.3)
Chloride: 102 mmol/L (ref 98–111)
Creatinine, Ser: 0.93 mg/dL (ref 0.61–1.24)
GFR, Estimated: >60 mL/min (ref >60)
Glucose, Bld: 101 mg/dL — ABNORMAL HIGH (ref 70–99)
Potassium: 3.2 mmol/L — ABNORMAL LOW (ref 3.5–5.1)
Sodium: 135 mmol/L (ref 135–145)
Total Bilirubin: 0.5 mg/dL (ref 0.3–1.2)
Total Protein: 7.5 g/dL (ref 6.5–8.1)

## 2023-02-10 LAB — ETHANOL: Alcohol, Ethyl (B): 305 mg/dL (ref <10)

## 2023-02-10 LAB — CBG MONITORING, ED: Glucose-Capillary: 102 mg/dL — ABNORMAL HIGH (ref 70–99)

## 2023-02-10 MED ORDER — NALOXONE HCL 0.4 MG/ML IJ SOLN
0.4000 mg | Freq: Once | INTRAMUSCULAR | Status: AC
  Administered 2023-02-10: 0.4 mg via INTRAVENOUS
  Filled 2023-02-10: qty 1

## 2023-02-10 MED ORDER — LACTATED RINGERS IV BOLUS
1000.0000 mL | Freq: Once | INTRAVENOUS | Status: AC
  Administered 2023-02-10: 1000 mL via INTRAVENOUS

## 2023-02-10 MED ORDER — POTASSIUM CHLORIDE CRYS ER 20 MEQ PO TBCR: 40.0000 meq | EXTENDED_RELEASE_TABLET | Freq: Once | ORAL | Status: DC

## 2023-02-10 NOTE — ED Notes (Signed)
Pt verbal and communicating, pt ate 2 sandwich's and had 2 beverages and asked to go. Pt IV was removed by another nurse. Pt removed his condom catheter got dressed and left. Pt left without being discharge in good/stable condition.

## 2023-02-10 NOTE — ED Triage Notes (Signed)
BIB EMS after being found on bus in back row, pt not responding but did start back prior to Narcan being given. Pt has pin point pupils, maintaining airway, not responding to painful stimuli. 12 Ld done

## 2023-02-10 NOTE — ED Notes (Signed)
Pt sleeping/ sedated/ intoxicated (ETOH 305). VSS. Rectal temp low. Placed on bear hugger. Condom cath placed, applied with leg strap.

## 2023-02-10 NOTE — ED Provider Notes (Signed)
Care transferred to me.  I suspect his altered mental status was alcohol related.  It took him a few hours but now he is awake and responsive.  Temperature has also improved.  He was able to eat. Plan was to ambulate and then likely d/c, but apparently he took all the medical equipment off of him and walked out.   Sherwood Gambler, MD 02/10/23 [phone]

## 2023-02-10 NOTE — ED Notes (Signed)
Pt is awake, he responds to his name but is not coherent. MD made aware.

## 2023-02-10 NOTE — ED Provider Notes (Signed)
[facility] DEPART[facility] AT [facility] Provider Note   CSN: [id number] Arrival date & time: 02/10/23  1400     History  Chief Complaint  Patient presents with   unresponsive    Nathan Norton is a 42 y.o. male.  Patient is a 42 year old male with a history of TBI, schizoaffective disorder, diabetes, hypertension, alcohol abuse and being unhoused who is presenting today after being found unresponsive on the back of the bus.  EMS reports that patient has been somnolent but will respond to painful stimuli.  He has been spontaneously breathing but they noticed pinpoint pupils.  Looking through patient's bags he has multiple empty alcohol bottles.  There is no signs of trauma at the scene or other paraphernalia.  Patient is not able to answer any questions at this time.  The history is provided by the EMS personnel and medical records. The history is limited by the condition of the patient.       Home Medications Prior to Admission medications   Medication Sig Start Date End Date Taking? Authorizing Provider  acetaminophen (TYLENOL 8 HOUR) 650 MG CR tablet Take 1 tablet (650 mg total) by mouth every 8 (eight) hours as needed for pain. 06/19/22   Petrucelli, Glynda Jaeger, PA-C  clotrimazole (LOTRIMIN) 1 % cream Apply to affected area 2 times daily 09/30/22   Montine Circle, PA-C  loratadine (CLARITIN) 10 MG tablet Take 1 tablet (10 mg total) by mouth daily as needed for allergies or rhinitis. 06/19/22   Petrucelli, Glynda Jaeger, PA-C      Allergies    Shellfish allergy    Review of Systems   Review of Systems  Physical Exam Updated Vital Signs BP 95/67   Pulse (!) 58   Temp (!) 94.1 F (34.5 C) (Rectal)   Resp 14   SpO2 99%  Physical Exam Vitals and nursing note reviewed.  Constitutional:      General: He is not in acute distress.    Appearance: He is well-developed.     Comments: Somnolent but will arouse to painful stimuli  HENT:     Head: Normocephalic  and atraumatic.  Eyes:     Conjunctiva/sclera: Conjunctivae normal.     Comments: Pupils are 1 mm bilaterally  Cardiovascular:     Rate and Rhythm: Normal rate and regular rhythm.     Heart sounds: No murmur heard. Pulmonary:     Effort: Pulmonary effort is normal. No respiratory distress.     Breath sounds: Normal breath sounds. No wheezing or rales.     Comments: Spontaneously breathing and breath sounds are clear Abdominal:     General: There is no distension.     Palpations: Abdomen is soft.     Tenderness: There is no abdominal tenderness. There is no guarding or rebound.  Musculoskeletal:        General: No tenderness. Normal range of motion.     Cervical back: Normal range of motion and neck supple.     Comments: No evidence of trauma  Skin:    General: Skin is warm and dry.     Findings: No erythema or rash.  Neurological:     Comments: Responsive only to painful stimuli at this time.  Psychiatric:     Comments: Unresponsive     ED Results / Procedures / Treatments   Labs (all labs ordered are listed, but only abnormal results are displayed) Labs Reviewed  CBC WITH DIFFERENTIAL/PLATELET - Abnormal; Notable for the following  components:      Result Value   RBC 4.01 (*)    All other components within normal limits  COMPREHENSIVE METABOLIC PANEL - Abnormal; Notable for the following components:   Potassium 3.2 (*)    Glucose, Bld 101 (*)    Calcium 8.5 (*)    All other components within normal limits  ETHANOL - Abnormal; Notable for the following components:   Alcohol, Ethyl (B) 305 (*)    All other components within normal limits  CBG MONITORING, ED - Abnormal; Notable for the following components:   Glucose-Capillary 102 (*)    All other components within normal limits    EKG None  Radiology No results found.  Procedures Procedures    Medications Ordered in ED Medications  naloxone (NARCAN) injection 0.4 mg (0.4 mg Intravenous Given 02/10/23 1427)     ED Course/ Medical Decision Making/ A&P                             Medical Decision Making Amount and/or Complexity of Data Reviewed Labs: ordered. Radiology: ordered.  Risk Prescription drug management.   Pt with multiple medical problems and comorbidities and presenting today with a complaint that caries a high risk for morbidity and mortality.  Here today unresponsive.  Vital signs are reassuring.  Patient is in no acute distress.  Concern for substance use as the cause of his altered mental status.  Patient was given Narcan without any response.  Patient was found to have empty liquor bottles in his backpack and suggests intoxication.  Patient has no obvious signs of trauma and lower suspicion for head bleed or stroke.  I independently interpreted patient's labs shows a normal CBG, CBC without acute findings, CMP with minimal hypokalemia of 3.2 and an alcohol level of 305.  CT to ensure no other acute findings pending.  Patient will need to sober up and be reevaluated at that time.         Final Clinical Impression(s) / ED Diagnoses Final diagnoses:  None    Rx / DC Orders ED Discharge Orders     None         Blanchie Dessert, MD 02/10/23 1711

## 2023-02-12 ENCOUNTER — Other Ambulatory Visit: Payer: Self-pay

## 2023-02-12 ENCOUNTER — Emergency Department (HOSPITAL_COMMUNITY)
Admission: EM | Admit: 2023-02-12 | Discharge: 2023-02-12 | Disposition: A | Discharge status: Home / Self Care | Payer: Medicare (Managed Care) | Attending: Emergency Medicine | Admitting: Emergency Medicine

## 2023-02-12 DIAGNOSIS — Z5900 Homelessness unspecified: Secondary | ICD-10-CM | POA: Diagnosis not present

## 2023-02-12 DIAGNOSIS — E119 Type 2 diabetes mellitus without complications: Secondary | ICD-10-CM | POA: Diagnosis not present

## 2023-02-12 DIAGNOSIS — R5383 Other fatigue: Secondary | ICD-10-CM | POA: Insufficient documentation

## 2023-02-12 DIAGNOSIS — I10 Essential (primary) hypertension: Secondary | ICD-10-CM | POA: Insufficient documentation

## 2023-02-12 NOTE — Discharge Instructions (Signed)
The emergency department is not an appropriate place to seek shelter.  You will be given resources for homeless shelters in the [facility].

## 2023-02-12 NOTE — ED Triage Notes (Signed)
Pt arrived c/o of being tired. Pt stated he's been "walking a long time". Pt AO4, no difficulties ambulating.

## 2023-02-12 NOTE — ED Notes (Signed)
Patient verbalizes understanding of d/c instructions. Opportunities for questions and answers were provided. Pt d/c from ED and ambulated to lobby.
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
## 2023-02-13 ENCOUNTER — Other Ambulatory Visit: Payer: Self-pay

## 2023-02-13 ENCOUNTER — Emergency Department (HOSPITAL_COMMUNITY)
Admission: EM | Admit: 2023-02-13 | Discharge: 2023-02-13 | Disposition: A | Discharge status: Home / Self Care | Payer: Medicare (Managed Care) | Source: Home / Self Care | Attending: Emergency Medicine | Admitting: Emergency Medicine

## 2023-02-13 ENCOUNTER — Encounter (HOSPITAL_COMMUNITY): Payer: Self-pay

## 2023-02-13 ENCOUNTER — Emergency Department (HOSPITAL_COMMUNITY): Payer: Medicare (Managed Care)

## 2023-02-13 ENCOUNTER — Emergency Department (HOSPITAL_COMMUNITY)
Admission: EM | Admit: 2023-02-13 | Discharge: 2023-02-13 | Disposition: A | Discharge status: Home / Self Care | Payer: Medicare (Managed Care) | Attending: Emergency Medicine | Admitting: Emergency Medicine

## 2023-02-13 DIAGNOSIS — E1165 Type 2 diabetes mellitus with hyperglycemia: Secondary | ICD-10-CM | POA: Insufficient documentation

## 2023-02-13 DIAGNOSIS — R4182 Altered mental status, unspecified: Secondary | ICD-10-CM | POA: Diagnosis not present

## 2023-02-13 DIAGNOSIS — Z79899 Other long term (current) drug therapy: Secondary | ICD-10-CM | POA: Insufficient documentation

## 2023-02-13 DIAGNOSIS — M791 Myalgia, unspecified site: Secondary | ICD-10-CM | POA: Insufficient documentation

## 2023-02-13 DIAGNOSIS — M2550 Pain in unspecified joint: Secondary | ICD-10-CM | POA: Insufficient documentation

## 2023-02-13 DIAGNOSIS — W19XXXA Unspecified fall, initial encounter: Secondary | ICD-10-CM | POA: Insufficient documentation

## 2023-02-13 DIAGNOSIS — Z5321 Procedure and treatment not carried out due to patient leaving prior to being seen by health care provider: Secondary | ICD-10-CM

## 2023-02-13 LAB — BASIC METABOLIC PANEL
Anion gap: 8 (ref 5–15)
BUN: 9 mg/dL (ref 6–20)
CO2: 23 mmol/L (ref 22–32)
Calcium: 8.6 mg/dL — ABNORMAL LOW (ref 8.9–10.3)
Chloride: 103 mmol/L (ref 98–111)
Creatinine, Ser: 0.97 mg/dL (ref 0.61–1.24)
GFR, Estimated: >60 mL/min (ref >60)
Glucose, Bld: 94 mg/dL (ref 70–99)
Potassium: 3.5 mmol/L (ref 3.5–5.1)
Sodium: 134 mmol/L — ABNORMAL LOW (ref 135–145)

## 2023-02-13 LAB — CK: Total CK: 132 U/L (ref 49–397)

## 2023-02-13 MED ORDER — ACETAMINOPHEN 325 MG PO TABS
650.0000 mg | ORAL_TABLET | Freq: Once | ORAL | Status: AC
  Administered 2023-02-13: 650 mg via ORAL
  Filled 2023-02-13: qty 2

## 2023-02-13 MED ORDER — IBUPROFEN 200 MG PO TABS
400.0000 mg | ORAL_TABLET | Freq: Once | ORAL | Status: AC | PRN
  Administered 2023-02-13: 400 mg via ORAL
  Filled 2023-02-13: qty 2

## 2023-02-13 NOTE — ED Provider Notes (Signed)
[place] EMERGENCY DEPARTMENT AT [facility] Provider Note   CSN: [id number] Arrival date & time: 02/13/23  0121     History  Chief Complaint  Patient presents with   Joint Pain    Nathan Norton is a 41 y.o. male.  Patient with a history of homelessness, diabetes, alcohol abuse presenting with request for needing a place to sleep as well as needing food.  Frequent presentations for same.  He complains of pain to his hands and his knees which he states is an ongoing problem for him and he gets cold outside.  Denies any recent fall or trauma.  No fevers, chills, nausea or vomiting.  No chest pain or shortness of breath.  On arrival he is asking for food and stating that he needs a place to stay.  Frequent visits for same.  The history is provided by the patient.       Home Medications Prior to Admission medications   Medication Sig Start Date End Date Taking? Authorizing Provider  acetaminophen (TYLENOL 8 HOUR) 650 MG CR tablet Take 1 tablet (650 mg total) by mouth every 8 (eight) hours as needed for pain. 06/19/22   Petrucelli, Glynda Jaeger, PA-C  clotrimazole (LOTRIMIN) 1 % cream Apply to affected area 2 times daily 09/30/22   Montine Circle, PA-C  loratadine (CLARITIN) 10 MG tablet Take 1 tablet (10 mg total) by mouth daily as needed for allergies or rhinitis. 06/19/22   Petrucelli, Samantha R, PA-C      Allergies    Shellfish allergy    Review of Systems   Review of Systems  Constitutional:  Negative for activity change, appetite change and fever.  HENT:  Negative for congestion and rhinorrhea.   Respiratory:  Negative for chest tightness.   Cardiovascular:  Negative for chest pain.  Gastrointestinal:  Negative for abdominal pain, nausea and vomiting.  Musculoskeletal:  Positive for arthralgias and myalgias.  Skin:  Negative for rash.  Neurological:  Negative for dizziness, weakness and headaches.   all other systems are negative except as noted in the HPI  and PMH.    Physical Exam Updated Vital Signs BP 119/84   Pulse 84   Temp 98.6 F (37 C) (Oral)   Resp 12   Ht 6' 2"$  (1.88 m)   Wt 97 kg   SpO2 100%   BMI 27.46 kg/m  Physical Exam Vitals and nursing note reviewed.  Constitutional:      General: He is not in acute distress.    Appearance: He is well-developed.  HENT:     Head: Normocephalic and atraumatic.     Mouth/Throat:     Pharynx: No oropharyngeal exudate.  Eyes:     Conjunctiva/sclera: Conjunctivae normal.     Pupils: Pupils are equal, round, and reactive to light.  Neck:     Comments: No meningismus. Cardiovascular:     Rate and Rhythm: Normal rate and regular rhythm.     Heart sounds: Normal heart sounds. No murmur heard. Pulmonary:     Effort: Pulmonary effort is normal. No respiratory distress.     Breath sounds: Normal breath sounds.  Chest:     Chest wall: No tenderness.  Abdominal:     Palpations: Abdomen is soft.     Tenderness: There is no abdominal tenderness. There is no guarding or rebound.  Musculoskeletal:        General: No tenderness. Normal range of motion.     Cervical back: Normal range of motion  and neck supple.     Comments: FROM bilateral elbows, wrists, hands, elbows without warmth or erythema  Skin:    General: Skin is warm.  Neurological:     Mental Status: He is alert and oriented to person, place, and time.     Cranial Nerves: No cranial nerve deficit.     Motor: No abnormal muscle tone.     Coordination: Coordination normal.     Comments:  5/5 strength throughout. CN 2-12 intact.Equal grip strength.   Psychiatric:        Behavior: Behavior normal.     ED Results / Procedures / Treatments   Labs (all labs ordered are listed, but only abnormal results are displayed) Labs Reviewed  BASIC METABOLIC PANEL - Abnormal; Notable for the following components:      Result Value   Sodium 134 (*)    Calcium 8.6 (*)    All other components within normal limits  CK     EKG None  Radiology No results found.  Procedures Procedures    Medications Ordered in ED Medications  acetaminophen (TYLENOL) tablet 650 mg (has no administration in time range)  ibuprofen (ADVIL) tablet 400 mg (400 mg Oral Given 02/13/23 0137)    ED Course/ Medical Decision Making/ A&P                             Medical Decision Making Amount and/or Complexity of Data Reviewed Labs: ordered. Decision-making details documented in ED Course. Radiology: ordered and independent interpretation performed. Decision-making details documented in ED Course. ECG/medicine tests: ordered and independent interpretation performed. Decision-making details documented in ED Course.  Risk OTC drugs.   Pain to hands, knees and elbows.  No recent trauma.  No fever.  No evidence of septic joint.  Patient likely here for secondary gain including shelter and food.  Electrolytes are reassuring.  Blood glucose is normal.  Patient tolerated diet.  Low suspicion for emergent pathology tonight.  Patient will be allowed to rest and eat before being discharged.       Final Clinical Impression(s) / ED Diagnoses Final diagnoses:  None    Rx / DC Orders ED Discharge Orders     None         Maleya Leever, Annie Main, MD 02/13/23 [phone]

## 2023-02-13 NOTE — ED Triage Notes (Signed)
Pt arrived by EMS complaining of joint pain in hands and elbows. States it has been ongoing for a long time. Pt also asking for food.   CMS intact, pt able to moved all extremities

## 2023-02-13 NOTE — ED Notes (Signed)
Went into patient room to perform BGS, patient not in room. Iv catheter noticed laying on the side of the bed with a puddle of urine and sun flower seeds. Charge nurse notified of patient departure.

## 2023-02-13 NOTE — Discharge Instructions (Signed)
Use Tylenol as needed for pain.  Follow-up with your doctor.  Return to the ED with new or worsening symptoms

## 2023-02-13 NOTE — ED Notes (Signed)
Pt given biscuit and coke
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
## 2023-02-14 ENCOUNTER — Emergency Department (HOSPITAL_COMMUNITY)
Admission: EM | Admit: 2023-02-14 | Discharge: 2023-02-14 | Disposition: A | Discharge status: Home / Self Care | Payer: Medicare (Managed Care) | Attending: Emergency Medicine | Admitting: Emergency Medicine

## 2023-02-14 ENCOUNTER — Encounter (HOSPITAL_COMMUNITY): Payer: Self-pay

## 2023-02-14 DIAGNOSIS — F10120 Alcohol abuse with intoxication, uncomplicated: Secondary | ICD-10-CM | POA: Diagnosis present

## 2023-02-14 DIAGNOSIS — Z5900 Homelessness unspecified: Secondary | ICD-10-CM | POA: Diagnosis not present

## 2023-02-14 DIAGNOSIS — E119 Type 2 diabetes mellitus without complications: Secondary | ICD-10-CM | POA: Diagnosis not present

## 2023-02-14 DIAGNOSIS — F17210 Nicotine dependence, cigarettes, uncomplicated: Secondary | ICD-10-CM | POA: Diagnosis not present

## 2023-02-14 DIAGNOSIS — I10 Essential (primary) hypertension: Secondary | ICD-10-CM | POA: Insufficient documentation

## 2023-02-14 DIAGNOSIS — F10920 Alcohol use, unspecified with intoxication, uncomplicated: Secondary | ICD-10-CM

## 2023-02-14 MED ORDER — SODIUM CHLORIDE 0.9 % IV BOLUS: 1000.0000 mL | Freq: Once | INTRAVENOUS | Status: DC
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
## 2023-02-14 NOTE — ED Triage Notes (Signed)
Pt states he had been drinking. States he took drugs, but will not say what. He is alert to verbal communication. He was irritable about [place] putting him in a gown. But he was able to cooperate. Was seen here earlier in the day and went AMA. EMS states he had pinpoint pupils. No airway compromise.

## 2023-02-14 NOTE — ED Notes (Signed)
Pt refused labs and IV. States "they did that earlier today".

## 2023-02-14 NOTE — Discharge Instructions (Signed)
You were evaluated in the Emergency Department and after careful evaluation, we did not find any emergent condition requiring admission or further testing in the hospital.  Your exam/testing today was overall reassuring.  Please return to the Emergency Department if you experience any worsening of your condition.  Thank you for allowing us to be a part of your care.  

## 2023-02-14 NOTE — ED Provider Notes (Signed)
[facility] Provider Note   CSN: [id number] Arrival date & time: 02/13/23  2120     History  Chief Complaint  Patient presents with   Altered Mental Status    Nathan Norton is a 42 y.o. male.  Patient is a 42 year old male with a past medical history of alcohol use disorder, schizophrenia and diabetes presenting to the emergency department after being found down in a parking lot.  Bystanders called 911.  On medics arrival he was unresponsive on the floor with some cuts to his hand but no other signs of injury.  They did give him 1 dose of Narcan and the patient woke up stating that he did drink some alcohol tonight.  Patient states that he believes he may have fell but does not think that he hit his head or lost consciousness.  He denies any pain.  He states he did have several drinks tonight.  He denies any other drug use.  He denies any pain.  He denies any lightheadedness or dizziness.  The history is provided by the patient and the EMS personnel.  Altered Mental Status      Home Medications Prior to Admission medications   Medication Sig Start Date End Date Taking? Authorizing Provider  acetaminophen (TYLENOL 8 HOUR) 650 MG CR tablet Take 1 tablet (650 mg total) by mouth every 8 (eight) hours as needed for pain. 06/19/22   Petrucelli, Glynda Jaeger, PA-C  clotrimazole (LOTRIMIN) 1 % cream Apply to affected area 2 times daily 09/30/22   Montine Circle, PA-C  loratadine (CLARITIN) 10 MG tablet Take 1 tablet (10 mg total) by mouth daily as needed for allergies or rhinitis. 06/19/22   Petrucelli, Glynda Jaeger, PA-C      Allergies    Shellfish allergy    Review of Systems   Review of Systems  Physical Exam Updated Vital Signs BP 119/84 (BP Location: Left Wrist)   Pulse 84   Temp (!) 97.4 F (36.3 C)   Resp 16   Ht 6' 2"$  (1.88 m)   Wt 97 kg   SpO2 100%   BMI 27.46 kg/m  Physical Exam Vitals and nursing note reviewed.   Constitutional:      General: He is not in acute distress.    Appearance: Normal appearance.  HENT:     Head: Normocephalic and atraumatic.     Nose: Nose normal.     Mouth/Throat:     Mouth: Mucous membranes are moist.     Pharynx: Oropharynx is clear.  Eyes:     Extraocular Movements: Extraocular movements intact.     Conjunctiva/sclera: Conjunctivae normal.  Neck:     Comments: No midline neck tenderness Cardiovascular:     Rate and Rhythm: Normal rate and regular rhythm.     Heart sounds: Normal heart sounds.  Pulmonary:     Effort: Pulmonary effort is normal.     Breath sounds: Normal breath sounds.  Abdominal:     General: Abdomen is flat.     Palpations: Abdomen is soft.     Tenderness: There is no abdominal tenderness.  Musculoskeletal:        General: Normal range of motion.     Cervical back: Normal range of motion and neck supple.     Comments: No midline back tenderness No bony tenderness to bilateral upper or lower extremities Pelvis stable, nontender  Skin:    General: Skin is warm and dry.  Neurological:  General: No focal deficit present.     Mental Status: He is alert and oriented to person, place, and time.  Psychiatric:        Mood and Affect: Mood normal.        Behavior: Behavior normal.     ED Results / Procedures / Treatments   Labs (all labs ordered are listed, but only abnormal results are displayed) Labs Reviewed  CBG MONITORING, ED    EKG None  Radiology No results found.  Procedures Procedures    Medications Ordered in ED Medications - No data to display  ED Course/ Medical Decision Making/ A&P Clinical Course as of 02/14/23 0027  Thu Feb 13, 2023  2241 Patient eloped prior to completion of work up. [VK]    Clinical Course User Index [VK] Kemper Durie, DO                             Medical Decision Making This patient presents to the ED with chief complaint(s) of altered mental status/fall with pertinent  past medical history of alcohol use disorder, schizophrenia, diabetes which further complicates the presenting complaint. The complaint involves an extensive differential diagnosis and also carries with it a high risk of complications and morbidity.    The differential diagnosis includes ICH, mass effect, intoxication, hypo or hyperglycemia, arrhythmia, no other traumatic injuries seen on exam  Additional history obtained: Additional history obtained from EMS  Records reviewed recent ED records  ED Course and Reassessment: Due to patient suspected fall with intoxication, CT head will be performed to evaluate for ICH or mass effect.  Patient did admit to drinking alcohol tonight which is likely the cause of his mental status change however Accu-Chek will be performed to evaluate for hypo or hyperglycemia and determine if he needs further lab evaluation.  EKG will be performed to evaluate for arrhythmia.   Consideration for admission or further workup: Considered additional workup however patient eloped prior to completion of evaluation Social Determinants of health: Alcohol use disorder, schizophrenia, homelessness            Final Clinical Impression(s) / ED Diagnoses Final diagnoses:  None    Rx / DC Orders ED Discharge Orders     None         Kemper Durie, DO 02/14/23 0027

## 2023-02-14 NOTE — ED Notes (Addendum)
Went in to fix patient leads. Still sleeping. Pt urinated all over the floor.

## 2023-02-15 ENCOUNTER — Emergency Department (HOSPITAL_COMMUNITY)
Admission: EM | Admit: 2023-02-15 | Discharge: 2023-02-15 | Disposition: A | Discharge status: Home / Self Care | Payer: Medicare (Managed Care) | Attending: Emergency Medicine | Admitting: Emergency Medicine

## 2023-02-15 DIAGNOSIS — M79641 Pain in right hand: Secondary | ICD-10-CM | POA: Insufficient documentation

## 2023-02-15 DIAGNOSIS — M79642 Pain in left hand: Secondary | ICD-10-CM | POA: Insufficient documentation

## 2023-02-15 DIAGNOSIS — Z760 Encounter for issue of repeat prescription: Secondary | ICD-10-CM | POA: Insufficient documentation

## 2023-02-15 DIAGNOSIS — Z5900 Homelessness unspecified: Secondary | ICD-10-CM | POA: Insufficient documentation

## 2023-02-15 MED ORDER — CLOTRIMAZOLE 1 % EX CREA: TOPICAL_CREAM | CUTANEOUS | 0 refills | Status: DC

## 2023-02-15 MED ORDER — ACETAMINOPHEN 500 MG PO TABS
1000.0000 mg | ORAL_TABLET | Freq: Once | ORAL | Status: AC
  Administered 2023-02-15: 1000 mg via ORAL
  Filled 2023-02-15: qty 2

## 2023-02-15 NOTE — ED Triage Notes (Signed)
Patient requesting food /place to sleep. No distress. Respirations unlabored/ambulatory .

## 2023-02-15 NOTE — ED Provider Notes (Signed)
[facility] Provider Note   CSN: [id number] Arrival date & time: 02/15/23  0144     History  Chief Complaint  Patient presents with   Requesting: Food / Place to sleep     Danell Sisk is a 42 y.o. male.  The history is provided by the patient and medical records.    42 y.o. M with hx homelessness, presenting to the ED requesting some tylenol for aching hands, something to eat and drink.  States he also needs some lotrimin cream for his feet.    Well known to this ED, 66 visits in the past 6 months.  Home Medications Prior to Admission medications   Medication Sig Start Date End Date Taking? Authorizing Provider  acetaminophen (TYLENOL 8 HOUR) 650 MG CR tablet Take 1 tablet (650 mg total) by mouth every 8 (eight) hours as needed for pain. 06/19/22   Petrucelli, Glynda Jaeger, PA-C  clotrimazole (LOTRIMIN) 1 % cream Apply to affected area 2 times daily 09/30/22   Montine Circle, PA-C  loratadine (CLARITIN) 10 MG tablet Take 1 tablet (10 mg total) by mouth daily as needed for allergies or rhinitis. 06/19/22   Petrucelli, Glynda Jaeger, PA-C      Allergies    Shellfish allergy    Review of Systems   Review of Systems  Constitutional:        Homeless, hungry  All other systems reviewed and are negative.   Physical Exam Updated Vital Signs BP (!) 136/96   Pulse 88   Temp 98.3 F (36.8 C)   Resp 20   SpO2 99%   Physical Exam Vitals and nursing note reviewed.  Constitutional:      Appearance: He is well-developed.  HENT:     Head: Normocephalic and atraumatic.  Eyes:     Conjunctiva/sclera: Conjunctivae normal.     Pupils: Pupils are equal, round, and reactive to light.  Cardiovascular:     Rate and Rhythm: Normal rate and regular rhythm.     Heart sounds: Normal heart sounds.  Pulmonary:     Effort: Pulmonary effort is normal.     Breath sounds: Normal breath sounds.  Abdominal:     General: Bowel sounds are normal.      Palpations: Abdomen is soft.  Musculoskeletal:        General: Normal range of motion.     Cervical back: Normal range of motion.  Skin:    General: Skin is warm and dry.  Neurological:     Mental Status: He is alert and oriented to person, place, and time.     ED Results / Procedures / Treatments   Labs (all labs ordered are listed, but only abnormal results are displayed) Labs Reviewed - No data to display  EKG None  Radiology No results found.  Procedures Procedures    Medications Ordered in ED Medications  acetaminophen (TYLENOL) tablet 1,000 mg (has no administration in time range)    ED Course/ Medical Decision Making/ A&P                             Medical Decision Making Risk OTC drugs.   42 y.o. M presenting to the ED requesting a sandwich, tylenol, and refill of lotrimin cream for his feet.  He is well known to this facility, 66 visits in the past 6 months.  He was given meal, drink, dose of tylenol and script  as requested.  Do not feel he requires further work-up and is stable for discharge.  He can return here for new concerns.  Final Clinical Impression(s) / ED Diagnoses Final diagnoses:  Homeless    Rx / DC Orders ED Discharge Orders          Ordered    clotrimazole (LOTRIMIN) 1 % cream        02/15/23 0503              Larene Pickett, PA-C 02/15/23 [id number]    Ezequiel Essex, MD 02/15/23 [phone]

## 2023-02-18 ENCOUNTER — Emergency Department (HOSPITAL_COMMUNITY)
Admission: EM | Admit: 2023-02-18 | Discharge: 2023-02-19 | Disposition: A | Discharge status: Home / Self Care | Payer: Medicare (Managed Care) | Attending: Emergency Medicine | Admitting: Emergency Medicine

## 2023-02-18 ENCOUNTER — Encounter (HOSPITAL_COMMUNITY): Payer: Self-pay | Admitting: Emergency Medicine

## 2023-02-18 ENCOUNTER — Other Ambulatory Visit: Payer: Self-pay

## 2023-02-18 DIAGNOSIS — F10121 Alcohol abuse with intoxication delirium: Secondary | ICD-10-CM | POA: Insufficient documentation

## 2023-02-18 DIAGNOSIS — F10129 Alcohol abuse with intoxication, unspecified: Secondary | ICD-10-CM | POA: Diagnosis present

## 2023-02-18 DIAGNOSIS — F10921 Alcohol use, unspecified with intoxication delirium: Secondary | ICD-10-CM

## 2023-02-18 DIAGNOSIS — Z5900 Homelessness unspecified: Secondary | ICD-10-CM | POA: Insufficient documentation

## 2023-02-18 NOTE — ED Triage Notes (Signed)
Pt BIB EMS from walking down the road when he collapsed. Initially with fire, only response was to pain. ETOH on board.

## 2023-02-19 NOTE — ED Notes (Signed)
Pt refusing vitals at this time 

## 2023-02-19 NOTE — ED Provider Notes (Signed)
[facility] Provider Note   CSN: [id number] Arrival date & time: 02/18/23  2214     History {Add pertinent medical, surgical, social history, OB history to HPI:1} Chief Complaint  Patient presents with   Alcohol Intoxication    Nathan Norton is a 42 y.o. male.  Patient brought to the emergency department by EMS.  Patient reportedly had an episode where he elapsed prior to coming to the ED.  He is obviously intoxicated at arrival.       Home Medications Prior to Admission medications   Medication Sig Start Date End Date Taking? Authorizing Provider  acetaminophen (TYLENOL 8 HOUR) 650 MG CR tablet Take 1 tablet (650 mg total) by mouth every 8 (eight) hours as needed for pain. 06/19/22   Petrucelli, Glynda Jaeger, PA-C  clotrimazole (LOTRIMIN) 1 % cream Apply to affected area 2 times daily 02/15/23   Larene Pickett, PA-C  loratadine (CLARITIN) 10 MG tablet Take 1 tablet (10 mg total) by mouth daily as needed for allergies or rhinitis. 06/19/22   Petrucelli, Samantha R, PA-C      Allergies    Shellfish allergy    Review of Systems   Review of Systems  Physical Exam Updated Vital Signs BP 100/70   Pulse 93   Temp 98 F (36.7 C)   Resp 18   SpO2 98%  Physical Exam Vitals and nursing note reviewed.  Constitutional:      General: He is not in acute distress.    Appearance: He is well-developed.  HENT:     Head: Atraumatic.     Mouth/Throat:     Mouth: Mucous membranes are moist.  Eyes:     General: Vision grossly intact. Gaze aligned appropriately.     Extraocular Movements: Extraocular movements intact.     Conjunctiva/sclera: Conjunctivae normal.  Cardiovascular:     Rate and Rhythm: Normal rate and regular rhythm.     Pulses: Normal pulses.     Heart sounds: Normal heart sounds, S1 normal and S2 normal. No murmur heard.    No friction rub. No gallop.  Pulmonary:     Effort: Pulmonary effort is normal. No respiratory  distress.     Breath sounds: Normal breath sounds.  Abdominal:     Palpations: Abdomen is soft.     Tenderness: There is no abdominal tenderness. There is no guarding or rebound.     Hernia: No hernia is present.  Musculoskeletal:        General: No swelling.     Cervical back: Full passive range of motion without pain, normal range of motion and neck supple. No pain with movement, spinous process tenderness or muscular tenderness. Normal range of motion.     Right lower leg: No edema.     Left lower leg: No edema.  Skin:    General: Skin is warm and dry.     Capillary Refill: Capillary refill takes less than 2 seconds.     Findings: No ecchymosis, erythema, lesion or wound.  Neurological:     Mental Status: He is alert and oriented to person, place, and time.     GCS: GCS eye subscore is 4. GCS verbal subscore is 5. GCS motor subscore is 6.     Cranial Nerves: Cranial nerves 2-12 are intact.     Sensory: Sensation is intact.     Motor: Motor function is intact. No weakness or abnormal muscle tone.     Coordination:  Coordination is intact.  Psychiatric:        Mood and Affect: Mood normal.        Speech: Speech is slurred.        Behavior: Behavior normal.     ED Results / Procedures / Treatments   Labs (all labs ordered are listed, but only abnormal results are displayed) Labs Reviewed - No data to display  EKG None  Radiology No results found.  Procedures Procedures  {Document cardiac monitor, telemetry assessment procedure when appropriate:1}  Medications Ordered in ED Medications - No data to display  ED Course/ Medical Decision Making/ A&P   {   Click here for ABCD2, HEART and other calculatorsREFRESH Note before signing :1}                          Medical Decision Making  Patient well-known to this emergency department for frequent visits.  He is homeless and has a problem with alcoholism.  This is a typical visit for the patient.  He was allowed to sleep  overnight.  Repeat evaluation in the morning revealed no specific complaints, he was at baseline.  Appropriate for discharge.  {Document critical care time when appropriate:1} {Document review of labs and clinical decision tools ie heart score, Chads2Vasc2 etc:1}  {Document your independent review of radiology images, and any outside records:1} {Document your discussion with family members, caretakers, and with consultants:1} {Document social determinants of health affecting pt's care:1} {Document your decision making why or why not admission, treatments were needed:1} Final Clinical Impression(s) / ED Diagnoses Final diagnoses:  Alcohol intoxication with delirium ([place])    Rx / DC Orders ED Discharge Orders     None

## 2023-02-21 ENCOUNTER — Other Ambulatory Visit: Payer: Self-pay

## 2023-02-21 ENCOUNTER — Emergency Department (HOSPITAL_COMMUNITY)
Admission: EM | Admit: 2023-02-21 | Discharge: 2023-02-22 | Disposition: A | Discharge status: Home / Self Care | Payer: Medicare (Managed Care) | Attending: Emergency Medicine | Admitting: Emergency Medicine

## 2023-02-21 ENCOUNTER — Encounter (HOSPITAL_COMMUNITY): Payer: Self-pay

## 2023-02-21 DIAGNOSIS — F10920 Alcohol use, unspecified with intoxication, uncomplicated: Secondary | ICD-10-CM

## 2023-02-21 DIAGNOSIS — Z79899 Other long term (current) drug therapy: Secondary | ICD-10-CM | POA: Diagnosis not present

## 2023-02-21 DIAGNOSIS — F10120 Alcohol abuse with intoxication, uncomplicated: Secondary | ICD-10-CM | POA: Insufficient documentation

## 2023-02-21 DIAGNOSIS — Y908 Blood alcohol level of 240 mg/100 ml or more: Secondary | ICD-10-CM | POA: Insufficient documentation

## 2023-02-21 LAB — CBC WITH DIFFERENTIAL/PLATELET
Abs Immature Granulocytes: 0.02 10*3/uL (ref 0.00–0.07)
Basophils Absolute: 0 10*3/uL (ref 0.0–0.1)
Basophils Relative: 1 %
Eosinophils Absolute: 0.2 10*3/uL (ref 0.0–0.5)
Eosinophils Relative: 4 %
HCT: 40.1 % (ref 39.0–52.0)
Hemoglobin: 13.6 g/dL (ref 13.0–17.0)
Immature Granulocytes: 0 %
Lymphocytes Relative: 22 %
Lymphs Abs: 1.2 10*3/uL (ref 0.7–4.0)
MCH: 32.5 pg (ref 26.0–34.0)
MCHC: 33.9 g/dL (ref 30.0–36.0)
MCV: 95.9 fL (ref 80.0–100.0)
Monocytes Absolute: 0.4 10*3/uL (ref 0.1–1.0)
Monocytes Relative: 7 %
Neutro Abs: 3.6 10*3/uL (ref 1.7–7.7)
Neutrophils Relative %: 66 %
Platelets: 313 10*3/uL (ref 150–400)
RBC: 4.18 MIL/uL — ABNORMAL LOW (ref 4.22–5.81)
RDW: 15.9 % — ABNORMAL HIGH (ref 11.5–15.5)
WBC: 5.5 10*3/uL (ref 4.0–10.5)
nRBC: 0 % (ref 0.0–0.2)

## 2023-02-21 LAB — I-STAT CHEM 8, ED
BUN: 11 mg/dL (ref 6–20)
Calcium, Ion: 1.03 mmol/L — ABNORMAL LOW (ref 1.15–1.40)
Chloride: 105 mmol/L (ref 98–111)
Creatinine, Ser: 1.4 mg/dL — ABNORMAL HIGH (ref 0.61–1.24)
Glucose, Bld: 80 mg/dL (ref 70–99)
HCT: 40 % (ref 39.0–52.0)
Hemoglobin: 13.6 g/dL (ref 13.0–17.0)
Potassium: 3.6 mmol/L (ref 3.5–5.1)
Sodium: 140 mmol/L (ref 135–145)
TCO2: 24 mmol/L (ref 22–32)

## 2023-02-21 LAB — BASIC METABOLIC PANEL
Anion gap: 12 (ref 5–15)
BUN: 11 mg/dL (ref 6–20)
CO2: 22 mmol/L (ref 22–32)
Calcium: 8.9 mg/dL (ref 8.9–10.3)
Chloride: 103 mmol/L (ref 98–111)
Creatinine, Ser: 1.1 mg/dL (ref 0.61–1.24)
GFR, Estimated: >60 mL/min (ref >60)
Glucose, Bld: 91 mg/dL (ref 70–99)
Potassium: 3.7 mmol/L (ref 3.5–5.1)
Sodium: 137 mmol/L (ref 135–145)

## 2023-02-21 LAB — ETHANOL: Alcohol, Ethyl (B): 263 mg/dL — ABNORMAL HIGH (ref <10)

## 2023-02-21 MED ORDER — SODIUM CHLORIDE 0.9 % IV BOLUS
500.0000 mL | Freq: Once | INTRAVENOUS | Status: AC
  Administered 2023-02-21: 500 mL via INTRAVENOUS

## 2023-02-21 NOTE — ED Triage Notes (Addendum)
Patient was found unconscious on the road, bystander started CPR. Upon EMS arrival patient had a strong pulse, but was apneic. Patient got a total of '2mg'$  narcan and woke up more. Patient not making sense while speaking. BG 96 per EMS. Patient endorses EtOH.

## 2023-02-21 NOTE — ED Notes (Signed)
Pt resting in bed with no acute distress noted at this time.

## 2023-02-21 NOTE — ED Provider Notes (Signed)
[facility] Provider Note   CSN: [id number] Arrival date & time: 02/21/23  1758     History  Chief Complaint  Patient presents with   Drug Overdose    Nathan Norton is a 42 y.o. male.  42 year old male with prior medical history as detailed below presents for evaluation.  Patient was found unresponsive by bystanders.  Bystanders may have initiated CPR.  Patient was not pulseless with EMS.  Patient received 2 mg of Narcan from EMS.  Patient endorses significant EtOH intake today.  Patient denies other drug use today.  Patient alert and clearly intoxicated on arrival to the ED.  The history is provided by the patient and medical records.       Home Medications Prior to Admission medications   Medication Sig Start Date End Date Taking? Authorizing Provider  acetaminophen (TYLENOL 8 HOUR) 650 MG CR tablet Take 1 tablet (650 mg total) by mouth every 8 (eight) hours as needed for pain. 06/19/22   Petrucelli, Glynda Jaeger, PA-C  clotrimazole (LOTRIMIN) 1 % cream Apply to affected area 2 times daily 02/15/23   Larene Pickett, PA-C  loratadine (CLARITIN) 10 MG tablet Take 1 tablet (10 mg total) by mouth daily as needed for allergies or rhinitis. 06/19/22   Petrucelli, Glynda Jaeger, PA-C      Allergies    Shellfish allergy    Review of Systems   Review of Systems  All other systems reviewed and are negative.   Physical Exam Updated Vital Signs BP 118/67 (BP Location: Right Arm)   Pulse 80   Temp (!) 97.2 F (36.2 C) (Axillary)   Resp 16   SpO2 99%  Physical Exam Vitals and nursing note reviewed.  Constitutional:      General: He is not in acute distress.    Appearance: He is well-developed.     Comments: Alert, appears intoxicated  HENT:     Head: Normocephalic and atraumatic.  Eyes:     Conjunctiva/sclera: Conjunctivae normal.     Pupils: Pupils are equal, round, and reactive to light.  Cardiovascular:     Rate and  Rhythm: Normal rate and regular rhythm.     Heart sounds: Normal heart sounds.  Pulmonary:     Effort: Pulmonary effort is normal. No respiratory distress.     Breath sounds: Normal breath sounds.  Abdominal:     General: There is no distension.     Palpations: Abdomen is soft.     Tenderness: There is no abdominal tenderness.  Musculoskeletal:        General: No deformity. Normal range of motion.     Cervical back: Normal range of motion and neck supple.  Skin:    General: Skin is warm and dry.  Neurological:     General: No focal deficit present.     Mental Status: He is alert and oriented to person, place, and time.     ED Results / Procedures / Treatments   Labs (all labs ordered are listed, but only abnormal results are displayed) Labs Reviewed  CBC WITH DIFFERENTIAL/PLATELET - Abnormal; Notable for the following components:      Result Value   RBC 4.18 (*)    RDW 15.9 (*)    All other components within normal limits  ETHANOL - Abnormal; Notable for the following components:   Alcohol, Ethyl (B) 263 (*)    All other components within normal limits  I-STAT CHEM 8, ED - Abnormal;  Notable for the following components:   Creatinine, Ser 1.40 (*)    Calcium, Ion 1.03 (*)    All other components within normal limits  BASIC METABOLIC PANEL  RAPID URINE DRUG SCREEN, HOSP PERFORMED    EKG EKG Interpretation  Date/Time:  Friday February 21 2023 18:09:08 EST Ventricular Rate:  80 PR Interval:  142 QRS Duration: 100 QT Interval:  402 QTC Calculation: 464 R Axis:   76 Text Interpretation: Sinus rhythm ST elev, probable normal early repol pattern Confirmed by Dene Gentry [phone]) on 02/21/2023 6:20:26 PM  Radiology No results found.  Procedures Procedures    Medications Ordered in ED Medications  sodium chloride 0.9 % bolus 500 mL (500 mLs Intravenous New Bag/Given 02/21/23 1823)    ED Course/ Medical Decision Making/ A&P                             Medical  Decision Making Amount and/or Complexity of Data Reviewed Labs: ordered.    Medical Screen Complete  This patient presented to the ED with complaint of likely EtOH intoxication.  This complaint involves an extensive number of treatment options. The initial differential diagnosis includes, but is not limited to, EtOH intoxication  This presentation is: Acute, Chronic, Self-Limited, Previously Undiagnosed, Uncertain Prognosis, Complicated, Systemic Symptoms, and Threat to Life/Bodily Function  Patient presents with AMS.  Patient is clinically intoxicated on evaluation.  Patient's alcohol level was elevated to 63.  Patient will require ED observation until sober enough to safely leave the ED.  Oncoming EDP is aware of case and will reevaluate.  Additional history obtained:  External records from outside sources obtained and reviewed including prior ED visits and prior Inpatient records.    Lab Tests:  I ordered and personally interpreted labs.  The pertinent results include: CBC, BMP, i-STAT Chem-8, EtOH   Problem List / ED Course:  EtOH intoxication   Reevaluation:  After the interventions noted above, I reevaluated the patient and found that they have: improved  Disposition:  After consideration of the diagnostic results and the patients response to treatment, I feel that the patent would benefit from ED observation and reevaluation.          Final Clinical Impression(s) / ED Diagnoses Final diagnoses:  Alcoholic intoxication without complication [facility])    Rx / DC Orders ED Discharge Orders     None         Valarie Merino, MD 02/21/23 2322

## 2023-02-22 NOTE — ED Provider Notes (Signed)
Awake and alert.  Asked for something to drink and eat.  This was provided by me.     NCAT PERRL RRR CTAB NABS   Stable for discharge with close follow up.  Return for intractable cough, coughing up blood, fevers > 100.4 unrelieved by medication, shortness of breath, intractable vomiting, chest pain, shortness of breath, weakness, numbness, changes in speech, facial asymmetry, abdominal pain, passing out, Inability to tolerate liquids or food, cough, altered mental status or any concerns. No signs of systemic illness or infection. The patient is nontoxic-appearing on exam and vital signs are within normal limits.  I have reviewed the triage vital signs and the nursing notes. Pertinent labs & imaging results that were available during my care of the patient were reviewed by me and considered in my medical decision making (see chart for details). After history, exam, and medical workup I feel the patient has been appropriately medically screened and is safe for discharge home. Pertinent diagnoses were discussed with the patient. Patient was given return precautions.      Stacie Knutzen, MD 02/22/23 [phone]

## 2023-02-26 ENCOUNTER — Encounter (HOSPITAL_COMMUNITY): Payer: Self-pay | Admitting: Emergency Medicine

## 2023-02-26 ENCOUNTER — Emergency Department (HOSPITAL_COMMUNITY)
Admission: EM | Admit: 2023-02-26 | Discharge: 2023-02-26 | Disposition: A | Discharge status: Home / Self Care | Payer: Medicare (Managed Care) | Attending: Emergency Medicine | Admitting: Emergency Medicine

## 2023-02-26 ENCOUNTER — Other Ambulatory Visit: Payer: Self-pay

## 2023-02-26 DIAGNOSIS — T730XXA Starvation, initial encounter: Secondary | ICD-10-CM | POA: Diagnosis not present

## 2023-02-26 DIAGNOSIS — X58XXXA Exposure to other specified factors, initial encounter: Secondary | ICD-10-CM | POA: Diagnosis not present

## 2023-02-26 DIAGNOSIS — S61401A Unspecified open wound of right hand, initial encounter: Secondary | ICD-10-CM | POA: Insufficient documentation

## 2023-02-26 DIAGNOSIS — L089 Local infection of the skin and subcutaneous tissue, unspecified: Secondary | ICD-10-CM | POA: Diagnosis present

## 2023-02-26 MED ORDER — CEPHALEXIN 500 MG PO CAPS: 500.0000 mg | ORAL_CAPSULE | Freq: Four times a day (QID) | ORAL | 0 refills | Status: DC

## 2023-02-26 MED ORDER — CEPHALEXIN 250 MG PO CAPS
500.0000 mg | ORAL_CAPSULE | Freq: Once | ORAL | Status: AC
  Administered 2023-02-26: 500 mg via ORAL
  Filled 2023-02-26: qty 2

## 2023-02-26 NOTE — Discharge Instructions (Addendum)
Take the prescribed medication as directed.  Try to keep wound on your hand as clean as possible. Follow-up with wellness clinic. Return to the ED for new or worsening symptoms.

## 2023-02-26 NOTE — ED Triage Notes (Signed)
Pt states he has a skin infection on his leg and is hungry and tired.

## 2023-02-26 NOTE — ED Notes (Signed)
Called for room, no answer

## 2023-02-26 NOTE — ED Provider Notes (Signed)
[facility] Provider Note   CSN: [id number] Arrival date & time: 02/26/23  0112     History  Chief Complaint  Patient presents with   Recurrent Skin Infections   hunger    Nathan Norton is a 42 y.o. male.  The history is provided by the patient and medical records.   42 y.o. M here for evaluation of right hand infection.  States he got a wound on knuckle of right hand, today started to have some drainage.  Denies fever.  He is right hand dominant.  He reports putting vaseline on it prior to arrival.  Also requesting something to eat/drink.    Home Medications Prior to Admission medications   Medication Sig Start Date End Date Taking? Authorizing Provider  cephALEXin (KEFLEX) 500 MG capsule Take 1 capsule (500 mg total) by mouth 4 (four) times daily. 02/26/23  Yes Larene Pickett, PA-C  acetaminophen (TYLENOL 8 HOUR) 650 MG CR tablet Take 1 tablet (650 mg total) by mouth every 8 (eight) hours as needed for pain. 06/19/22   Petrucelli, Glynda Jaeger, PA-C  clotrimazole (LOTRIMIN) 1 % cream Apply to affected area 2 times daily 02/15/23   Larene Pickett, PA-C  loratadine (CLARITIN) 10 MG tablet Take 1 tablet (10 mg total) by mouth daily as needed for allergies or rhinitis. 06/19/22   Petrucelli, Samantha R, PA-C      Allergies    Shellfish allergy    Review of Systems   Review of Systems  Skin:  Positive for wound.  All other systems reviewed and are negative.   Physical Exam Updated Vital Signs BP (!) 148/108 (BP Location: Right Wrist)   Pulse 98   Temp 98.4 F (36.9 C) (Oral)   Resp 15   SpO2 100%   Physical Exam Vitals and nursing note reviewed.  Constitutional:      Appearance: He is well-developed.  HENT:     Head: Normocephalic and atraumatic.  Eyes:     Conjunctiva/sclera: Conjunctivae normal.     Pupils: Pupils are equal, round, and reactive to light.  Cardiovascular:     Rate and Rhythm: Normal rate and regular  rhythm.     Heart sounds: Normal heart sounds.  Pulmonary:     Effort: Pulmonary effort is normal.     Breath sounds: Normal breath sounds.  Abdominal:     General: Bowel sounds are normal.     Palpations: Abdomen is soft.  Musculoskeletal:        General: Normal range of motion.     Cervical back: Normal range of motion.     Comments: Wound over right 5th MCP joint, there is small amount of purulent drainage centrally, further can be expressed when pressure applied, no surrounding fluctuance, erythema, or induration, no tissue crepitus  Skin:    General: Skin is warm and dry.  Neurological:     Mental Status: He is alert and oriented to person, place, and time.     ED Results / Procedures / Treatments   Labs (all labs ordered are listed, but only abnormal results are displayed) Labs Reviewed - No data to display  EKG None  Radiology No results found.  Procedures Procedures    Medications Ordered in ED Medications  cephALEXin (KEFLEX) capsule 500 mg (has no administration in time range)    ED Course/ Medical Decision Making/ A&P  Medical Decision Making Risk Prescription drug management.   67 male presenting to the ED with wound of right hand.  On exam he has small wound to the right fifth MCP joint-- central puncture with drainage.  Further drainage expressed with pressure.  No surrounding erythema, induration, or tissue crepitus.  Does not have any apparent cellulitis.  No hx of MRSA.  Will start on keflex, first dose given here along with meal/drink.  Recommended to follow-up at wellness clinic.  Can return here for new concerns.  Final Clinical Impression(s) / ED Diagnoses Final diagnoses:  Open wound of right hand without foreign body, unspecified wound type, initial encounter    Rx / DC Orders ED Discharge Orders          Ordered    cephALEXin (KEFLEX) 500 MG capsule  4 times daily        02/26/23 0253               Larene Pickett, PA-C 02/26/23 0315    Maudie Flakes, MD 02/26/23 [phone]

## 2023-02-27 ENCOUNTER — Emergency Department (HOSPITAL_COMMUNITY): Payer: Medicare (Managed Care)

## 2023-02-27 ENCOUNTER — Other Ambulatory Visit: Payer: Self-pay

## 2023-02-27 ENCOUNTER — Emergency Department (HOSPITAL_COMMUNITY)
Admission: EM | Admit: 2023-02-27 | Discharge: 2023-02-27 | Disposition: A | Discharge status: Home / Self Care | Payer: Medicare (Managed Care) | Attending: Emergency Medicine | Admitting: Emergency Medicine

## 2023-02-27 DIAGNOSIS — T50904A Poisoning by unspecified drugs, medicaments and biological substances, undetermined, initial encounter: Secondary | ICD-10-CM | POA: Diagnosis not present

## 2023-02-27 DIAGNOSIS — R401 Stupor: Secondary | ICD-10-CM | POA: Diagnosis present

## 2023-02-27 LAB — CBC WITH DIFFERENTIAL/PLATELET
Abs Immature Granulocytes: 0.01 10*3/uL (ref 0.00–0.07)
Basophils Absolute: 0 10*3/uL (ref 0.0–0.1)
Basophils Relative: 1 %
Eosinophils Absolute: 0.2 10*3/uL (ref 0.0–0.5)
Eosinophils Relative: 5 %
HCT: 40.8 % (ref 39.0–52.0)
Hemoglobin: 13.1 g/dL (ref 13.0–17.0)
Immature Granulocytes: 0 %
Lymphocytes Relative: 16 %
Lymphs Abs: 0.7 10*3/uL (ref 0.7–4.0)
MCH: 32 pg (ref 26.0–34.0)
MCHC: 32.1 g/dL (ref 30.0–36.0)
MCV: 99.8 fL (ref 80.0–100.0)
Monocytes Absolute: 0.2 10*3/uL (ref 0.1–1.0)
Monocytes Relative: 5 %
Neutro Abs: 3.3 10*3/uL (ref 1.7–7.7)
Neutrophils Relative %: 73 %
Platelets: 291 10*3/uL (ref 150–400)
RBC: 4.09 MIL/uL — ABNORMAL LOW (ref 4.22–5.81)
RDW: 15.5 % (ref 11.5–15.5)
WBC: 4.5 10*3/uL (ref 4.0–10.5)
nRBC: 0 % (ref 0.0–0.2)

## 2023-02-27 LAB — COMPREHENSIVE METABOLIC PANEL
ALT: 23 U/L (ref 0–44)
AST: 28 U/L (ref 15–41)
Albumin: 3.5 g/dL (ref 3.5–5.0)
Alkaline Phosphatase: 85 U/L (ref 38–126)
Anion gap: 11 (ref 5–15)
BUN: 7 mg/dL (ref 6–20)
CO2: 22 mmol/L (ref 22–32)
Calcium: 8.6 mg/dL — ABNORMAL LOW (ref 8.9–10.3)
Chloride: 104 mmol/L (ref 98–111)
Creatinine, Ser: 1.02 mg/dL (ref 0.61–1.24)
GFR, Estimated: >60 mL/min (ref >60)
Glucose, Bld: 84 mg/dL (ref 70–99)
Potassium: 3.4 mmol/L — ABNORMAL LOW (ref 3.5–5.1)
Sodium: 137 mmol/L (ref 135–145)
Total Bilirubin: 0.5 mg/dL (ref 0.3–1.2)
Total Protein: 7.1 g/dL (ref 6.5–8.1)

## 2023-02-27 LAB — ETHANOL: Alcohol, Ethyl (B): 203 mg/dL — ABNORMAL HIGH (ref <10)

## 2023-02-27 MED ORDER — SODIUM CHLORIDE 0.9 % IV BOLUS
1000.0000 mL | Freq: Once | INTRAVENOUS | Status: AC
  Administered 2023-02-27: 1000 mL via INTRAVENOUS

## 2023-02-27 NOTE — ED Provider Notes (Signed)
Pt signed out by Dr. Vanita Panda pending symptomatic improvement.  Pt is now awake and alert.  Pt's nurse said he saw him ambulating.  He went to the bathroom and removed his own IV and walked out.  I did not get to talk to him before he left.   Isla Pence, MD 02/27/23 2048
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
## 2023-02-27 NOTE — ED Triage Notes (Addendum)
Pt found unresponsive on the side of the road with pinpoint pupils. Patient given narcan by EMS with improvement in respiratory status from 4 to 20 per min. Pt remains very lethargic and arrives on NRB. Denies SI.

## 2023-02-27 NOTE — ED Notes (Signed)
Pt was noted to be exiting through exit doors of the department, he was asked to go back to his room and he was unwilling to at that time. Pt left against medical advice

## 2023-02-27 NOTE — Discharge Instructions (Signed)
Please be sure to follow-up with your physician.  Return here for concerning changes in your condition. 

## 2023-02-27 NOTE — ED Notes (Signed)
Pt is ambulating to the bathroom without assistance

## 2023-03-05 ENCOUNTER — Emergency Department (HOSPITAL_COMMUNITY): Payer: Medicare (Managed Care)

## 2023-03-05 ENCOUNTER — Encounter (HOSPITAL_COMMUNITY): Payer: Self-pay | Admitting: Emergency Medicine

## 2023-03-05 ENCOUNTER — Emergency Department (HOSPITAL_COMMUNITY)
Admission: EM | Admit: 2023-03-05 | Discharge: 2023-03-05 | Disposition: A | Discharge status: Home / Self Care | Payer: Medicare (Managed Care) | Attending: Emergency Medicine | Admitting: Emergency Medicine

## 2023-03-05 ENCOUNTER — Other Ambulatory Visit: Payer: Self-pay

## 2023-03-05 DIAGNOSIS — X58XXXA Exposure to other specified factors, initial encounter: Secondary | ICD-10-CM | POA: Diagnosis not present

## 2023-03-05 DIAGNOSIS — E119 Type 2 diabetes mellitus without complications: Secondary | ICD-10-CM | POA: Diagnosis not present

## 2023-03-05 DIAGNOSIS — Z87820 Personal history of traumatic brain injury: Secondary | ICD-10-CM | POA: Insufficient documentation

## 2023-03-05 DIAGNOSIS — F10920 Alcohol use, unspecified with intoxication, uncomplicated: Secondary | ICD-10-CM

## 2023-03-05 DIAGNOSIS — F209 Schizophrenia, unspecified: Secondary | ICD-10-CM | POA: Insufficient documentation

## 2023-03-05 DIAGNOSIS — S0990XA Unspecified injury of head, initial encounter: Secondary | ICD-10-CM | POA: Insufficient documentation

## 2023-03-05 DIAGNOSIS — I10 Essential (primary) hypertension: Secondary | ICD-10-CM | POA: Insufficient documentation

## 2023-03-05 DIAGNOSIS — R4182 Altered mental status, unspecified: Secondary | ICD-10-CM | POA: Diagnosis not present

## 2023-03-05 LAB — COMPREHENSIVE METABOLIC PANEL
ALT: 25 U/L (ref 0–44)
AST: 28 U/L (ref 15–41)
Albumin: 3.3 g/dL — ABNORMAL LOW (ref 3.5–5.0)
Alkaline Phosphatase: 91 U/L (ref 38–126)
Anion gap: 15 (ref 5–15)
BUN: 10 mg/dL (ref 6–20)
CO2: 22 mmol/L (ref 22–32)
Calcium: 8.6 mg/dL — ABNORMAL LOW (ref 8.9–10.3)
Chloride: 99 mmol/L (ref 98–111)
Creatinine, Ser: 1.06 mg/dL (ref 0.61–1.24)
GFR, Estimated: >60 mL/min (ref >60)
Glucose, Bld: 101 mg/dL — ABNORMAL HIGH (ref 70–99)
Potassium: 4 mmol/L (ref 3.5–5.1)
Sodium: 136 mmol/L (ref 135–145)
Total Bilirubin: 0.8 mg/dL (ref 0.3–1.2)
Total Protein: 6.8 g/dL (ref 6.5–8.1)

## 2023-03-05 LAB — URINALYSIS, ROUTINE W REFLEX MICROSCOPIC
Bilirubin Urine: NEGATIVE
Glucose, UA: NEGATIVE mg/dL
Hgb urine dipstick: NEGATIVE
Ketones, ur: NEGATIVE mg/dL
Leukocytes,Ua: NEGATIVE
Nitrite: NEGATIVE
Protein, ur: NEGATIVE mg/dL
Specific Gravity, Urine: 1.002 — ABNORMAL LOW (ref 1.005–1.030)
pH: 5 (ref 5.0–8.0)

## 2023-03-05 LAB — CBC WITH DIFFERENTIAL/PLATELET
Abs Immature Granulocytes: 0.03 10*3/uL (ref 0.00–0.07)
Basophils Absolute: 0 10*3/uL (ref 0.0–0.1)
Basophils Relative: 1 %
Eosinophils Absolute: 0.3 10*3/uL (ref 0.0–0.5)
Eosinophils Relative: 4 %
HCT: 40.4 % (ref 39.0–52.0)
Hemoglobin: 12.9 g/dL — ABNORMAL LOW (ref 13.0–17.0)
Immature Granulocytes: 1 %
Lymphocytes Relative: 18 %
Lymphs Abs: 1.2 10*3/uL (ref 0.7–4.0)
MCH: 31.9 pg (ref 26.0–34.0)
MCHC: 31.9 g/dL (ref 30.0–36.0)
MCV: 99.8 fL (ref 80.0–100.0)
Monocytes Absolute: 0.6 10*3/uL (ref 0.1–1.0)
Monocytes Relative: 10 %
Neutro Abs: 4.3 10*3/uL (ref 1.7–7.7)
Neutrophils Relative %: 66 %
Platelets: 319 10*3/uL (ref 150–400)
RBC: 4.05 MIL/uL — ABNORMAL LOW (ref 4.22–5.81)
RDW: 15.8 % — ABNORMAL HIGH (ref 11.5–15.5)
WBC: 6.4 10*3/uL (ref 4.0–10.5)
nRBC: 0 % (ref 0.0–0.2)

## 2023-03-05 LAB — RAPID URINE DRUG SCREEN, HOSP PERFORMED
Amphetamines: NOT DETECTED
Barbiturates: NOT DETECTED
Benzodiazepines: NOT DETECTED
Cocaine: NOT DETECTED
Opiates: NOT DETECTED
Tetrahydrocannabinol: POSITIVE — AB

## 2023-03-05 LAB — ETHANOL: Alcohol, Ethyl (B): 208 mg/dL — ABNORMAL HIGH (ref <10)

## 2023-03-05 MED ORDER — SODIUM CHLORIDE 0.9 % IV BOLUS
1000.0000 mL | Freq: Once | INTRAVENOUS | Status: AC
  Administered 2023-03-05: 1000 mL via INTRAVENOUS

## 2023-03-05 MED ORDER — NALOXONE HCL 0.4 MG/ML IJ SOLN
0.2000 mg | Freq: Once | INTRAMUSCULAR | Status: AC
  Administered 2023-03-05: 0.2 mg via INTRAVENOUS
  Filled 2023-03-05: qty 1

## 2023-03-05 NOTE — ED Notes (Addendum)
PA Erskine Speed reports pt took out his own IV and was on the table next to his demographic stickers prior to his elopement.  Charge RN Arnell Sieving notified and aware as well.

## 2023-03-05 NOTE — ED Triage Notes (Addendum)
Pt BIB EMS from the side of the road sleeping, when woke up pt states he wants tylenol. ETOH on board.  Cbg 105

## 2023-03-05 NOTE — ED Notes (Signed)
Pt has a hatchet, hammer, and corkscrew with security.

## 2023-03-05 NOTE — ED Provider Notes (Signed)
Briefly, 42 year old male who presents to the emergency department via EMS after being found sleeping on the road. Per EMS, he told them he wanted tylenol so they brought him here. Endorsing alcohol use.  Intoxicated  Physical Exam  BP 113/83 (BP Location: Right Arm)   Pulse 72   Resp 18   Ht 6\' 2"  (1.88 m)   Wt 97 kg   SpO2 92%   BMI 27.46 kg/m   Physical Exam Vitals and nursing note reviewed.  HENT:     Head: Normocephalic and atraumatic.  Eyes:     General: No scleral icterus. Pulmonary:     Effort: Pulmonary effort is normal. No respiratory distress.  Skin:    Findings: No rash.  Neurological:     General: No focal deficit present.     Mental Status: He is alert.  Psychiatric:        Mood and Affect: Mood normal.        Behavior: Behavior normal.        Thought Content: Thought content normal.        Judgment: Judgment normal.    Procedures  Procedures  ED Course / MDM   Clinical Course as of 03/05/23 0645  Wed Mar 05, 2023  0636 CT Cervical Spine Wo Contrast [WF]    Clinical Course User Index [WF] Marcello Fennel, PA-C   Medical Decision Making Amount and/or Complexity of Data Reviewed Labs: ordered. Radiology: ordered. Decision-making details documented in ED Course.  Risk Prescription drug management.   Work up: Labs independently reviewed by me: ethanol 208, cmp unremarkable, cbc stable anemia. Imaging independently reviewed by me: CT head negative for acute bleed, CT C spine negative for acute fracture, subluxation. CXR 1 view with no acute findings.   He did receive 0.2mg  narcan and had mild response as he was 92% on room air, somnolent.   0700: I have rounded on this patient. He is sleeping. He wakes up with physical stimulus. Intoxicated. On 2LNC sat 98%. Will continue to allow him to MTF.  0825: awake, eating and drinking  0912: Patient eloped from the emergency department prior to me being able to give him his discharge paperwork.  He was  awake and alert, eating and drinking and ambulating without assistance.  He is schizoaffective, TBI.  Somewhat tangential in her conversation.  Appears to be at baseline.  Appropriate for discharge.        Mickie Hillier, PA-C 03/05/23 0913    Godfrey Pick, MD 03/10/23 [phone]

## 2023-03-05 NOTE — ED Provider Notes (Signed)
[facility] Provider Note   CSN: [id number] Arrival date & time: 03/05/23  0145     History  Chief Complaint  Patient presents with   Alcohol Intoxication    Nathan Norton is a 42 y.o. male.  HPI   Medical history including schizophrenia TBI hypertension diabetes alcohol dependency presents via EMS.  Apparently patient was found sleeping on the road, told EMS that he wanted Tylenol and was brought to the emergency department.  Patient would not respond, would roll over and said he drank alcohol.  Reviewed patient's chart is seen frequently for alcohol intoxication was most recently seen a few days ago, he presented somnolent, disconjugate gaze, lab work imaging was obtained at that time which was unremarkable he was allowed to metabolize him at discharge.  Home Medications Prior to Admission medications   Medication Sig Start Date End Date Taking? Authorizing Provider  acetaminophen (TYLENOL 8 HOUR) 650 MG CR tablet Take 1 tablet (650 mg total) by mouth every 8 (eight) hours as needed for pain. 06/19/22   Petrucelli, Samantha R, PA-C  cephALEXin (KEFLEX) 500 MG capsule Take 1 capsule (500 mg total) by mouth 4 (four) times daily. 02/26/23   Larene Pickett, PA-C  clotrimazole (LOTRIMIN) 1 % cream Apply to affected area 2 times daily 02/15/23   Larene Pickett, PA-C  loratadine (CLARITIN) 10 MG tablet Take 1 tablet (10 mg total) by mouth daily as needed for allergies or rhinitis. 06/19/22   Petrucelli, Glynda Jaeger, PA-C      Allergies    Shellfish allergy    Review of Systems   Review of Systems  Unable to perform ROS: Mental status change    Physical Exam Updated Vital Signs BP 113/83 (BP Location: Right Arm)   Pulse 75   Temp 98.8 F (37.1 C) (Oral)   Resp 20   Ht '6\' 2"'$  (1.88 m)   Wt 97 kg   SpO2 98%   BMI 27.46 kg/m  Physical Exam Vitals and nursing note reviewed.  Constitutional:      General: He is not in acute  distress.    Appearance: He is not ill-appearing.     Comments: Disheveled, unkept, but no acute distress.  HENT:     Head: Normocephalic and atraumatic.     Comments: No deformity of the head present no raccoon eyes or Battle sign noted.    Nose: No congestion.     Mouth/Throat:     Mouth: Mucous membranes are moist.     Pharynx: Oropharynx is clear.  Eyes:     Conjunctiva/sclera: Conjunctivae normal.     Comments: PERRLA, had disconjugate gaze.  Cardiovascular:     Rate and Rhythm: Normal rate and regular rhythm.     Pulses: Normal pulses.     Heart sounds: No murmur heard.    No friction rub. No gallop.  Pulmonary:     Effort: No respiratory distress.     Breath sounds: No wheezing, rhonchi or rales.  Abdominal:     Palpations: Abdomen is soft.     Tenderness: There is no abdominal tenderness. There is no right CVA tenderness or left CVA tenderness.  Musculoskeletal:     Comments: Spine was palpated was nontender to palpation no step-off deformities noted.  No pelvis instability no leg shortening.  Skin:    General: Skin is warm and dry.  Neurological:     Mental Status: He is alert.  Comments: Patient is somnolent, appears to be intoxicated, will respond to painful stimuli, he is maintaining his oral airway, difficult to fully evaluate neuroexam but there is no facial asymmetry, there appears to be no unilateral weakness, has purposeful movements.  Psychiatric:        Mood and Affect: Mood normal.     ED Results / Procedures / Treatments   Labs (all labs ordered are listed, but only abnormal results are displayed) Labs Reviewed  COMPREHENSIVE METABOLIC PANEL - Abnormal; Notable for the following components:      Result Value   Glucose, Bld 101 (*)    Calcium 8.6 (*)    Albumin 3.3 (*)    All other components within normal limits  ETHANOL - Abnormal; Notable for the following components:   Alcohol, Ethyl (B) 208 (*)    All other components within normal limits   CBC WITH DIFFERENTIAL/PLATELET - Abnormal; Notable for the following components:   RBC 4.05 (*)    Hemoglobin 12.9 (*)    RDW 15.8 (*)    All other components within normal limits  RAPID URINE DRUG SCREEN, HOSP PERFORMED  URINALYSIS, ROUTINE W REFLEX MICROSCOPIC    EKG None  Radiology CT Cervical Spine Wo Contrast  Result Date: 03/05/2023 CLINICAL DATA:  42 year old male found sleeping on the side of the road. Headache. EXAM: CT CERVICAL SPINE WITHOUT CONTRAST TECHNIQUE: Multidetector CT imaging of the cervical spine was performed without intravenous contrast. Multiplanar CT image reconstructions were also generated. RADIATION DOSE REDUCTION: This exam was performed according to the departmental dose-optimization program which includes automated exposure control, adjustment of the mA and/or kV according to patient size and/or use of iterative reconstruction technique. COMPARISON:  Head CT today.  Cervical spine CT 12/11/2021. FINDINGS: Alignment: Chronic straightening of cervical lordosis. Rightward flexion of the head, neck today. Cervicothoracic junction alignment is within normal limits. Bilateral posterior element alignment is within normal limits. 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 But visualized skull base is intact. No atlanto-occipital dissociation. Normal C1-C2 alignment. No acute osseous abnormality identified. Soft tissues and spinal canal: No prevertebral fluid or swelling. No visible canal hematoma. Necklace artifact and mild pharynx motion artifact. Disc levels: Stable since 2022. 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 Upper chest: Negative. IMPRESSION: 1. No acute traumatic injury identified in the cervical spine. 2. 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 Electronically Signed   By: Genevie Ann M.D.   On:  03/05/2023 06:31   DG Chest 1 View  Result Date: 03/05/2023 CLINICAL DATA:  42 year old male found sleeping on the side of the road. Headache, shortness of breath. EXAM: CHEST  1 VIEW COMPARISON:  Portable chest 07/30/2014. FINDINGS: Portable AP semi upright views at 0545 hours. Lower lung volumes compared to 2015. Normal cardiac size and mediastinal contours. Visualized tracheal air column is within normal limits. Allowing for portable technique the lungs are clear. No pneumothorax or pleural effusion. Chronic distal left clavicle fracture. No acute osseous abnormality identified. Negative visible bowel gas. IMPRESSION: No cardiopulmonary abnormality. Electronically Signed   By: Genevie Ann M.D.   On: 03/05/2023 06:27   CT Head Wo Contrast  Result Date: 03/05/2023 CLINICAL DATA:  42 year old male found sleeping on the side of the road. Headache. EXAM: CT HEAD WITHOUT CONTRAST TECHNIQUE: Contiguous axial images were obtained from the base of the skull through the vertex without  intravenous contrast. RADIATION DOSE REDUCTION: This exam was performed according to the departmental dose-optimization program which includes automated exposure control, adjustment of the mA and/or kV according to patient size and/or use of iterative reconstruction technique. COMPARISON:  Head CT 02/27/2023, 07/30/2014. Cervical spine CT today reported separately. FINDINGS: Brain: Chronic partially empty sella. Normal background brain volume. No midline shift, ventriculomegaly, mass effect, evidence of mass lesion, intracranial hemorrhage or evidence of cortically based acute infarction. Gray-white matter differentiation is within normal limits throughout the brain. Vascular: No suspicious intracranial vascular hyperdensity. Skull: Stable, intact. Sinuses/Orbits: Visualized paranasal sinuses and mastoids are stable and well aerated. Other: Disconjugate gaze similar to the prior exam. No acute orbit or scalp soft tissue finding. IMPRESSION:  1. 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 This is often a normal anatomic variant but can be associated with idiopathic intracranial hypertension (pseudotumor cerebri). 2. Otherwise stable and normal noncontrast CT appearance of the brain. Electronically Signed   By: Genevie Ann M.D.   On: 03/05/2023 06:26    Procedures Procedures    Medications Ordered in ED Medications  sodium chloride 0.9 % bolus 1,000 mL (0 mLs Intravenous Stopped 03/05/23 0617)  naloxone [facility]) injection 0.2 mg (0.2 mg Intravenous Given 03/05/23 0509)    ED Course/ Medical Decision Making/ A&P Clinical Course as of 03/05/23 0650  Wed Mar 05, 2023  0636 CT Cervical Spine Wo Contrast [WF]    Clinical Course User Index [WF] Marcello Fennel, PA-C                             Medical Decision Making Amount and/or Complexity of Data Reviewed Labs: ordered. Radiology: ordered.  Risk Prescription drug management.   This patient presents to the ED for concern of alcohol intoxication, this involves an extensive number of treatment options, and is a complaint that carries with it a high risk of complications and morbidity.  The differential diagnosis includes metabolic derailment, psychiatric emergency, withdraws    Additional history obtained:  Additional history obtained from EMS External records from outside source obtained and reviewed including recent ER note    Co morbidities that complicate the patient evaluation  Psychiatric disorder  Social Determinants of Health:  Homelessness    Lab Tests:  I Ordered, and personally interpreted labs.  The pertinent results include: CBC shows normocytic anemia hemoglobin 12.9, CMP shows glucose of 101, calcium 8.6, albumin 3.3, ethanol 2.8   Imaging Studies ordered:  I ordered imaging studies including CT head, C-spine, chest x-ray I independently visualized and interpreted imaging which showed CT head, chest x-ray both negative for  acute findings, CT C-spine negative for acute findings I agree with the radiologist interpretation   Cardiac Monitoring:  The patient was maintained on a cardiac monitor.  I personally viewed and interpreted the cardiac monitored which showed an underlying rhythm of: EKG without signs of ischemia   Medicines ordered and prescription drug management:  I ordered medication including fluids, Narcan I have reviewed the patients home medicines and have made adjustments as needed  Critical Interventions:  N/A   Reevaluation:  Presents somnolent, but protecting airway, suspect intoxication, but due to his inability to communicate, and somnolent presentation will obtain basic lab workup imaging and continue to monitor.  Will also try some Narcan.  Patient was given Narcan, appears to be more responsive, but still refuses to communicate.  Will continue to monitor.    Consultations Obtained:  N/a    Test Considered:  CT chest abdomen pelvis-deferred as my suspicion for trauma of the chest or abdomen is low at this time there is no evident trauma noted, both of which are nontender my evaluation.    Rule out low suspicion for intracranial head bleed CT head is negative.  Patient did present with altered mental status but I suspect this is secondary due to polysubstance is endorsing alcohol use he has not felt to wait, and likely opiates as he responded to Narcan.   low suspicion for spinal cord abnormality or spinal fracture spine was palpated was nontender to palpation, he is moving his upper lower extremities without difficulty, CT C-spine is negative. low suspicion for pneumothorax as lung sounds are clear bilaterally, x-ray is negative for acute findings.     Dispostion and problem list  Due to shift change patient may handoff to [facility]  Allow patient to metabolize to baseline and likely can be discharged home.            Final Clinical Impression(s) / ED  Diagnoses Final diagnoses:  Alcoholic intoxication without complication [facility])    Rx / DC Orders ED Discharge Orders     None         Marcello Fennel, PA-C 03/05/23 0650    Godfrey Pick, MD 03/05/23 [phone]

## 2023-03-05 NOTE — ED Notes (Signed)
Pt mobile called as he just left the emergency department without being discharged.  No answer.

## 2023-03-06 ENCOUNTER — Other Ambulatory Visit: Payer: Self-pay

## 2023-03-06 ENCOUNTER — Emergency Department (HOSPITAL_COMMUNITY)
Admission: EM | Admit: 2023-03-06 | Discharge: 2023-03-06 | Disposition: A | Discharge status: Home / Self Care | Payer: Medicare (Managed Care) | Attending: Emergency Medicine | Admitting: Emergency Medicine

## 2023-03-06 ENCOUNTER — Encounter (HOSPITAL_COMMUNITY): Payer: Self-pay | Admitting: *Deleted

## 2023-03-06 DIAGNOSIS — Z5900 Homelessness unspecified: Secondary | ICD-10-CM | POA: Insufficient documentation

## 2023-03-06 DIAGNOSIS — M79641 Pain in right hand: Secondary | ICD-10-CM | POA: Diagnosis present

## 2023-03-06 NOTE — ED Triage Notes (Signed)
The pt is homeless  he is here almost every night.  His complaint is pain in both hands ?? He also wants socks and food

## 2023-03-06 NOTE — ED Provider Notes (Signed)
[facility] Provider Note   CSN: [id number] Arrival date & time: 03/06/23  0120     History  Chief Complaint  Patient presents with   Hand Pain    Nathan Norton is a 42 y.o. male.  The history is provided by the patient.  Patient presents for his 68th ER visit in 6 months.  Says he has pain in his hands.  Also requesting food     Home Medications Prior to Admission medications   Medication Sig Start Date End Date Taking? Authorizing Provider  acetaminophen (TYLENOL 8 HOUR) 650 MG CR tablet Take 1 tablet (650 mg total) by mouth every 8 (eight) hours as needed for pain. 06/19/22   Petrucelli, Samantha R, PA-C  cephALEXin (KEFLEX) 500 MG capsule Take 1 capsule (500 mg total) by mouth 4 (four) times daily. 02/26/23   Larene Pickett, PA-C  clotrimazole (LOTRIMIN) 1 % cream Apply to affected area 2 times daily 02/15/23   Larene Pickett, PA-C  loratadine (CLARITIN) 10 MG tablet Take 1 tablet (10 mg total) by mouth daily as needed for allergies or rhinitis. 06/19/22   Petrucelli, Glynda Jaeger, PA-C      Allergies    Shellfish allergy    Review of Systems   Review of Systems  Physical Exam Updated Vital Signs BP 134/81 (BP Location: Left Arm)   Pulse (!) 106   Temp 98.7 F (37.1 C) (Oral)   Resp 16   Ht 1.88 m ('6\' 2"'$ )   Wt 104.3 kg   SpO2 98%   BMI 29.53 kg/m  Physical Exam CONSTITUTIONAL: Disheveled, wearing sunglasses, eating food HEAD: Normocephalic/atraumatic EYES: Wearing sunglasses NEURO: Pt is awake/alert/appropriate, moves all extremitiesx4.  No facial droop.   EXTREMITIES: Able to make fist with both hands.  Small wound that appears to be healing on his right hand.  No erythema or active drainage SKIN: warm, color normal  ED Results / Procedures / Treatments   Labs (all labs ordered are listed, but only abnormal results are displayed) Labs Reviewed - No data to display  EKG None  Radiology CT Cervical Spine Wo  Contrast  Result Date: 03/05/2023 CLINICAL DATA:  42 year old male found sleeping on the side of the road. Headache. EXAM: CT CERVICAL SPINE WITHOUT CONTRAST TECHNIQUE: Multidetector CT imaging of the cervical spine was performed without intravenous contrast. Multiplanar CT image reconstructions were also generated. RADIATION DOSE REDUCTION: This exam was performed according to the departmental dose-optimization program which includes automated exposure control, adjustment of the mA and/or kV according to patient size and/or use of iterative reconstruction technique. COMPARISON:  Head CT today.  Cervical spine CT 12/11/2021. FINDINGS: Alignment: Chronic straightening of cervical lordosis. Rightward flexion of the head, neck today. Cervicothoracic junction alignment is within normal limits. Bilateral posterior element alignment is within normal limits. 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 But visualized skull base is intact. No atlanto-occipital dissociation. Normal C1-C2 alignment. No acute osseous abnormality identified. Soft tissues and spinal canal: No prevertebral fluid or swelling. No visible canal hematoma. Necklace artifact and mild pharynx motion artifact. Disc levels: Stable since 2022. 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 Upper chest: Negative. IMPRESSION: 1. No acute traumatic injury identified in the cervical spine. 2. 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 Electronically Signed   By: Genevie Ann M.D.   On:  03/05/2023 06:31   DG Chest 1 View  Result Date: 03/05/2023 CLINICAL DATA:  42 year old male found sleeping on the side of the road. Headache, shortness of breath. EXAM: CHEST  1 VIEW COMPARISON:  Portable chest 07/30/2014. FINDINGS: Portable AP semi upright views at 0545 hours. Lower lung volumes compared to 2015.  Normal cardiac size and mediastinal contours. Visualized tracheal air column is within normal limits. Allowing for portable technique the lungs are clear. No pneumothorax or pleural effusion. Chronic distal left clavicle fracture. No acute osseous abnormality identified. Negative visible bowel gas. IMPRESSION: No cardiopulmonary abnormality. Electronically Signed   By: Genevie Ann M.D.   On: 03/05/2023 06:27   CT Head Wo Contrast  Result Date: 03/05/2023 CLINICAL DATA:  42 year old male found sleeping on the side of the road. Headache. EXAM: CT HEAD WITHOUT CONTRAST TECHNIQUE: Contiguous axial images were obtained from the base of the skull through the vertex without intravenous contrast. RADIATION DOSE REDUCTION: This exam was performed according to the departmental dose-optimization program which includes automated exposure control, adjustment of the mA and/or kV according to patient size and/or use of iterative reconstruction technique. COMPARISON:  Head CT 02/27/2023, 07/30/2014. Cervical spine CT today reported separately. FINDINGS: Brain: Chronic partially empty sella. Normal background brain volume. No midline shift, ventriculomegaly, mass effect, evidence of mass lesion, intracranial hemorrhage or evidence of cortically based acute infarction. Gray-white matter differentiation is within normal limits throughout the brain. Vascular: No suspicious intracranial vascular hyperdensity. Skull: Stable, intact. Sinuses/Orbits: Visualized paranasal sinuses and mastoids are stable and well aerated. Other: Disconjugate gaze similar to the prior exam. No acute orbit or scalp soft tissue finding. IMPRESSION: 1. 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 This is often a normal anatomic variant but can be associated with idiopathic intracranial hypertension (pseudotumor cerebri). 2. Otherwise stable and normal noncontrast CT appearance of the brain. Electronically Signed   By: Genevie Ann M.D.   On: 03/05/2023  06:26    Procedures Procedures    Medications Ordered in ED Medications - No data to display  ED Course/ Medical Decision Making/ A&P                             Medical Decision Making          Final Clinical Impression(s) / ED Diagnoses Final diagnoses:  Right hand pain    Rx / DC Orders ED Discharge Orders     None         Ripley Fraise, MD 03/06/23 0210

## 2023-03-06 NOTE — ED Notes (Signed)
Patient verbalizes understanding of discharge instructions. Opportunity for questioning and answers were provided. Armband removed by staff, pt discharged from ED. Pt ambulatory to ED waiting room with steady gait.  

## 2023-03-17 ENCOUNTER — Other Ambulatory Visit: Payer: Self-pay

## 2023-03-17 ENCOUNTER — Encounter (HOSPITAL_COMMUNITY): Payer: Self-pay

## 2023-03-17 ENCOUNTER — Emergency Department (HOSPITAL_COMMUNITY)
Admission: EM | Admit: 2023-03-17 | Discharge: 2023-03-17 | Disposition: A | Discharge status: Home / Self Care | Payer: Medicare (Managed Care) | Attending: Emergency Medicine | Admitting: Emergency Medicine

## 2023-03-17 DIAGNOSIS — Z79899 Other long term (current) drug therapy: Secondary | ICD-10-CM

## 2023-03-17 DIAGNOSIS — R21 Rash and other nonspecific skin eruption: Secondary | ICD-10-CM | POA: Insufficient documentation

## 2023-03-17 MED ORDER — CLOTRIMAZOLE 1 % EX CREA: TOPICAL_CREAM | CUTANEOUS | 1 refills | Status: DC
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
## 2023-03-17 NOTE — Discharge Instructions (Signed)
Take the prescribed medication as directed. °Follow-up with your primary care doctor. °Return to the ED for new or worsening symptoms. °
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
## 2023-03-20 ENCOUNTER — Emergency Department (HOSPITAL_COMMUNITY)
Admission: EM | Admit: 2023-03-20 | Discharge: 2023-03-20 | Disposition: A | Discharge status: Home / Self Care | Payer: Medicare (Managed Care) | Attending: Emergency Medicine | Admitting: Emergency Medicine

## 2023-03-20 DIAGNOSIS — M79605 Pain in left leg: Secondary | ICD-10-CM | POA: Diagnosis not present

## 2023-03-20 DIAGNOSIS — M549 Dorsalgia, unspecified: Secondary | ICD-10-CM | POA: Diagnosis not present

## 2023-03-20 DIAGNOSIS — M791 Myalgia, unspecified site: Secondary | ICD-10-CM | POA: Diagnosis not present

## 2023-03-20 DIAGNOSIS — M79602 Pain in left arm: Secondary | ICD-10-CM | POA: Insufficient documentation

## 2023-03-20 DIAGNOSIS — M79604 Pain in right leg: Secondary | ICD-10-CM | POA: Insufficient documentation

## 2023-03-20 DIAGNOSIS — M79601 Pain in right arm: Secondary | ICD-10-CM | POA: Insufficient documentation

## 2023-03-20 LAB — BASIC METABOLIC PANEL
Anion gap: 12 (ref 5–15)
BUN: 14 mg/dL (ref 6–20)
CO2: 23 mmol/L (ref 22–32)
Calcium: 9.1 mg/dL (ref 8.9–10.3)
Chloride: 102 mmol/L (ref 98–111)
Creatinine, Ser: 1.09 mg/dL (ref 0.61–1.24)
GFR, Estimated: >60 mL/min (ref >60)
Glucose, Bld: 92 mg/dL (ref 70–99)
Potassium: 3.9 mmol/L (ref 3.5–5.1)
Sodium: 137 mmol/L (ref 135–145)

## 2023-03-20 LAB — CBC WITH DIFFERENTIAL/PLATELET
Abs Immature Granulocytes: 0.03 10*3/uL (ref 0.00–0.07)
Basophils Absolute: 0 10*3/uL (ref 0.0–0.1)
Basophils Relative: 0 %
Eosinophils Absolute: 0.3 10*3/uL (ref 0.0–0.5)
Eosinophils Relative: 3 %
HCT: 38.9 % — ABNORMAL LOW (ref 39.0–52.0)
Hemoglobin: 12.9 g/dL — ABNORMAL LOW (ref 13.0–17.0)
Immature Granulocytes: 0 %
Lymphocytes Relative: 8 %
Lymphs Abs: 0.7 10*3/uL (ref 0.7–4.0)
MCH: 32.3 pg (ref 26.0–34.0)
MCHC: 33.2 g/dL (ref 30.0–36.0)
MCV: 97.5 fL (ref 80.0–100.0)
Monocytes Absolute: 0.6 10*3/uL (ref 0.1–1.0)
Monocytes Relative: 7 %
Neutro Abs: 7.5 10*3/uL (ref 1.7–7.7)
Neutrophils Relative %: 82 %
Platelets: 291 10*3/uL (ref 150–400)
RBC: 3.99 MIL/uL — ABNORMAL LOW (ref 4.22–5.81)
RDW: 15.3 % (ref 11.5–15.5)
WBC: 9.2 10*3/uL (ref 4.0–10.5)
nRBC: 0 % (ref 0.0–0.2)

## 2023-03-20 LAB — ETHANOL: Alcohol, Ethyl (B): <10 mg/dL (ref <10)

## 2023-03-20 LAB — CK: Total CK: 289 U/L (ref 49–397)
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
## 2023-03-20 NOTE — Discharge Instructions (Signed)
Reduce your alcohol take.  Your blood work today is normal.  Keep yourself hydrated.  Follow-up with your primary doctor.  Return to the ED with new or worsening symptoms.

## 2023-03-20 NOTE — ED Provider Notes (Signed)
[facility] Provider Note   CSN: [id number] Arrival date & time: 03/20/23  [id number]     History  Chief Complaint  Patient presents with   Gen. Body Aches    Nathan Norton is a 42 y.o. male.  Patient well known to the ED.  Complaining of pain all over involving his back, arms and legs after walking all night.  No fall or injury.  He complains of pain to his arms and his legs and his back.  No focal weakness, numbness or tingling.  No bowel or bladder incontinence.  No fever or vomiting.  No history of IV drug abuse or cancer.  He is requesting food.  No chest pain or shortness of breath.  The history is provided by the patient.       Home Medications Prior to Admission medications   Medication Sig Start Date End Date Taking? Authorizing Provider  acetaminophen (TYLENOL 8 HOUR) 650 MG CR tablet Take 1 tablet (650 mg total) by mouth every 8 (eight) hours as needed for pain. 06/19/22   Petrucelli, Samantha R, PA-C  cephALEXin (KEFLEX) 500 MG capsule Take 1 capsule (500 mg total) by mouth 4 (four) times daily. 02/26/23   Larene Pickett, PA-C  clotrimazole (LOTRIMIN) 1 % cream Apply to affected area 2 times daily 03/17/23   Larene Pickett, PA-C  loratadine (CLARITIN) 10 MG tablet Take 1 tablet (10 mg total) by mouth daily as needed for allergies or rhinitis. 06/19/22   Petrucelli, Samantha R, PA-C      Allergies    Shellfish allergy    Review of Systems   Review of Systems  Constitutional:  Negative for activity change, appetite change and fever.  HENT:  Negative for congestion and rhinorrhea.   Respiratory:  Negative for cough, chest tightness and shortness of breath.   Cardiovascular:  Negative for chest pain.  Gastrointestinal:  Negative for abdominal pain, nausea and vomiting.  Genitourinary:  Negative for dysuria.  Musculoskeletal:  Positive for arthralgias, back pain and myalgias.  Skin:  Negative for rash.  Neurological:  Negative  for dizziness, weakness and headaches.   all other systems are negative except as noted in the HPI and PMH.    Physical Exam Updated Vital Signs BP 127/82 (BP Location: Right Arm)   Pulse 69   Temp 98.5 F (36.9 C)   Resp 20   SpO2 98%  Physical Exam Vitals and nursing note reviewed.  Constitutional:      General: He is not in acute distress.    Appearance: He is well-developed.     Comments: Disheveled.   HENT:     Head: Normocephalic and atraumatic.     Mouth/Throat:     Pharynx: No oropharyngeal exudate.  Eyes:     Conjunctiva/sclera: Conjunctivae normal.     Pupils: Pupils are equal, round, and reactive to light.  Neck:     Comments: No meningismus. Cardiovascular:     Rate and Rhythm: Normal rate and regular rhythm.     Heart sounds: Normal heart sounds. No murmur heard. Pulmonary:     Effort: Pulmonary effort is normal. No respiratory distress.     Breath sounds: Normal breath sounds.  Abdominal:     Palpations: Abdomen is soft.     Tenderness: There is no abdominal tenderness. There is no guarding or rebound.  Musculoskeletal:        General: No tenderness. Normal range of motion.  Cervical back: Normal range of motion and neck supple.     Comments: Full range of motion of bilateral hips, knees, ankles, shoulders, elbows.  No warmth or erythema.  Skin:    General: Skin is warm.  Neurological:     Mental Status: He is alert and oriented to person, place, and time.     Cranial Nerves: No cranial nerve deficit.     Motor: No abnormal muscle tone.     Coordination: Coordination normal.     Comments:  5/5 strength throughout. CN 2-12 intact.Equal grip strength.   Psychiatric:        Behavior: Behavior normal.     ED Results / Procedures / Treatments   Labs (all labs ordered are listed, but only abnormal results are displayed) Labs Reviewed  CBC WITH DIFFERENTIAL/PLATELET - Abnormal; Notable for the following components:      Result Value   RBC 3.99 (*)     Hemoglobin 12.9 (*)    HCT 38.9 (*)    All other components within normal limits  BASIC METABOLIC PANEL  ETHANOL  CK    EKG None  Radiology No results found.  Procedures Procedures    Medications Ordered in ED Medications - No data to display  ED Course/ Medical Decision Making/ A&P                             Medical Decision Making Amount and/or Complexity of Data Reviewed Labs: ordered. Decision-making details documented in ED Course. Radiology: ordered and independent interpretation performed. Decision-making details documented in ED Course. ECG/medicine tests: ordered and independent interpretation performed. Decision-making details documented in ED Course.   Patient well-known to the ED here with bodyaches after walking all night as well as back pain, arm pain and leg pain.  Vitals are stable, no distress, neurovascularly intact.  Low suspicion for cord compression or cauda equina.  Will check blood sugar, patient will be allowed to eat.  Labs reassuring.  Electrolytes are normal.  CBG is normal and CK is normal.  Patient given p.o. fluids as well as food he is able to ambulate.  Low suspicion for emergent pathology.  He appears to be at his baseline.  Stable for discharge and outpatient follow-up. Return precautions discussed.         Final Clinical Impression(s) / ED Diagnoses Final diagnoses:  Myalgia    Rx / DC Orders ED Discharge Orders     None         Savaya Hakes, Annie Main, MD 03/20/23 [phone]

## 2023-04-01 DIAGNOSIS — R7303 Prediabetes: Secondary | ICD-10-CM | POA: Insufficient documentation

## 2023-04-08 ENCOUNTER — Encounter (HOSPITAL_COMMUNITY): Payer: Self-pay

## 2023-04-08 ENCOUNTER — Emergency Department (HOSPITAL_COMMUNITY)
Admission: EM | Admit: 2023-04-08 | Discharge: 2023-04-08 | Disposition: A | Discharge status: Home / Self Care | Payer: Medicare (Managed Care) | Attending: Emergency Medicine | Admitting: Emergency Medicine

## 2023-04-08 ENCOUNTER — Other Ambulatory Visit: Payer: Self-pay

## 2023-04-08 DIAGNOSIS — H10213 Acute toxic conjunctivitis, bilateral: Secondary | ICD-10-CM | POA: Insufficient documentation

## 2023-04-08 DIAGNOSIS — Z5900 Homelessness unspecified: Secondary | ICD-10-CM | POA: Insufficient documentation

## 2023-04-08 DIAGNOSIS — H5713 Ocular pain, bilateral: Secondary | ICD-10-CM | POA: Diagnosis present

## 2023-04-08 DIAGNOSIS — T593X1A Toxic effect of lacrimogenic gas, accidental (unintentional), initial encounter: Secondary | ICD-10-CM | POA: Insufficient documentation

## 2023-04-08 MED ORDER — AMOXICILLIN-POT CLAVULANATE 875-125 MG PO TABS: 1.0000 | ORAL_TABLET | Freq: Two times a day (BID) | ORAL | 0 refills | Status: DC
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
## 2023-04-08 NOTE — Discharge Instructions (Addendum)
Take Augmentin as prescribed until finished.

## 2023-04-08 NOTE — ED Notes (Addendum)
Pt eyes irrigated in minilab.
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
## 2023-04-09 ENCOUNTER — Emergency Department (HOSPITAL_COMMUNITY)
Admission: EM | Admit: 2023-04-09 | Discharge: 2023-04-10 | Disposition: A | Discharge status: Home / Self Care | Payer: Medicare (Managed Care) | Source: Home / Self Care | Attending: Emergency Medicine | Admitting: Emergency Medicine

## 2023-04-09 ENCOUNTER — Other Ambulatory Visit: Payer: Self-pay

## 2023-04-09 ENCOUNTER — Emergency Department (HOSPITAL_COMMUNITY)
Admission: EM | Admit: 2023-04-09 | Discharge: 2023-04-09 | Disposition: A | Discharge status: Home / Self Care | Payer: Medicare (Managed Care) | Attending: Emergency Medicine | Admitting: Emergency Medicine

## 2023-04-09 ENCOUNTER — Encounter (HOSPITAL_COMMUNITY): Payer: Self-pay | Admitting: Emergency Medicine

## 2023-04-09 DIAGNOSIS — Z4802 Encounter for removal of sutures: Secondary | ICD-10-CM

## 2023-04-09 DIAGNOSIS — M79645 Pain in left finger(s): Secondary | ICD-10-CM | POA: Insufficient documentation

## 2023-04-09 MED ORDER — ACETAMINOPHEN 500 MG PO TABS
1000.0000 mg | ORAL_TABLET | Freq: Once | ORAL | Status: AC
  Administered 2023-04-09: 1000 mg via ORAL
  Filled 2023-04-09: qty 2

## 2023-04-09 MED ORDER — AMOXICILLIN-POT CLAVULANATE 875-125 MG PO TABS
1.0000 | ORAL_TABLET | Freq: Once | ORAL | Status: AC
  Administered 2023-04-09: 1 via ORAL
  Filled 2023-04-09: qty 1

## 2023-04-09 NOTE — Discharge Instructions (Addendum)
Pick up your antibiotics today-- they are at the pharmacy waiting for you. Can take tylenol or motrin as needed for pain.
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
## 2023-04-09 NOTE — ED Triage Notes (Signed)
Generalized body pain and hand pain, ring finger surgery 10 days. VSS.

## 2023-04-09 NOTE — ED Triage Notes (Signed)
Pt reports hand pain and needs tylenol and sleep. No signs of distress.
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
## 2023-04-10 NOTE — Discharge Instructions (Signed)
Keep your wound clean and dry, the sutures are removed and the wound has been healed well.  Follow-up with the surgeon as listed.  This is a Careers adviser who did your procedure.

## 2023-06-21 ENCOUNTER — Other Ambulatory Visit: Payer: Self-pay

## 2023-06-21 ENCOUNTER — Encounter (HOSPITAL_COMMUNITY): Payer: Self-pay | Admitting: Emergency Medicine

## 2023-06-21 ENCOUNTER — Emergency Department (HOSPITAL_COMMUNITY)
Admission: EM | Admit: 2023-06-21 | Discharge: 2023-06-21 | Disposition: A | Discharge status: Home / Self Care | Payer: Medicare (Managed Care) | Attending: Emergency Medicine | Admitting: Emergency Medicine

## 2023-06-21 DIAGNOSIS — R21 Rash and other nonspecific skin eruption: Secondary | ICD-10-CM | POA: Diagnosis not present

## 2023-06-21 DIAGNOSIS — R2230 Localized swelling, mass and lump, unspecified upper limb: Secondary | ICD-10-CM | POA: Diagnosis not present

## 2023-06-21 LAB — URINALYSIS, ROUTINE W REFLEX MICROSCOPIC
Bilirubin Urine: NEGATIVE
Glucose, UA: NEGATIVE mg/dL
Hgb urine dipstick: NEGATIVE
Ketones, ur: NEGATIVE mg/dL
Leukocytes,Ua: NEGATIVE
Nitrite: NEGATIVE
Protein, ur: NEGATIVE mg/dL
Specific Gravity, Urine: 1.009 (ref 1.005–1.030)
pH: 5 (ref 5.0–8.0)

## 2023-06-21 NOTE — ED Provider Notes (Signed)
[place] EMERGENCY DEPARTMENT AT [facility] Provider Note  CSN: [id number] Arrival date & time: 06/21/23  0124     History  Chief Complaint  Patient presents with   Rash    Nathan Norton is a 42 y.o. male who presents with request for a place to stay, stating he lost his housing. Also reports that he has " some bumps" on his genitals.  When asked how long they have been there he states he "needs to do some calculations".  Initially said 1 week ago subsequently stated that they have been there much longer.  No penile discharge, dysuria hematuria fevers chills.  States it has been greater than 1 year since he was sexually active.  I personally reviewed the patient's medical records.  He is well-known to this department.  History of schizoaffective disorder and traumatic brain injury, hypertension, diabetes, HSV, homelessness.  HPI     Home Medications Prior to Admission medications   Medication Sig Start Date End Date Taking? Authorizing Provider  acetaminophen (TYLENOL 8 HOUR) 650 MG CR tablet Take 1 tablet (650 mg total) by mouth every 8 (eight) hours as needed for pain. 06/19/22   Petrucelli, Samantha R, PA-C  amoxicillin-clavulanate (AUGMENTIN) 875-125 MG tablet Take 1 tablet by mouth every 12 (twelve) hours. 04/08/23   Antony Madura, PA-C  cephALEXin (KEFLEX) 500 MG capsule Take 1 capsule (500 mg total) by mouth 4 (four) times daily. 02/26/23   [name], PA-C  clotrimazole (LOTRIMIN) 1 % cream Apply to affected area 2 times daily 03/17/23   [name], PA-C  loratadine (CLARITIN) 10 MG tablet Take 1 tablet (10 mg total) by mouth daily as needed for allergies or rhinitis. 06/19/22   Petrucelli, Pleas Koch, PA-C      Allergies    Shellfish allergy    Review of Systems   Review of Systems  Genitourinary:  Positive for genital sores. Negative for decreased urine volume, hematuria, penile discharge, penile pain, penile swelling, scrotal swelling, testicular pain  and urgency.  Skin:  Positive for rash.    Physical Exam Updated Vital Signs BP 114/80   Pulse (!) 57   Temp 98 F (36.7 C)   Resp 19   Ht 6\' 2"  (1.88 m)   Wt 84.8 kg   SpO2 100%   BMI 24.00 kg/m  Physical Exam Vitals and nursing note reviewed. Exam conducted with a chaperone present (ED tech).  HENT:     Head: Normocephalic and atraumatic.  Eyes:     General: No scleral icterus.       Right eye: No discharge.        Left eye: No discharge.     Conjunctiva/sclera: Conjunctivae normal.  Cardiovascular:     Rate and Rhythm: Normal rate.  Pulmonary:     Effort: Pulmonary effort is normal.  Genitourinary:    Penis: Lesions present.      Testes: Normal.     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  No lymphadenopathy. Skin:    General: Skin is warm and dry.     Findings: Lesion present.  Neurological:     General: No focal deficit present.     Mental Status: He is alert.  Psychiatric:        Mood and Affect: Mood normal.     ED Results / Procedures / Treatments   Labs (all labs ordered are listed,  but only abnormal results are displayed) Labs Reviewed  URINALYSIS, ROUTINE W REFLEX MICROSCOPIC    EKG None  Radiology No results found.  Procedures Procedures    Medications Ordered in ED Medications - No data to display  ED Course/ Medical Decision Making/ A&P                             Medical Decision Making 42 year old male presents with concern for skin changes to his genitals as well as request for place to stay.  Vital signs are normal on intake.  Cardiopulmonary abdominal exams are benign.  GU exam as above performed in presence of ED tech as chaperone.  Amount and/or Complexity of Data Reviewed Labs: ordered.    Details: UA unremarkable.   Physical exam concerning for genital lesions consistent with genital warts.  No evidence of acute cellulitis,  no penile discharge, no concerning findings on testicular exam.  Primary presentation to ED appears to be social in nature with lack of housing at this time.  Patient provided with resources for shelters in the area.  Provided with [facility] for [facility] for outpatient follow-up for his skin lesions.  Safe sex practices reviewed with the patient.  No further reported near this time.  Clinical concern for emergent underlying etiology with patient symptoms that warrant further ED workup or inpatient management is exceedingly low.    [name] voiced understanding of her medical evaluation and treatment plan. Each of their questions answered to their expressed satisfaction.  Return precautions were given.  Patient is well-appearing, stable, and was discharged in good condition.  This chart was dictated using voice recognition software, Dragon. Despite the best efforts of this provider to proofread and correct errors, errors may still occur which can change documentation meaning.  Final Clinical Impression(s) / ED Diagnoses Final diagnoses:  Rash    Rx / DC Orders ED Discharge Orders     None         Sherrilee Gilles 06/21/23 0629    Sabas Sous, MD 06/21/23 [phone]

## 2023-06-21 NOTE — ED Triage Notes (Signed)
Pt c/o bumps on his groin x 1 week, denies any urinary symptoms or penile discharge. States that he does not believe that it could be an STD. Pt states that he feeling depressed, but denies SI. States that he wants his medications, has not taken them in a year.

## 2023-06-21 NOTE — Discharge Instructions (Signed)
Please follow up with the clinic listed below should you need further management of your skin changes.

## 2023-10-20 DIAGNOSIS — A630 Anogenital (venereal) warts: Secondary | ICD-10-CM | POA: Diagnosis not present

## 2023-10-22 DIAGNOSIS — M549 Dorsalgia, unspecified: Secondary | ICD-10-CM | POA: Diagnosis not present

## 2023-10-22 DIAGNOSIS — R0981 Nasal congestion: Secondary | ICD-10-CM | POA: Diagnosis not present

## 2023-10-22 DIAGNOSIS — R059 Cough, unspecified: Secondary | ICD-10-CM | POA: Diagnosis not present

## 2023-10-22 DIAGNOSIS — U071 COVID-19: Secondary | ICD-10-CM | POA: Diagnosis not present

## 2023-12-16 DIAGNOSIS — A630 Anogenital (venereal) warts: Secondary | ICD-10-CM | POA: Diagnosis not present

## 2024-06-21 ENCOUNTER — Emergency Department (HOSPITAL_COMMUNITY)
Admission: EM | Admit: 2024-06-21 | Discharge: 2024-06-21 | Discharge status: Against Medical Advice | Attending: Emergency Medicine | Admitting: Emergency Medicine

## 2024-06-21 DIAGNOSIS — F10129 Alcohol abuse with intoxication, unspecified: Secondary | ICD-10-CM | POA: Diagnosis present

## 2024-06-21 DIAGNOSIS — Z5321 Procedure and treatment not carried out due to patient leaving prior to being seen by health care provider: Secondary | ICD-10-CM | POA: Diagnosis not present

## 2024-06-21 DIAGNOSIS — Y908 Blood alcohol level of 240 mg/100 ml or more: Secondary | ICD-10-CM | POA: Diagnosis not present

## 2024-06-21 LAB — CBC
HCT: 40.3 % (ref 39.0–52.0)
Hemoglobin: 13.7 g/dL (ref 13.0–17.0)
MCH: 32.5 pg (ref 26.0–34.0)
MCHC: 34 g/dL (ref 30.0–36.0)
MCV: 95.5 fL (ref 80.0–100.0)
Platelets: 272 10*3/uL (ref 150–400)
RBC: 4.22 MIL/uL (ref 4.22–5.81)
RDW: 16.3 % — ABNORMAL HIGH (ref 11.5–15.5)
WBC: 4.5 10*3/uL (ref 4.0–10.5)
nRBC: 0 % (ref 0.0–0.2)

## 2024-06-21 LAB — RAPID URINE DRUG SCREEN, HOSP PERFORMED
Amphetamines: NOT DETECTED
Barbiturates: NOT DETECTED
Benzodiazepines: NOT DETECTED
Cocaine: NOT DETECTED
Opiates: NOT DETECTED
Tetrahydrocannabinol: NOT DETECTED

## 2024-06-21 LAB — COMPREHENSIVE METABOLIC PANEL WITH GFR
ALT: 27 U/L (ref 0–44)
AST: 37 U/L (ref 15–41)
Albumin: 3.7 g/dL (ref 3.5–5.0)
Alkaline Phosphatase: 77 U/L (ref 38–126)
Anion gap: 14 (ref 5–15)
BUN: 6 mg/dL (ref 6–20)
CO2: 22 mmol/L (ref 22–32)
Calcium: 8.7 mg/dL — ABNORMAL LOW (ref 8.9–10.3)
Chloride: 104 mmol/L (ref 98–111)
Creatinine, Ser: 1.44 mg/dL — ABNORMAL HIGH (ref 0.61–1.24)
GFR, Estimated: >60 mL/min (ref >60)
Glucose, Bld: 99 mg/dL (ref 70–99)
Potassium: 4.1 mmol/L (ref 3.5–5.1)
Sodium: 140 mmol/L (ref 135–145)
Total Bilirubin: 0.8 mg/dL (ref 0.0–1.2)
Total Protein: 7.3 g/dL (ref 6.5–8.1)

## 2024-06-21 LAB — ETHANOL: Alcohol, Ethyl (B): 271 mg/dL — ABNORMAL HIGH (ref <15)

## 2024-06-21 NOTE — ED Notes (Signed)
 Patient called x 3, on 3 different occasions. No answer. Pulled OTF.
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
## 2024-06-21 NOTE — ED Provider Triage Note (Signed)
 Emergency Medicine Provider Triage Evaluation Note  Nathan Norton , a 43 y.o. male  was evaluated in triage.  Pt complains of reportedly brought in after being found unresponsive patient cannot really provide much history appears    Physical Exam  BP 119/86   Pulse (!) 106   Temp 98.3 F (36.8 C) (Oral)   Resp 19   SpO2 98%  Patient does not remember the events.  Smells of alcohol.  Asks if I can come up with the nurse.  Medical Decision Making  Medically screening exam initiated at 1:58 PM.  Appropriate orders placed.  Nathan Norton was informed that the remainder of the evaluation will be completed by another provider, this initial triage assessment does not replace that evaluation, and the importance of remaining in the ED until their evaluation is complete.  Patient brought in for unresponsiveness.  Differential diagnosis does include syncope.  Also potential.  Intoxication.  Will get basic blood work EKG     Patsey Lot, MD 06/21/24 1359

## 2024-08-23 ENCOUNTER — Other Ambulatory Visit: Payer: Self-pay

## 2024-08-23 ENCOUNTER — Encounter (HOSPITAL_COMMUNITY): Payer: Self-pay | Admitting: *Deleted

## 2024-08-23 ENCOUNTER — Emergency Department (HOSPITAL_COMMUNITY)
Admission: EM | Admit: 2024-08-23 | Discharge: 2024-08-23 | Disposition: A | Discharge status: Home / Self Care | Attending: Emergency Medicine | Admitting: Emergency Medicine

## 2024-08-23 DIAGNOSIS — T148XXA Other injury of unspecified body region, initial encounter: Secondary | ICD-10-CM

## 2024-08-23 DIAGNOSIS — S50812A Abrasion of left forearm, initial encounter: Secondary | ICD-10-CM | POA: Insufficient documentation

## 2024-08-23 DIAGNOSIS — Z23 Encounter for immunization: Secondary | ICD-10-CM | POA: Diagnosis not present

## 2024-08-23 DIAGNOSIS — Y9302 Activity, running: Secondary | ICD-10-CM | POA: Diagnosis not present

## 2024-08-23 DIAGNOSIS — S59912A Unspecified injury of left forearm, initial encounter: Secondary | ICD-10-CM | POA: Diagnosis present

## 2024-08-23 DIAGNOSIS — W1839XA Other fall on same level, initial encounter: Secondary | ICD-10-CM | POA: Insufficient documentation

## 2024-08-23 DIAGNOSIS — Z5900 Homelessness unspecified: Secondary | ICD-10-CM | POA: Diagnosis not present

## 2024-08-23 MED ORDER — BACITRACIN ZINC 500 UNIT/GM EX OINT
TOPICAL_OINTMENT | Freq: Two times a day (BID) | CUTANEOUS | Status: DC
  Administered 2024-08-23: 1 via TOPICAL
  Filled 2024-08-23: qty 0.9

## 2024-08-23 MED ORDER — ACETAMINOPHEN 325 MG PO TABS
650.0000 mg | ORAL_TABLET | Freq: Once | ORAL | Status: AC
  Administered 2024-08-23: 650 mg via ORAL
  Filled 2024-08-23: qty 2

## 2024-08-23 MED ORDER — TETANUS-DIPHTH-ACELL PERTUSSIS 5-2.5-18.5 LF-MCG/0.5 IM SUSY
0.5000 mL | PREFILLED_SYRINGE | Freq: Once | INTRAMUSCULAR | Status: AC
  Administered 2024-08-23: 0.5 mL via INTRAMUSCULAR
  Filled 2024-08-23: qty 0.5

## 2024-08-23 MED ORDER — BACITRACIN ZINC 500 UNIT/GM EX OINT: TOPICAL_OINTMENT | Freq: Two times a day (BID) | CUTANEOUS | Status: DC
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
## 2024-08-23 NOTE — Discharge Instructions (Addendum)
 Your tetanus shot was updated today. Please keep your abrasions (scratches) clean and dry. Follow up with your primary care doctor.  Return to the ER with any new severe symptoms.

## 2024-09-20 ENCOUNTER — Ambulatory Visit (HOSPITAL_COMMUNITY): Admission: EM | Admit: 2024-09-20 | Discharge: 2024-09-20 | Disposition: A | Discharge status: Home / Self Care

## 2024-10-12 ENCOUNTER — Other Ambulatory Visit: Payer: Self-pay

## 2024-10-12 ENCOUNTER — Emergency Department (HOSPITAL_BASED_OUTPATIENT_CLINIC_OR_DEPARTMENT_OTHER)
Admission: EM | Admit: 2024-10-12 | Discharge: 2024-10-12 | Disposition: A | Discharge status: Home / Self Care | Attending: Emergency Medicine | Admitting: Emergency Medicine

## 2024-10-12 DIAGNOSIS — K649 Unspecified hemorrhoids: Secondary | ICD-10-CM | POA: Insufficient documentation

## 2024-10-12 DIAGNOSIS — K625 Hemorrhage of anus and rectum: Secondary | ICD-10-CM | POA: Diagnosis present

## 2024-10-12 LAB — CBC WITH DIFFERENTIAL/PLATELET
Abs Immature Granulocytes: 0.02 K/uL (ref 0.00–0.07)
Basophils Absolute: 0.1 K/uL (ref 0.0–0.1)
Basophils Relative: 1 %
Eosinophils Absolute: 0.1 K/uL (ref 0.0–0.5)
Eosinophils Relative: 2 %
HCT: 40.7 % (ref 39.0–52.0)
Hemoglobin: 14.1 g/dL (ref 13.0–17.0)
Immature Granulocytes: 0 %
Lymphocytes Relative: 11 %
Lymphs Abs: 0.7 K/uL (ref 0.7–4.0)
MCH: 33.2 pg (ref 26.0–34.0)
MCHC: 34.6 g/dL (ref 30.0–36.0)
MCV: 95.8 fL (ref 80.0–100.0)
Monocytes Absolute: 0.6 K/uL (ref 0.1–1.0)
Monocytes Relative: 9 %
Neutro Abs: 4.9 K/uL (ref 1.7–7.7)
Neutrophils Relative %: 77 %
Platelets: 277 K/uL (ref 150–400)
RBC: 4.25 MIL/uL (ref 4.22–5.81)
RDW: 15.3 % (ref 11.5–15.5)
WBC: 6.4 K/uL (ref 4.0–10.5)
nRBC: 0 % (ref 0.0–0.2)

## 2024-10-12 LAB — COMPREHENSIVE METABOLIC PANEL WITH GFR
ALT: 46 U/L — ABNORMAL HIGH (ref 0–44)
AST: 48 U/L — ABNORMAL HIGH (ref 15–41)
Albumin: 4.2 g/dL (ref 3.5–5.0)
Alkaline Phosphatase: 88 U/L (ref 38–126)
Anion gap: 10 (ref 5–15)
BUN: 10 mg/dL (ref 6–20)
CO2: 24 mmol/L (ref 22–32)
Calcium: 9.3 mg/dL (ref 8.9–10.3)
Chloride: 100 mmol/L (ref 98–111)
Creatinine, Ser: 1.21 mg/dL (ref 0.61–1.24)
GFR, Estimated: >60 mL/min (ref >60)
Glucose, Bld: 93 mg/dL (ref 70–99)
Potassium: 3.8 mmol/L (ref 3.5–5.1)
Sodium: 134 mmol/L — ABNORMAL LOW (ref 135–145)
Total Bilirubin: 0.4 mg/dL (ref 0.0–1.2)
Total Protein: 7.4 g/dL (ref 6.5–8.1)

## 2024-10-12 LAB — OCCULT BLOOD X 1 CARD TO LAB, STOOL: Fecal Occult Bld: NEGATIVE

## 2024-10-12 NOTE — ED Triage Notes (Signed)
 Pt POV reporting bloody diarrhea x1 day, similar episodes in the past x2 years, has not been seen. Denies n/v, also reporting night sweats.

## 2024-10-12 NOTE — ED Provider Notes (Signed)
  EMERGENCY DEPARTMENT AT [facility] Provider Note   CSN: [id number] Arrival date & time: 10/12/24  9957     Patient presents with: No chief complaint on file.   Nathan Norton is a 43 y.o. male.   The history is provided by the patient.  Rectal Bleeding Quality:  Bright red Amount:  Scant Timing:  Sporadic Chronicity:  New Context: defecation   Relieved by:  Nothing Worsened by:  Nothing Ineffective treatments:  None tried Associated symptoms: no abdominal pain, no fever and no loss of consciousness   Risk factors: no hx of IBD   Reports loose stool with blood on tissue following alcohol ingestion.  Would like something to eat.       Prior to Admission medications   Medication Sig Start Date End Date Taking? Authorizing Provider  acetaminophen  (TYLENOL  8 HOUR) 650 MG CR tablet Take 1 tablet (650 mg total) by mouth every 8 (eight) hours as needed for pain. 06/19/22   Petrucelli, Samantha R, PA-C  amoxicillin -clavulanate (AUGMENTIN ) 875-125 MG tablet Take 1 tablet by mouth every 12 (twelve) hours. 04/08/23   Keith Sor, PA-C  cephALEXin  (KEFLEX ) 500 MG capsule Take 1 capsule (500 mg total) by mouth 4 (four) times daily. 02/26/23   [name], PA-C  clotrimazole  (LOTRIMIN ) 1 % cream Apply to affected area 2 times daily 03/17/23   [name], PA-C  loratadine  (CLARITIN ) 10 MG tablet Take 1 tablet (10 mg total) by mouth daily as needed for allergies or rhinitis. 06/19/22   Petrucelli, Lucie SAUNDERS, PA-C    Allergies: Shellfish allergy    Review of Systems  Constitutional:  Negative for fever.  Respiratory:  Negative for shortness of breath, wheezing and stridor.   Cardiovascular:  Negative for chest pain.  Gastrointestinal:  Positive for anal bleeding and hematochezia. Negative for abdominal pain.  Neurological:  Negative for loss of consciousness.  All other systems reviewed and are negative.   Updated Vital Signs BP (!) 147/89   Pulse 79    Temp 98.4 F (36.9 C) (Temporal)   Resp 16   Ht 6' 3 (1.905 m)   SpO2 99%   BMI 26.37 kg/m   Physical Exam Vitals and nursing note reviewed. Exam conducted with a chaperone present.  Constitutional:      General: He is not in acute distress.    Appearance: He is well-developed. He is not diaphoretic.  HENT:     Head: Normocephalic and atraumatic.     Nose: Nose normal.  Eyes:     Conjunctiva/sclera: Conjunctivae normal.     Pupils: Pupils are equal, round, and reactive to light.  Cardiovascular:     Rate and Rhythm: Normal rate and regular rhythm.  Pulmonary:     Effort: Pulmonary effort is normal.     Breath sounds: Normal breath sounds. No wheezing or rales.  Abdominal:     General: Bowel sounds are normal.     Palpations: Abdomen is soft.     Tenderness: There is no abdominal tenderness. There is no guarding or rebound.  Genitourinary:    Rectum: Guaiac result negative.     Comments: Hemorrhoid  Musculoskeletal:        General: Normal range of motion.     Cervical back: Normal range of motion and neck supple.  Skin:    General: Skin is warm and dry.     Capillary Refill: Capillary refill takes less than 2 seconds.  Neurological:     General:  No focal deficit present.     Mental Status: He is alert and oriented to person, place, and time.     Deep Tendon Reflexes: Reflexes normal.  Psychiatric:        Mood and Affect: Mood normal.     (all labs ordered are listed, but only abnormal results are displayed) Results for orders placed or performed during the hospital encounter of 10/12/24  Comprehensive metabolic panel   Collection Time: 10/12/24 12:58 AM  Result Value Ref Range   Sodium 134 (L) 135 - 145 mmol/L   Potassium 3.8 3.5 - 5.1 mmol/L   Chloride 100 98 - 111 mmol/L   CO2 24 22 - 32 mmol/L   Glucose, Bld 93 70 - 99 mg/dL   BUN 10 6 - 20 mg/dL   Creatinine, Ser 8.78 0.61 - 1.24 mg/dL   Calcium 9.3 8.9 - 89.6 mg/dL   Total Protein 7.4 6.5 - 8.1 g/dL    Albumin 4.2 3.5 - 5.0 g/dL   AST 48 (H) 15 - 41 U/L   ALT 46 (H) 0 - 44 U/L   Alkaline Phosphatase 88 38 - 126 U/L   Total Bilirubin 0.4 0.0 - 1.2 mg/dL   GFR, Estimated >39 >39 mL/min   Anion gap 10 5 - 15  CBC with Differential   Collection Time: 10/12/24 12:58 AM  Result Value Ref Range   WBC 6.4 4.0 - 10.5 K/uL   RBC 4.25 4.22 - 5.81 MIL/uL   Hemoglobin 14.1 13.0 - 17.0 g/dL   HCT 59.2 60.9 - 47.9 %   MCV 95.8 80.0 - 100.0 fL   MCH 33.2 26.0 - 34.0 pg   MCHC 34.6 30.0 - 36.0 g/dL   RDW 84.6 88.4 - 84.4 %   Platelets 277 150 - 400 K/uL   nRBC 0.0 0.0 - 0.2 %   Neutrophils Relative % 77 %   Neutro Abs 4.9 1.7 - 7.7 K/uL   Lymphocytes Relative 11 %   Lymphs Abs 0.7 0.7 - 4.0 K/uL   Monocytes Relative 9 %   Monocytes Absolute 0.6 0.1 - 1.0 K/uL   Eosinophils Relative 2 %   Eosinophils Absolute 0.1 0.0 - 0.5 K/uL   Basophils Relative 1 %   Basophils Absolute 0.1 0.0 - 0.1 K/uL   Immature Granulocytes 0 %   Abs Immature Granulocytes 0.02 0.00 - 0.07 K/uL  Occult blood card to lab, stool   Collection Time: 10/12/24  2:51 AM  Result Value Ref Range   Fecal Occult Bld NEGATIVE NEGATIVE   No results found.   Radiology: No results found.   Procedures   Medications Ordered in the ED - No data to display                                  Medical Decision Making Patient with loose stool and anal bleeding   Amount and/or Complexity of Data Reviewed External Data Reviewed: notes.    Details: Previous notes reviewed  Labs: ordered.    Details: Sodium 134, normal potassium 3.8, normal creatinine.  Normal white count 6.4, normal hemoglobin 14.1, normal platelets.  Guaiac is negative for blood  Risk Risk Details: Normal hemoglobin.  Negative guaiac. Has a hemorrhois I believe that to be the source.  Regardless it has stopped.  Patient has PO challenged.  Stable for discharge.  Strict returns      Final diagnoses:  Hemorrhoids,  unspecified hemorrhoid type    No  signs of systemic illness or infection. The patient is nontoxic-appearing on exam and vital signs are within normal limits.  I have reviewed the triage vital signs and the nursing notes. Pertinent labs & imaging results that were available during my care of the patient were reviewed by me and considered in my medical decision making (see chart for details). After history, exam, and medical workup I feel the patient has been appropriately medically screened and is safe for discharge home. Pertinent diagnoses were discussed with the patient. Patient was given return precautions.  ED Discharge Orders     None          Ronnel Zuercher, MD 10/12/24 [phone]

## 2024-11-02 ENCOUNTER — Other Ambulatory Visit: Payer: Self-pay

## 2024-11-02 ENCOUNTER — Emergency Department (HOSPITAL_COMMUNITY)
Admission: EM | Admit: 2024-11-02 | Discharge: 2024-11-03 | Disposition: A | Discharge status: Home / Self Care | Source: Home / Self Care | Attending: Emergency Medicine | Admitting: Emergency Medicine

## 2024-11-02 ENCOUNTER — Other Ambulatory Visit (HOSPITAL_COMMUNITY): Payer: Self-pay

## 2024-11-02 ENCOUNTER — Encounter (HOSPITAL_COMMUNITY): Payer: Self-pay

## 2024-11-02 ENCOUNTER — Emergency Department (HOSPITAL_COMMUNITY)
Admission: EM | Admit: 2024-11-02 | Discharge: 2024-11-02 | Disposition: A | Discharge status: Home / Self Care | Attending: Emergency Medicine | Admitting: Emergency Medicine

## 2024-11-02 DIAGNOSIS — X58XXXA Exposure to other specified factors, initial encounter: Secondary | ICD-10-CM | POA: Insufficient documentation

## 2024-11-02 DIAGNOSIS — B009 Herpesviral infection, unspecified: Secondary | ICD-10-CM | POA: Insufficient documentation

## 2024-11-02 DIAGNOSIS — E119 Type 2 diabetes mellitus without complications: Secondary | ICD-10-CM | POA: Insufficient documentation

## 2024-11-02 DIAGNOSIS — M62838 Other muscle spasm: Secondary | ICD-10-CM | POA: Diagnosis present

## 2024-11-02 DIAGNOSIS — I10 Essential (primary) hypertension: Secondary | ICD-10-CM | POA: Insufficient documentation

## 2024-11-02 DIAGNOSIS — Z5900 Homelessness unspecified: Secondary | ICD-10-CM | POA: Insufficient documentation

## 2024-11-02 DIAGNOSIS — S39012A Strain of muscle, fascia and tendon of lower back, initial encounter: Secondary | ICD-10-CM | POA: Insufficient documentation

## 2024-11-02 DIAGNOSIS — F17210 Nicotine dependence, cigarettes, uncomplicated: Secondary | ICD-10-CM | POA: Insufficient documentation

## 2024-11-02 DIAGNOSIS — R252 Cramp and spasm: Secondary | ICD-10-CM | POA: Diagnosis present

## 2024-11-02 DIAGNOSIS — L299 Pruritus, unspecified: Secondary | ICD-10-CM | POA: Diagnosis not present

## 2024-11-02 LAB — CBC WITH DIFFERENTIAL/PLATELET
Abs Immature Granulocytes: 0.01 K/uL (ref 0.00–0.07)
Basophils Absolute: 0.1 K/uL (ref 0.0–0.1)
Basophils Relative: 1 %
Eosinophils Absolute: 0.1 K/uL (ref 0.0–0.5)
Eosinophils Relative: 2 %
HCT: 40.3 % (ref 39.0–52.0)
Hemoglobin: 13.4 g/dL (ref 13.0–17.0)
Immature Granulocytes: 0 %
Lymphocytes Relative: 11 %
Lymphs Abs: 0.5 K/uL — ABNORMAL LOW (ref 0.7–4.0)
MCH: 32.3 pg (ref 26.0–34.0)
MCHC: 33.3 g/dL (ref 30.0–36.0)
MCV: 97.1 fL (ref 80.0–100.0)
Monocytes Absolute: 0.4 K/uL (ref 0.1–1.0)
Monocytes Relative: 8 %
Neutro Abs: 3.8 K/uL (ref 1.7–7.7)
Neutrophils Relative %: 78 %
Platelets: 296 K/uL (ref 150–400)
RBC: 4.15 MIL/uL — ABNORMAL LOW (ref 4.22–5.81)
RDW: 15.2 % (ref 11.5–15.5)
WBC: 4.8 K/uL (ref 4.0–10.5)
nRBC: 0 % (ref 0.0–0.2)

## 2024-11-02 LAB — BASIC METABOLIC PANEL WITH GFR
Anion gap: 13 (ref 5–15)
BUN: 11 mg/dL (ref 6–20)
CO2: 23 mmol/L (ref 22–32)
Calcium: 8.8 mg/dL — ABNORMAL LOW (ref 8.9–10.3)
Chloride: 102 mmol/L (ref 98–111)
Creatinine, Ser: 1.21 mg/dL (ref 0.61–1.24)
GFR, Estimated: >60 mL/min (ref >60)
Glucose, Bld: 70 mg/dL (ref 70–99)
Potassium: 3.8 mmol/L (ref 3.5–5.1)
Sodium: 138 mmol/L (ref 135–145)

## 2024-11-02 LAB — HIV ANTIBODY (ROUTINE TESTING W REFLEX): HIV Screen 4th Generation wRfx: NONREACTIVE

## 2024-11-02 LAB — URINALYSIS, ROUTINE W REFLEX MICROSCOPIC
Bacteria, UA: NONE SEEN
Bilirubin Urine: NEGATIVE
Glucose, UA: NEGATIVE mg/dL
Hgb urine dipstick: NEGATIVE
Ketones, ur: 5 mg/dL — AB
Leukocytes,Ua: NEGATIVE
Nitrite: NEGATIVE
Protein, ur: 30 mg/dL — AB
Specific Gravity, Urine: 1.026 (ref 1.005–1.030)
pH: 5 (ref 5.0–8.0)

## 2024-11-02 LAB — MAGNESIUM: Magnesium: 2 mg/dL (ref 1.7–2.4)

## 2024-11-02 MED ORDER — VALACYCLOVIR HCL 1 G PO TABS
1000.0000 mg | ORAL_TABLET | Freq: Three times a day (TID) | ORAL | 0 refills | Status: DC
  Filled 2024-11-02: qty 21, 7d supply, fill #0

## 2024-11-02 MED ORDER — ADULT MULTIVITAMIN W/MINERALS CH
1.0000 | ORAL_TABLET | Freq: Once | ORAL | Status: AC
  Administered 2024-11-02: 1 via ORAL
  Filled 2024-11-02: qty 1

## 2024-11-02 NOTE — ED Triage Notes (Signed)
 Pt states that he has intermittent low back pain with muscle spasms x months.

## 2024-11-02 NOTE — ED Triage Notes (Addendum)
 Pt c/e leg cramps on back of both thighs and pain down to ankles for last few months. Pt also c/o rash on penis for past 2 months. Pt states he is homeless and wants a cola and a sandwich.

## 2024-11-02 NOTE — ED Notes (Signed)
 Patient Alert and oriented to baseline. Stable and ambulatory to baseline. Patient verbalized understanding of the discharge instructions.  Patient belongings were taken by the patient.
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
## 2024-11-02 NOTE — Discharge Instructions (Addendum)
 The sores on your penis are herpes outbreak.  I sent a prescription for valacyclovir to the [facility] pharmacy.  They are usually able to help with payment options.  I have also included some resources for shelters in the area.  Your blood work otherwise was normal.

## 2024-11-02 NOTE — ED Provider Triage Note (Signed)
 Emergency Medicine Provider Triage Evaluation Note  Nathan Norton , a 43 y.o. male  was evaluated in triage.  Pt complains of bilateral leg cramps.  Mostly at night.  Not exertional in nature.  Also concerned about a rash on the penis.  Denies any discharge.  No concerns about STD but he thought maybe this was genital herpes.  No skin rash.  No fevers no headache no nausea no vomiting no abdominal pain.  Review of Systems  Positive: As above Negative: As above  Physical Exam  BP 134/84 (BP Location: Right Arm)   Pulse (!) 104   Temp 98.3 F (36.8 C) (Oral)   Resp 20   SpO2 97%  Gen: Awake, no distress   Resp: Normal effort  MSK:  Moves extremities without difficulty no significant swelling.  No notable cramps.  Neuro vastly intact distally. GU: No groin adenopathy no discharge.  No ulcers.  But does have several nonvesicular bumps that are suggestive of molluscum. Other:   Medical Decision Making  Medically screening exam initiated at 11:55 AM.  Appropriate orders placed.  Nathan Norton was informed that the remainder of the evaluation will be completed by another provider, this initial triage assessment does not replace that evaluation, and the importance of remaining in the ED until their evaluation is complete.  Will check electrolytes and basic labs for the muscle cramps.  The GU seems to be consistent with molluscum.  No discharge.  But will check RPR and HIV.   Brittiney Dicostanzo, MD 11/02/24 1158

## 2024-11-03 ENCOUNTER — Encounter (HOSPITAL_COMMUNITY): Payer: Self-pay | Admitting: *Deleted

## 2024-11-03 ENCOUNTER — Emergency Department (HOSPITAL_COMMUNITY)
Admission: EM | Admit: 2024-11-03 | Discharge: 2024-11-04 | Discharge status: Against Medical Advice | Source: Home / Self Care

## 2024-11-03 ENCOUNTER — Other Ambulatory Visit: Payer: Self-pay

## 2024-11-03 ENCOUNTER — Encounter (HOSPITAL_COMMUNITY): Payer: Self-pay

## 2024-11-03 ENCOUNTER — Emergency Department (HOSPITAL_COMMUNITY)
Admission: EM | Admit: 2024-11-03 | Discharge: 2024-11-03 | Disposition: A | Discharge status: Home / Self Care | Attending: Emergency Medicine | Admitting: Emergency Medicine

## 2024-11-03 DIAGNOSIS — L299 Pruritus, unspecified: Secondary | ICD-10-CM | POA: Diagnosis present

## 2024-11-03 DIAGNOSIS — K0889 Other specified disorders of teeth and supporting structures: Secondary | ICD-10-CM | POA: Diagnosis present

## 2024-11-03 DIAGNOSIS — Z5321 Procedure and treatment not carried out due to patient leaving prior to being seen by health care provider: Secondary | ICD-10-CM | POA: Insufficient documentation

## 2024-11-03 LAB — RPR: RPR Ser Ql: NONREACTIVE

## 2024-11-03 MED ORDER — KETOROLAC TROMETHAMINE 60 MG/2ML IM SOLN
30.0000 mg | Freq: Once | INTRAMUSCULAR | Status: AC
  Administered 2024-11-03: 30 mg via INTRAMUSCULAR
  Filled 2024-11-03: qty 2

## 2024-11-03 MED ORDER — DIPHENHYDRAMINE HCL 25 MG PO CAPS
25.0000 mg | ORAL_CAPSULE | Freq: Once | ORAL | Status: AC
  Administered 2024-11-03: 25 mg via ORAL
  Filled 2024-11-03: qty 1

## 2024-11-03 NOTE — ED Triage Notes (Signed)
 The pt has body aches and a toothache tonight while he was walking here
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
## 2024-11-03 NOTE — ED Triage Notes (Signed)
 Pt reports with itching for 3 days. Pt states that he is itching all over. No rash or skin issues visualized.

## 2024-11-03 NOTE — Discharge Instructions (Addendum)
 I recommend using a daily moisturizer--you can use over-the-counter Benadryl  as needed for ongoing itching

## 2024-11-04 ENCOUNTER — Other Ambulatory Visit (HOSPITAL_COMMUNITY): Payer: Self-pay

## 2024-11-04 DIAGNOSIS — E119 Type 2 diabetes mellitus without complications: Secondary | ICD-10-CM | POA: Diagnosis not present

## 2024-11-04 DIAGNOSIS — Z5900 Homelessness unspecified: Secondary | ICD-10-CM | POA: Insufficient documentation

## 2024-11-04 DIAGNOSIS — F17210 Nicotine dependence, cigarettes, uncomplicated: Secondary | ICD-10-CM | POA: Diagnosis not present

## 2024-11-04 DIAGNOSIS — I10 Essential (primary) hypertension: Secondary | ICD-10-CM | POA: Insufficient documentation

## 2024-11-04 DIAGNOSIS — M791 Myalgia, unspecified site: Secondary | ICD-10-CM | POA: Diagnosis present

## 2024-11-04 NOTE — ED Notes (Signed)
Pt unable to locate in lobby.

## 2024-11-05 ENCOUNTER — Encounter (HOSPITAL_COMMUNITY): Payer: Self-pay | Admitting: *Deleted

## 2024-11-05 ENCOUNTER — Emergency Department (HOSPITAL_COMMUNITY)
Admission: EM | Admit: 2024-11-05 | Discharge: 2024-11-05 | Disposition: A | Discharge status: Home / Self Care | Attending: Emergency Medicine | Admitting: Emergency Medicine

## 2024-11-05 ENCOUNTER — Other Ambulatory Visit: Payer: Self-pay

## 2024-11-05 ENCOUNTER — Other Ambulatory Visit (HOSPITAL_COMMUNITY): Payer: Self-pay

## 2024-11-05 ENCOUNTER — Emergency Department (HOSPITAL_COMMUNITY)
Admission: EM | Admit: 2024-11-05 | Discharge: 2024-11-06 | Disposition: A | Discharge status: Home / Self Care | Attending: Emergency Medicine | Admitting: Emergency Medicine

## 2024-11-05 DIAGNOSIS — Z5900 Homelessness unspecified: Secondary | ICD-10-CM | POA: Insufficient documentation

## 2024-11-05 DIAGNOSIS — M791 Myalgia, unspecified site: Secondary | ICD-10-CM | POA: Diagnosis not present

## 2024-11-05 DIAGNOSIS — R52 Pain, unspecified: Secondary | ICD-10-CM

## 2024-11-05 MED ORDER — HYDROCODONE-ACETAMINOPHEN 5-325 MG PO TABS
1.0000 | ORAL_TABLET | Freq: Once | ORAL | Status: AC
  Administered 2024-11-05: 1 via ORAL
  Filled 2024-11-05: qty 1

## 2024-11-05 MED ORDER — DICLOFENAC SODIUM 25 MG PO TBEC
25.0000 mg | DELAYED_RELEASE_TABLET | Freq: Two times a day (BID) | ORAL | 0 refills | Status: DC | PRN
  Filled 2024-11-05: qty 14, 7d supply, fill #0

## 2024-11-05 MED ORDER — ACETAMINOPHEN 325 MG PO TABS
650.0000 mg | ORAL_TABLET | Freq: Four times a day (QID) | ORAL | 0 refills | Status: AC | PRN
  Filled 2024-11-05: qty 36, 5d supply, fill #0

## 2024-11-05 NOTE — ED Triage Notes (Signed)
 Patient states he needs some benadryl  and antifunagal cream

## 2024-11-05 NOTE — ED Triage Notes (Signed)
 Pt c/o feet pain, back pain and all over pain. He says when he is out in the cold it is worse. Ambulatory.
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
## 2024-11-05 NOTE — Discharge Instructions (Addendum)
 It was a pleasure caring for you today in the emergency department.  You are prescribed a medicine called Voltaren, this is an NSAID.  Please not take this with other NSAIDs such as ibuprofen , Goody's powder, aspirin, Aleve , etc.  Please return to the emergency department for any worsening or worrisome symptoms.      RESOURCE GUIDE  Chronic Pain Problems: Contact [facility]  [phone] Patients need to be referred by their primary care doctor.  Insufficient Money for Medicine: Contact [employer]:  call [phone]  No Primary Care Doctor: Call [facility]  [phone] - can help you locate a primary care doctor that  accepts your insurance, provides certain services, etc. Physician Referral Service- [phone]  Agencies that provide inexpensive medical care: [facility]  [phone] [facility]  [phone] [facility]  [phone] [employer]  [phone] [facility]  [phone] [facility]  [phone]  Medicaid-accepting [facility] Providers: [facility]- [address]  [phone], Mon-Fri 9am-7pm, Sat 9am-1pm [facility]- [address] [place], [address] [place]  [phone] [facility]- [address], [address] [place]  [phone] [facility]- [address]  [phone] [facility]- [address] [place], [address], [phone]  Only accepts [place] patients after they have their name  applied to their card  Self Pay (no insurance) in [facility]: Sickle Cell Patients - [facility]  [address] [place], [phone] [facility]- [address] [place]  [phone]       [place] [facility]- [address]       -     [facility]- see information above (Speak to [employer] if you do not have insurance)       -  [facility]- [address],  [phone]       -  [facility]- [address], [phone]       -  [facility]-  [address], [place], [phone]       -  [facility] - [address] [phone]       -  [facility]- [address], [phone], also [address], [phone]       -     [facility]- [address] [place], [phone], 1st & 3rd Saturday         every month, 10am-1pm  -     [facility]   [address] [place], [place].   Phone:  [phone], Fax:  [phone]. Hours of Operation:  9 am - 6 pm, M-F.  -     [facility]   [address], [place]   Phone: [phone], Fax: [phone]. Hours of Operation:  8:30 am - 5:30 pm, M-F.    Dental Assistance If unable to pay or uninsured, contact:  [facility]. to become qualified for the adult dental clinic.  Patients with Medicaid: [facility] [phone] [address], [phone] [address] [phone]  If unable to pay, or uninsured, contact [facility] [phone] in [place], [phone] in [facility]) to become qualified for the adult dental clinic  [facility] [address] [place], [place] [address] [phone] [email or web address]  Other Proofreader Services: [facility]- [address] [place], [place], [place], [address], [phone], Ext. [phone], 2nd and 4th Thursday of the month at 6:30am.  10 clients each day by appointment, can sometimes see walk-in patients if someone does not show for an appointment. [facility]- [address] [place], [place], [address], [phone] [facility] [address], [place], [place], [address], [phone] [facility] Health Department- [phone] [facility] Health Department- [phone] [facility] [phone]

## 2024-11-05 NOTE — ED Notes (Signed)
 Pt ambulatory to waiting room. Pt verbalized understanding of discharge instructions.

## 2024-11-06 ENCOUNTER — Emergency Department (HOSPITAL_COMMUNITY)
Admission: EM | Admit: 2024-11-06 | Discharge: 2024-11-06 | Disposition: A | Discharge status: Home / Self Care | Attending: Emergency Medicine | Admitting: Emergency Medicine

## 2024-11-06 ENCOUNTER — Encounter (HOSPITAL_COMMUNITY): Payer: Self-pay | Admitting: *Deleted

## 2024-11-06 ENCOUNTER — Other Ambulatory Visit: Payer: Self-pay

## 2024-11-06 DIAGNOSIS — R197 Diarrhea, unspecified: Secondary | ICD-10-CM | POA: Diagnosis not present

## 2024-11-06 DIAGNOSIS — M791 Myalgia, unspecified site: Secondary | ICD-10-CM | POA: Diagnosis not present

## 2024-11-06 DIAGNOSIS — R059 Cough, unspecified: Secondary | ICD-10-CM | POA: Diagnosis present

## 2024-11-06 DIAGNOSIS — R051 Acute cough: Secondary | ICD-10-CM | POA: Insufficient documentation

## 2024-11-06 DIAGNOSIS — E119 Type 2 diabetes mellitus without complications: Secondary | ICD-10-CM | POA: Insufficient documentation

## 2024-11-06 LAB — RESP PANEL BY RT-PCR (RSV, FLU A&B, COVID)  RVPGX2
Influenza A by PCR: NEGATIVE
Influenza B by PCR: NEGATIVE
Resp Syncytial Virus by PCR: NEGATIVE
SARS Coronavirus 2 by RT PCR: NEGATIVE

## 2024-11-06 MED ORDER — CLOTRIMAZOLE 1 % EX CREA: TOPICAL_CREAM | CUTANEOUS | 0 refills | Status: DC

## 2024-11-06 MED ORDER — DIPHENHYDRAMINE HCL 25 MG PO TABS: 25.0000 mg | ORAL_TABLET | Freq: Four times a day (QID) | ORAL | 0 refills | Status: AC | PRN

## 2024-11-06 MED ORDER — LOPERAMIDE HCL 2 MG PO CAPS
4.0000 mg | ORAL_CAPSULE | Freq: Once | ORAL | Status: AC
  Administered 2024-11-06: 4 mg via ORAL
  Filled 2024-11-06: qty 2

## 2024-11-06 NOTE — Discharge Instructions (Signed)
 Return for any problem.  ?
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
## 2024-11-06 NOTE — Discharge Instructions (Signed)
 As we discussed you likely have a viral infection  We have swabbed you for COVID and flu and RSV and you can see the results tomorrow  Follow-up with your doctor  Return to ER if you have severe diarrhea or abdominal pain or vomiting or dehydration

## 2024-11-06 NOTE — ED Provider Notes (Signed)
 [place] EMERGENCY DEPARTMENT AT [facility] Provider Note   CSN: [id number] Arrival date & time: 11/06/24  1954     Patient presents with: No chief complaint on file.   Nathan Norton is a 43 y.o. male hx of homelessness, alcohol abuse, diabetes, schizoaffective disorder, patient here presenting with cough and diarrhea.  Patient is homeless and was seen here this morning.  Patient then went to the gym and worked out and had some cough.  Patient also has some bodyaches.  Patient also had an episode of diarrhea.  Patient states that he was able to tolerate Gatorade and was able to eat some chips.  Patient has no vomiting or fever   The history is provided by the patient.       Prior to Admission medications   Medication Sig Start Date End Date Taking? Authorizing Provider  acetaminophen  (TYLENOL ) 325 MG tablet Take 2 tablets (650 mg total) by mouth every 6 (six) hours as needed. 11/05/24   Elnor Jayson LABOR, DO  amoxicillin -clavulanate (AUGMENTIN ) 875-125 MG tablet Take 1 tablet by mouth every 12 (twelve) hours. 04/08/23   Keith Sor, PA-C  cephALEXin  (KEFLEX ) 500 MG capsule Take 1 capsule (500 mg total) by mouth 4 (four) times daily. 02/26/23   [name], PA-C  clotrimazole  (LOTRIMIN ) 1 % cream Apply to affected area 2 times daily 03/17/23   [name], PA-C  clotrimazole  (LOTRIMIN ) 1 % cream Apply to affected area 2 times daily 11/06/24   Laurice Maude BROCKS, MD  diclofenac  (VOLTAREN) 25 MG EC tablet Take 1 tablet (25 mg total) by mouth 2 (two) times daily as needed. 11/05/24   Elnor Jayson LABOR, DO  diphenhydrAMINE  (BENADRYL ) 25 MG tablet Take 1 tablet (25 mg total) by mouth every 6 (six) hours as needed. 11/06/24   Laurice Maude BROCKS, MD  loratadine  (CLARITIN ) 10 MG tablet Take 1 tablet (10 mg total) by mouth daily as needed for allergies or rhinitis. 06/19/22   Petrucelli, Samantha R, PA-C  valACYclovir (VALTREX) 1000 MG tablet Take 1 tablet (1,000 mg total) by mouth 3 (three)  times daily. 11/02/24   [name], DO    Allergies: Shellfish allergy    Review of Systems  Respiratory:  Positive for cough.   All other systems reviewed and are negative.   Updated Vital Signs BP (!) 137/93 (BP Location: Left Arm)   Pulse 99   Temp 98.7 F (37.1 C) (Oral)   Resp 15   Ht 6' 3 (1.905 m)   Wt 95.7 kg   SpO2 99%   BMI 26.37 kg/m   Physical Exam Vitals and nursing note reviewed.  Constitutional:      Appearance: Normal appearance.     Comments: Disheveled  HENT:     Head: Normocephalic.     Nose: Nose normal.     Mouth/Throat:     Mouth: Mucous membranes are moist.  Eyes:     Extraocular Movements: Extraocular movements intact.     Pupils: Pupils are equal, round, and reactive to light.  Cardiovascular:     Rate and Rhythm: Normal rate and regular rhythm.     Pulses: Normal pulses.     Heart sounds: Normal heart sounds.  Pulmonary:     Effort: Pulmonary effort is normal.     Breath sounds: Normal breath sounds.  Abdominal:     General: Abdomen is flat.     Palpations: Abdomen is soft.  Musculoskeletal:  General: Normal range of motion.     Cervical back: Normal range of motion and neck supple.  Skin:    General: Skin is warm.     Capillary Refill: Capillary refill takes less than 2 seconds.  Neurological:     General: No focal deficit present.     Mental Status: He is alert and oriented to person, place, and time.  Psychiatric:        Mood and Affect: Mood normal.        Behavior: Behavior normal.     (all labs ordered are listed, but only abnormal results are displayed) Labs Reviewed  RESP PANEL BY RT-PCR (RSV, FLU A&B, COVID)  [id number]    EKG: None  Radiology: No results found.   Procedures   Medications Ordered in the ED  loperamide (IMODIUM) capsule 4 mg (has no administration in time range)                                    Medical Decision Making Nathan Norton is a 43 y.o. male here with diarrhea and  cough. Patient's abdomen is nontender.  Patient likely has viral gastroenteritis.  Patient is able to tolerate p.o.  Swab for COVID flu and RSV.  Given Imodium in the ER.  Vitals are stable (initial O2 88% was erroneous documentation and repeat is 99% on RA). stable for discharge   Problems Addressed: Acute cough: acute illness or injury Diarrhea, unspecified type: acute illness or injury  Risk Prescription drug management.     Final diagnoses:  None    ED Discharge Orders     None          Patt Alm Macho, MD 11/06/24 2045

## 2024-11-06 NOTE — ED Triage Notes (Signed)
 Pt c/o diarrhea and coughing that started today after working out.

## 2024-11-06 NOTE — ED Provider Notes (Signed)
 [place] EMERGENCY DEPARTMENT AT [facility] Provider Note   CSN: [id number] Arrival date & time: 11/05/24  8176     Patient presents with: No chief complaint on file.   Nathan Norton is a 43 y.o. male.   43 year old male with prior medical history as detailed below presents for evaluation.  Patient is here frequently.  Patient without emergent complaint.  He is currently homeless.  I suspect that he was in the waiting room primarily to avoid inclement weather.  He request prescriptions for both antifungal foot cream and Benadryl .  He is aware that these medications are OTC.  He does not have other complaint.  The history is provided by the patient and medical records.       Prior to Admission medications   Medication Sig Start Date End Date Taking? Authorizing Provider  clotrimazole  (LOTRIMIN ) 1 % cream Apply to affected area 2 times daily 11/06/24  Yes Laydon Martis, Maude BROCKS, MD  diphenhydrAMINE  (BENADRYL ) 25 MG tablet Take 1 tablet (25 mg total) by mouth every 6 (six) hours as needed. 11/06/24  Yes Laurice Maude BROCKS, MD  acetaminophen  (TYLENOL ) 325 MG tablet Take 2 tablets (650 mg total) by mouth every 6 (six) hours as needed. 11/05/24   Elnor Jayson LABOR, DO  amoxicillin -clavulanate (AUGMENTIN ) 875-125 MG tablet Take 1 tablet by mouth every 12 (twelve) hours. 04/08/23   Keith Sor, PA-C  cephALEXin  (KEFLEX ) 500 MG capsule Take 1 capsule (500 mg total) by mouth 4 (four) times daily. 02/26/23   [name], PA-C  clotrimazole  (LOTRIMIN ) 1 % cream Apply to affected area 2 times daily 03/17/23   [name], PA-C  diclofenac  (VOLTAREN) 25 MG EC tablet Take 1 tablet (25 mg total) by mouth 2 (two) times daily as needed. 11/05/24   Elnor Jayson LABOR, DO  loratadine  (CLARITIN ) 10 MG tablet Take 1 tablet (10 mg total) by mouth daily as needed for allergies or rhinitis. 06/19/22   Petrucelli, Samantha R, PA-C  valACYclovir (VALTREX) 1000 MG tablet Take 1 tablet (1,000 mg total) by mouth  3 (three) times daily. 11/02/24   [name], DO    Allergies: Shellfish allergy    Review of Systems  All other systems reviewed and are negative.   Updated Vital Signs BP (!) 146/96 (BP Location: Right Arm)   Pulse 82   Temp 98 F (36.7 C)   Resp 18   Ht 6' 3 (1.905 m)   Wt 95.7 kg   SpO2 99%   BMI 26.37 kg/m   Physical Exam Vitals and nursing note reviewed.  Constitutional:      General: He is not in acute distress.    Appearance: Normal appearance. He is well-developed.  HENT:     Head: Normocephalic and atraumatic.  Eyes:     Conjunctiva/sclera: Conjunctivae normal.     Pupils: Pupils are equal, round, and reactive to light.  Cardiovascular:     Rate and Rhythm: Normal rate and regular rhythm.     Heart sounds: Normal heart sounds.  Pulmonary:     Effort: Pulmonary effort is normal. No respiratory distress.     Breath sounds: Normal breath sounds.  Abdominal:     General: There is no distension.     Palpations: Abdomen is soft.     Tenderness: There is no abdominal tenderness.  Musculoskeletal:        General: No deformity. Normal range of motion.     Cervical back: Normal range of motion  and neck supple.  Skin:    General: Skin is warm and dry.  Neurological:     General: No focal deficit present.     Mental Status: He is alert and oriented to person, place, and time.     (all labs ordered are listed, but only abnormal results are displayed) Labs Reviewed - No data to display  EKG: None  Radiology: No results found.   Procedures   Medications Ordered in the ED - No data to display                                  Medical Decision Making Patient is homeless.  He does not appear to have acute complaint.  I think that he was in the waiting room overnight to avoid the rain.  He tells me that he needs a prescription for antifungal foot cream and Benadryl .  He is aware that these medications are OTC.  Prescriptions provided.  Patient  is appropriate for discharge.  Risk OTC drugs.        Final diagnoses:  Homeless    ED Discharge Orders          Ordered    clotrimazole  (LOTRIMIN ) 1 % cream        11/06/24 0739    diphenhydrAMINE  (BENADRYL ) 25 MG tablet  Every 6 hours PRN        11/06/24 0739               Laurice Maude BROCKS, MD 11/06/24 [phone]

## 2024-11-07 ENCOUNTER — Encounter (HOSPITAL_BASED_OUTPATIENT_CLINIC_OR_DEPARTMENT_OTHER): Payer: Self-pay | Admitting: Emergency Medicine

## 2024-11-07 ENCOUNTER — Emergency Department (HOSPITAL_BASED_OUTPATIENT_CLINIC_OR_DEPARTMENT_OTHER)
Admission: EM | Admit: 2024-11-07 | Discharge: 2024-11-07 | Disposition: A | Discharge status: Home / Self Care | Attending: Emergency Medicine | Admitting: Emergency Medicine

## 2024-11-07 ENCOUNTER — Emergency Department (HOSPITAL_BASED_OUTPATIENT_CLINIC_OR_DEPARTMENT_OTHER): Admitting: Radiology

## 2024-11-07 DIAGNOSIS — W19XXXA Unspecified fall, initial encounter: Secondary | ICD-10-CM | POA: Diagnosis not present

## 2024-11-07 DIAGNOSIS — S300XXA Contusion of lower back and pelvis, initial encounter: Secondary | ICD-10-CM

## 2024-11-07 DIAGNOSIS — S3992XA Unspecified injury of lower back, initial encounter: Secondary | ICD-10-CM | POA: Insufficient documentation

## 2024-11-07 NOTE — ED Triage Notes (Signed)
  Patient BIB EMS after a fall at gas station.  Patient states he had mechanical fall stepping off the curb and landed on his back.  Denies hitting his head.  No LOC.  Endorses lower back pain and tingling down bilateral legs.  Pain 8/10, aching.

## 2024-11-07 NOTE — Discharge Instructions (Signed)
 Your x-ray showed no evidence for fracture or misalignment.  Take ibuprofen  600 mg every 6 hours as needed for pain.

## 2024-11-07 NOTE — ED Provider Notes (Signed)
 [place] EMERGENCY DEPARTMENT AT [facility] Provider Note   CSN: [id number] Arrival date & time: 11/07/24  0102     Patient presents with: Fall and Back Pain   Nathan Norton is a 43 y.o. male.   Patient is a 43 year old male with history of homelessness well-known to the emergency department for frequent visits.  Patient states that he fell on the sidewalk and injured his low back.  He is complaining of back discomfort with no radiation into his legs.  No bowel or bladder complaints.       Prior to Admission medications   Medication Sig Start Date End Date Taking? Authorizing Provider  acetaminophen  (TYLENOL ) 325 MG tablet Take 2 tablets (650 mg total) by mouth every 6 (six) hours as needed. 11/05/24   Elnor Jayson LABOR, DO  amoxicillin -clavulanate (AUGMENTIN ) 875-125 MG tablet Take 1 tablet by mouth every 12 (twelve) hours. 04/08/23   Keith Sor, PA-C  cephALEXin  (KEFLEX ) 500 MG capsule Take 1 capsule (500 mg total) by mouth 4 (four) times daily. 02/26/23   [name], PA-C  clotrimazole  (LOTRIMIN ) 1 % cream Apply to affected area 2 times daily 03/17/23   [name], PA-C  clotrimazole  (LOTRIMIN ) 1 % cream Apply to affected area 2 times daily 11/06/24   Laurice Maude BROCKS, MD  diclofenac  (VOLTAREN) 25 MG EC tablet Take 1 tablet (25 mg total) by mouth 2 (two) times daily as needed. 11/05/24   Elnor Jayson LABOR, DO  diphenhydrAMINE  (BENADRYL ) 25 MG tablet Take 1 tablet (25 mg total) by mouth every 6 (six) hours as needed. 11/06/24   Laurice Maude BROCKS, MD  loratadine  (CLARITIN ) 10 MG tablet Take 1 tablet (10 mg total) by mouth daily as needed for allergies or rhinitis. 06/19/22   Petrucelli, Samantha R, PA-C  valACYclovir (VALTREX) 1000 MG tablet Take 1 tablet (1,000 mg total) by mouth 3 (three) times daily. 11/02/24   [name], DO    Allergies: Shellfish allergy    Review of Systems  All other systems reviewed and are negative.   Updated Vital Signs BP (!) 134/90  (BP Location: Right Arm)   Pulse 92   Temp 97.7 F (36.5 C) (Oral)   Resp 18   Ht 6' 3 (1.905 m)   Wt 95.3 kg   SpO2 94%   BMI 26.25 kg/m   Physical Exam Vitals and nursing note reviewed.  Constitutional:      Appearance: Normal appearance.  Pulmonary:     Effort: Pulmonary effort is normal.  Musculoskeletal:     Comments: There is tenderness to palpation in the soft tissues of the lower lumbar region.  No bony tenderness or step-off.  Skin:    General: Skin is warm and dry.  Neurological:     Mental Status: He is alert and oriented to person, place, and time.     (all labs ordered are listed, but only abnormal results are displayed) Labs Reviewed - No data to display  EKG: None  Radiology: No results found.   Procedures   Medications Ordered in the ED - No data to display                                  Medical Decision Making Amount and/or Complexity of Data Reviewed Radiology: ordered.   X-rays are negative.  Patient to be discharged with ibuprofen , rest, and follow-up as needed.  Final diagnoses:  None    ED Discharge Orders     None          Geroldine Berg, MD 11/07/24 [phone]

## 2024-11-09 ENCOUNTER — Encounter (HOSPITAL_COMMUNITY): Payer: Self-pay

## 2024-11-09 ENCOUNTER — Emergency Department (HOSPITAL_COMMUNITY)
Admission: EM | Admit: 2024-11-09 | Discharge: 2024-11-09 | Disposition: A | Discharge status: Home / Self Care | Attending: Emergency Medicine | Admitting: Emergency Medicine

## 2024-11-09 ENCOUNTER — Other Ambulatory Visit: Payer: Self-pay

## 2024-11-09 DIAGNOSIS — X31XXXA Exposure to excessive natural cold, initial encounter: Secondary | ICD-10-CM | POA: Diagnosis not present

## 2024-11-09 DIAGNOSIS — T699XXA Effect of reduced temperature, unspecified, initial encounter: Secondary | ICD-10-CM | POA: Insufficient documentation

## 2024-11-09 DIAGNOSIS — Z5900 Homelessness unspecified: Secondary | ICD-10-CM | POA: Insufficient documentation

## 2024-11-09 NOTE — ED Provider Notes (Signed)
 [place] EMERGENCY DEPARTMENT AT [facility] Provider Note   CSN: [id number] Arrival date & time: 11/09/24  0153     Patient presents with: Cold Exposure   Nathan Norton is a 43 y.o. male patient with history of homelessness well-known to the emergency department for frequent visits who presents to the emergency department today for further evaluation of cold exposure.  Is currently 26 degrees outside and patient is having problems being cold.  States his fingers were getting numb which prompted his arrival here.  He denies any other complaints.   HPI     Prior to Admission medications   Medication Sig Start Date End Date Taking? Authorizing Provider  acetaminophen  (TYLENOL ) 325 MG tablet Take 2 tablets (650 mg total) by mouth every 6 (six) hours as needed. 11/05/24   N[place] Jayson LABOR, DO  amoxicillin -clavulanate (AUGMENTIN ) 875-125 MG tablet Take 1 tablet by mouth every 12 (twelve) hours. 04/08/23   [place] Sor, PA-C  cephALEXin  (KEFLEX ) 500 MG capsule Take 1 capsule (500 mg total) by mouth 4 (four) times daily. 02/26/23   [place] Olam HERO, PA-C  clotrimazole  (LOTRIMIN ) 1 % cream Apply to affected area 2 times daily 03/17/23   Nathan Olam HERO, PA-C  clotrimazole  (LOTRIMIN ) 1 % cream Apply to affected area 2 times daily 11/06/24   [place]athan Maude BROCKS, MD  diclofenac  (VOLTAREN) 25 MG EC tablet Take 1 tablet (25 mg total) by mouth 2 (two) times daily as needed. 11/05/24   Nathan Jayson LABOR, DO  diphenhydrAMINE  (BENADRYL ) 25 MG tablet Take 1 tablet (25 mg total) by mouth every 6 (six) hours as needed. 11/06/24   Nathan Maude BROCKS, MD  loratadine  (CLARITIN ) 10 MG tablet Take 1 tablet (10 mg total) by mouth daily as needed for allergies or rhinitis. 06/19/22   Petrucelli, Samantha R, PA-C  valACYclovir (VALTREX) 1000 MG tablet Take 1 tablet (1,000 mg total) by mouth 3 (three) times daily. 11/02/24   Nathan Fairy DASEN, DO    Allergies: Shellfish allergy    Review of Systems  All other systems  reviewed and are negative.   Updated Vital Signs BP (!) 167/107   Pulse (!) 114   Temp 97.8 F (36.6 C)   Resp 16   SpO2 96%   Physical Exam Vitals and nursing note reviewed.  Constitutional:      General: He is not in acute distress.    Appearance: Normal appearance.  HENT:     Head: Normocephalic and atraumatic.  Eyes:     General:        Right eye: No discharge.        Left eye: No discharge.  Cardiovascular:     Comments: Regular rate and rhythm.  S1/S2 are distinct without any evidence of murmur, rubs, or gallops.  Radial pulses are 2+ bilaterally.  Dorsalis pedis pulses are 2+ bilaterally.  No evidence of pedal edema. Pulmonary:     Comments: Clear to auscultation bilaterally.  Normal effort.  No respiratory distress.  No evidence of wheezes, rales, or rhonchi heard throughout. Abdominal:     General: Abdomen is flat. Bowel sounds are normal. There is no distension.     Tenderness: There is no abdominal tenderness. There is no guarding or rebound.  Musculoskeletal:        General: Normal range of motion.     Cervical back: Neck supple.  Skin:    General: Skin is warm and dry.     Findings: No rash.  Neurological:  General: No focal deficit present.     Mental Status: He is alert.  Psychiatric:        Mood and Affect: Mood normal.        Behavior: Behavior normal.     (all labs ordered are listed, but only abnormal results are displayed) Labs Reviewed - No data to display  EKG: None  Radiology: No results found.   Procedures   Medications Ordered in the ED - No data to display   Medical Decision Making Nathan Norton is a 43 y.o. male patient who presents to the emergency department today for further evaluation of cold exposure.  No evidence of frostbite.  Will plan to let the patient hang out in the emergency department for a few hours and we will plan to discharge home.  Patient has been here for 4 hours.  Has been acting normally.  Will plan to  discharge home.  Strict return precautions were discussed.  He is safe for discharge.     Final diagnoses:  Cold exposure, initial encounter    ED Discharge Orders     None          Nathan Norton, [place] 11/09/24 0554    Nathan Ozell HERO, MD 11/09/24 [phone]

## 2024-11-09 NOTE — ED Triage Notes (Signed)
 Pt is coming after just being at another hospital from what he said, he says he just wants to sleep and he is cold. Pt is otherwise stable with no other complaints and is very talkative in triage.

## 2024-11-09 NOTE — Discharge Instructions (Addendum)
 Please return to the emergency department for any worsening symptoms.

## 2024-11-10 ENCOUNTER — Other Ambulatory Visit: Payer: Self-pay

## 2024-11-10 ENCOUNTER — Emergency Department (HOSPITAL_COMMUNITY)
Admission: EM | Admit: 2024-11-10 | Discharge: 2024-11-11 | Disposition: A | Discharge status: Home / Self Care | Attending: Emergency Medicine | Admitting: Emergency Medicine

## 2024-11-10 DIAGNOSIS — K029 Dental caries, unspecified: Secondary | ICD-10-CM | POA: Diagnosis not present

## 2024-11-10 DIAGNOSIS — Z5900 Homelessness unspecified: Secondary | ICD-10-CM | POA: Diagnosis not present

## 2024-11-10 DIAGNOSIS — K0889 Other specified disorders of teeth and supporting structures: Secondary | ICD-10-CM | POA: Diagnosis present

## 2024-11-10 NOTE — ED Triage Notes (Signed)
 PT HERE WITH UPPER LEFT DENTAL PAIN/GUM PAIN. PT HAS MISSING TEETH AND HAS HAD PAIN FOR THE LAST WEEK TO AREA. PT ALSO HAS BURNING SENSATION TO LEFT FOOT. HAS BEEN GOING ON FOR 2 YEARS

## 2024-11-11 MED ORDER — VALACYCLOVIR HCL 1 G PO TABS: 1000.0000 mg | ORAL_TABLET | Freq: Three times a day (TID) | ORAL | 0 refills | Status: DC

## 2024-11-11 MED ORDER — AMOXICILLIN-POT CLAVULANATE 875-125 MG PO TABS: 1.0000 | ORAL_TABLET | Freq: Two times a day (BID) | ORAL | 0 refills | Status: DC

## 2024-11-11 NOTE — Discharge Instructions (Addendum)
 You were seen in the ER today for evaluation of your dental pain. I am starting you on an antibiotic. Please take as prescribed. Ultimately, you will need to see a dentist. I have included the information for one into this paperwork in addition to some shelter resources. Please review. If you have any concerns, new or worsening symptoms, please return to the nearest ER.   Contact a dental care provider if: You have dental pain and you don't know why. Your pain isn't controlled with medicines. Your symptoms get worse. You have new symptoms. Get help right away if: You can't open your mouth. You're having trouble breathing or swallowing. You have a fever. Your face, neck, or jaw is swollen. These symptoms may be an emergency. Call 911 right away. Do not wait to see if the symptoms will go away. Do not drive yourself to the hospital.

## 2024-11-11 NOTE — ED Provider Notes (Signed)
 [place] EMERGENCY DEPARTMENT AT [facility] Provider Note   CSN: [id number] Arrival date & time: 11/10/24  2127     Patient presents with: Dental Pain and Foot Pain   Nathan Norton is a 43 y.o. male with history of homelessness well-known to the emergency department presents to the ER today for evaluation of upper left dental pain for the past week. He reports he has still been able to eat. No trouble breathing or swallowing. No facial swelling, chest pain, or SOB. Denies any fever. He reports that it was cold outside and he wanted a place to lie down.    Dental Pain Associated symptoms: no congestion, no facial swelling, no fever and no neck pain   Foot Pain Pertinent negatives include no chest pain and no shortness of breath.       Prior to Admission medications   Medication Sig Start Date End Date Taking? Authorizing Provider  acetaminophen  (TYLENOL ) 325 MG tablet Take 2 tablets (650 mg total) by mouth every 6 (six) hours as needed. 11/05/24   Elnor Jayson LABOR, DO  amoxicillin -clavulanate (AUGMENTIN ) 875-125 MG tablet Take 1 tablet by mouth every 12 (twelve) hours. 04/08/23   Keith Sor, PA-C  cephALEXin  (KEFLEX ) 500 MG capsule Take 1 capsule (500 mg total) by mouth 4 (four) times daily. 02/26/23   [name], PA-C  clotrimazole  (LOTRIMIN ) 1 % cream Apply to affected area 2 times daily 03/17/23   [name], PA-C  clotrimazole  (LOTRIMIN ) 1 % cream Apply to affected area 2 times daily 11/06/24   Laurice Maude BROCKS, MD  diclofenac  (VOLTAREN) 25 MG EC tablet Take 1 tablet (25 mg total) by mouth 2 (two) times daily as needed. 11/05/24   Elnor Jayson LABOR, DO  diphenhydrAMINE  (BENADRYL ) 25 MG tablet Take 1 tablet (25 mg total) by mouth every 6 (six) hours as needed. 11/06/24   Laurice Maude BROCKS, MD  loratadine  (CLARITIN ) 10 MG tablet Take 1 tablet (10 mg total) by mouth daily as needed for allergies or rhinitis. 06/19/22   Petrucelli, Samantha R, PA-C  valACYclovir (VALTREX)  1000 MG tablet Take 1 tablet (1,000 mg total) by mouth 3 (three) times daily. 11/02/24   [name], DO    Allergies: Shellfish allergy    Review of Systems  Constitutional:  Negative for chills and fever.  HENT:  Positive for dental problem. Negative for congestion, facial swelling, rhinorrhea and trouble swallowing.   Respiratory:  Negative for shortness of breath.   Cardiovascular:  Negative for chest pain.  Musculoskeletal:  Negative for neck pain and neck stiffness.    Updated Vital Signs BP (!) 146/84   Pulse 99   Temp 98.8 F (37.1 C)   Resp 16   Ht 6' 3 (1.905 m)   Wt 95.3 kg   SpO2 98%   BMI 26.26 kg/m   Physical Exam Vitals and nursing note reviewed.  Constitutional:      General: He is not in acute distress.    Appearance: He is not ill-appearing or toxic-appearing.     Comments: Food at bedside, no acute distress  HENT:     Head:     Comments: No facial swelling    Mouth/Throat:     Mouth: Mucous membranes are moist.     Comments: Patient has dentition eroding past the gumline on the upper left side. No palpable fluctuance or induration. No trismus. Controlling secretions. Uvula midline. Airway patent. No sublingual elevation.  Eyes:  General: No scleral icterus. Pulmonary:     Effort: Pulmonary effort is normal. No respiratory distress.  Skin:    General: Skin is warm and dry.  Neurological:     Mental Status: He is alert.     (all labs ordered are listed, but only abnormal results are displayed) Labs Reviewed - No data to display  EKG: None  Radiology: No results found.  Procedures   Medications Ordered in the ED - No data to display  Medical Decision Making  43 y.o. male presents to the ER today for evaluation of dental pain and homelessness. Differential diagnosis includes but is not limited to dental pain, dental caries, abscess, ludwig's angina. Vital signs mildly elevated BP otherwise unremarkable. Physical exam as noted  above.   The patient presents to the ER for evaluation of dental pain for the past week. This is his sixth visit in the past 7 days. He is requesting food in the ER and has been able to eat without difficulty. No red flag symptoms or presentation concerning for deep space infection or Ludwig's. The patient has been able to rest for hours and is thankful. He was given food and drink. He did not mention any tingling to extremities. He is requesting a refill of his Valcyclovir incase of breakthrough. Will refill. Will send him in some Augmentin . Shelter and Dental resources provided.   We discussed plan at bedside. We discussed strict return precautions and red flag symptoms. The patient verbalized their understanding and agrees to the plan. The patient is stable and being discharged home in good condition.  Portions of this report may have been transcribed using voice recognition software. Every effort was made to ensure accuracy; however, inadvertent computerized transcription errors may be present.    Final diagnoses:  Dental caries  Homelessness    ED Discharge Orders          Ordered    amoxicillin -clavulanate (AUGMENTIN ) 875-125 MG tablet  Every 12 hours        11/11/24 0427    valACYclovir (VALTREX) 1000 MG tablet  3 times daily        11/11/24 0428               Bernis Ernst, PA-C 11/11/24 9562    Raford Lenis, MD 11/11/24 [phone]

## 2024-11-12 ENCOUNTER — Emergency Department (HOSPITAL_COMMUNITY)
Admission: EM | Admit: 2024-11-12 | Discharge: 2024-11-12 | Disposition: A | Discharge status: Home / Self Care | Attending: Emergency Medicine | Admitting: Emergency Medicine

## 2024-11-12 ENCOUNTER — Other Ambulatory Visit (HOSPITAL_COMMUNITY): Payer: Self-pay

## 2024-11-12 ENCOUNTER — Encounter (HOSPITAL_COMMUNITY): Payer: Self-pay

## 2024-11-12 ENCOUNTER — Other Ambulatory Visit: Payer: Self-pay

## 2024-11-12 DIAGNOSIS — Z760 Encounter for issue of repeat prescription: Secondary | ICD-10-CM | POA: Insufficient documentation

## 2024-11-12 DIAGNOSIS — E119 Type 2 diabetes mellitus without complications: Secondary | ICD-10-CM | POA: Diagnosis not present

## 2024-11-12 DIAGNOSIS — I10 Essential (primary) hypertension: Secondary | ICD-10-CM | POA: Insufficient documentation

## 2024-11-12 MED ORDER — DICLOFENAC SODIUM 25 MG PO TBEC: 25.0000 mg | DELAYED_RELEASE_TABLET | Freq: Two times a day (BID) | ORAL | 0 refills | Status: DC | PRN

## 2024-11-12 NOTE — Discharge Instructions (Signed)
 Your diclofenac  prescription have been refilled.

## 2024-11-12 NOTE — ED Provider Notes (Signed)
 [place] EMERGENCY DEPARTMENT AT [facility] Provider Note   CSN: [id number] Arrival date & time: 11/12/24  1814     Patient presents with: No chief complaint on file.   Nathan Norton is a 43 y.o. male.   The history is provided by the patient and medical records. No language interpreter was used.     43 year old male history of schizophrenia, hypertension, diabetes, malingering, presenting requesting for medication refill.  Patient states he is out of his chronic for which he used for his back pain.  Back pain is chronic.  States with weather changes that makes it worse.  Endorse having tremors and this is not new.  No other complaint.  Patient has been seen and evaluated in the ED multiple times in the past, most recent was yesterday.  Prior to Admission medications   Medication Sig Start Date End Date Taking? Authorizing Provider  acetaminophen  (TYLENOL ) 325 MG tablet Take 2 tablets (650 mg total) by mouth every 6 (six) hours as needed. 11/05/24   Elnor Jayson LABOR, DO  amoxicillin -clavulanate (AUGMENTIN ) 875-125 MG tablet Take 1 tablet by mouth every 12 (twelve) hours. 11/11/24   Bernis Ernst, PA-C  clotrimazole  (LOTRIMIN ) 1 % cream Apply to affected area 2 times daily 03/17/23   [name], PA-C  clotrimazole  (LOTRIMIN ) 1 % cream Apply to affected area 2 times daily 11/06/24   Laurice Maude BROCKS, MD  diclofenac  (VOLTAREN) 25 MG EC tablet Take 1 tablet (25 mg total) by mouth 2 (two) times daily as needed. 11/05/24   Elnor Jayson LABOR, DO  diphenhydrAMINE  (BENADRYL ) 25 MG tablet Take 1 tablet (25 mg total) by mouth every 6 (six) hours as needed. 11/06/24   Laurice Maude BROCKS, MD  loratadine  (CLARITIN ) 10 MG tablet Take 1 tablet (10 mg total) by mouth daily as needed for allergies or rhinitis. 06/19/22   Petrucelli, Samantha R, PA-C  valACYclovir (VALTREX) 1000 MG tablet Take 1 tablet (1,000 mg total) by mouth 3 (three) times daily. 11/11/24   Bernis Ernst, PA-C    Allergies:  Shellfish allergy    Review of Systems  Musculoskeletal:  Positive for back pain.    Updated Vital Signs BP (!) 155/81   Pulse (!) 108   Temp 98.5 F (36.9 C)   Resp 18   SpO2 94%   Physical Exam Constitutional:      General: He is not in acute distress.    Appearance: He is well-developed.     Comments: Sitting comfortably in no acute discomfort  HENT:     Head: Atraumatic.  Eyes:     Conjunctiva/sclera: Conjunctivae normal.  Musculoskeletal:        General: No tenderness (No midline spine tenderness).     Cervical back: Normal range of motion and neck supple.  Skin:    Findings: No rash.  Neurological:     Mental Status: He is alert.     Comments: No tremors     (all labs ordered are listed, but only abnormal results are displayed) Labs Reviewed - No data to display  EKG: None  Radiology: No results found.   Procedures   Medications Ordered in the ED - No data to display                                  Medical Decision Making  BP (!) 155/81   Pulse (!) 108   Temp 98.5  F (36.9 C)   Resp 18   SpO2 94%   6:64 PM  43 year old male history of schizophrenia, hypertension, diabetes, malingering, presenting requesting for medication refill.  Patient states he is out of his chronic for which he used for his back pain.  Back pain is chronic.  States with weather changes that makes it worse.  Endorse having tremors and this is not new.  No other complaint.  Patient has been seen and evaluated in the ED multiple times in the past, most recent was yesterday.  Exam overall reassuring.  Will provide short course of diclofenac .  Overall patient stable for discharge.     Final diagnoses:  Medication refill    ED Discharge Orders          Ordered    diclofenac  (VOLTAREN) 25 MG EC tablet  2 times daily PRN        11/12/24 1856               Nivia Colon, PA-C 11/12/24 1857    Elnor Jayson LABOR, DO 11/12/24 2336

## 2024-11-12 NOTE — ED Triage Notes (Addendum)
 Pt c/o low back pain and tremors; endorses hx same, taking medication for same but needs refill; no new injury; denies recent etoh or drug use  Pt gives verbal consent for mse

## 2024-11-13 ENCOUNTER — Encounter (HOSPITAL_COMMUNITY): Payer: Self-pay | Admitting: *Deleted

## 2024-11-13 ENCOUNTER — Emergency Department (HOSPITAL_COMMUNITY)
Admission: EM | Admit: 2024-11-13 | Discharge: 2024-11-13 | Disposition: A | Discharge status: Home / Self Care | Attending: Emergency Medicine | Admitting: Emergency Medicine

## 2024-11-13 DIAGNOSIS — R197 Diarrhea, unspecified: Secondary | ICD-10-CM | POA: Diagnosis present

## 2024-11-13 MED ORDER — ONDANSETRON 4 MG PO TBDP
8.0000 mg | ORAL_TABLET | Freq: Once | ORAL | Status: AC
  Administered 2024-11-13: 8 mg via ORAL
  Filled 2024-11-13: qty 2

## 2024-11-13 MED ORDER — ONDANSETRON HCL 4 MG PO TABS: 4.0000 mg | ORAL_TABLET | Freq: Four times a day (QID) | ORAL | 0 refills | Status: DC

## 2024-11-13 NOTE — ED Provider Notes (Addendum)
 [place] EMERGENCY DEPARTMENT AT [facility] Provider Note   CSN: [id number] Arrival date & time: 11/13/24  9942     Patient presents with: No chief complaint on file.  HPI Nathan Norton is a 43 y.o. male presenting for diarrhea.  He states has been going on for last couple of days and he is also had some nausea but no vomiting.  Denies abdominal pain and fever.  Denies trips outside the country.  States he is homeless at this time.  Also requesting something to eat, new socks and a bus pass.  Also states he has been intermittently dizzy but not dizzy at this time.   HPI     Prior to Admission medications   Medication Sig Start Date End Date Taking? Authorizing Provider  ondansetron  (ZOFRAN ) 4 MG tablet Take 1 tablet (4 mg total) by mouth every 6 (six) hours. 11/13/24  Yes Morey Andonian K, PA-C  acetaminophen  (TYLENOL ) 325 MG tablet Take 2 tablets (650 mg total) by mouth every 6 (six) hours as needed. 11/05/24   Elnor Jayson LABOR, DO  amoxicillin -clavulanate (AUGMENTIN ) 875-125 MG tablet Take 1 tablet by mouth every 12 (twelve) hours. 11/11/24   Bernis Ernst, PA-C  clotrimazole  (LOTRIMIN ) 1 % cream Apply to affected area 2 times daily 03/17/23   [name], PA-C  clotrimazole  (LOTRIMIN ) 1 % cream Apply to affected area 2 times daily 11/06/24   Laurice Maude BROCKS, MD  diclofenac  (VOLTAREN) 25 MG EC tablet Take 1 tablet (25 mg total) by mouth 2 (two) times daily as needed. 11/12/24   Nivia Colon, PA-C  diphenhydrAMINE  (BENADRYL ) 25 MG tablet Take 1 tablet (25 mg total) by mouth every 6 (six) hours as needed. 11/06/24   Laurice Maude BROCKS, MD  loratadine  (CLARITIN ) 10 MG tablet Take 1 tablet (10 mg total) by mouth daily as needed for allergies or rhinitis. 06/19/22   Petrucelli, Samantha R, PA-C  valACYclovir (VALTREX) 1000 MG tablet Take 1 tablet (1,000 mg total) by mouth 3 (three) times daily. 11/11/24   Bernis Ernst, PA-C    Allergies: Shellfish allergy    Review of  Systems See HPI  Updated Vital Signs BP (!) 129/92 (BP Location: Left Arm)   Pulse 66   Temp 97.6 F (36.4 C)   Resp 16   Ht 6' 3 (1.905 m)   Wt 95.3 kg   SpO2 100%   BMI 26.26 kg/m   Physical Exam Vitals and nursing note reviewed.  HENT:     Head: Normocephalic and atraumatic.     Mouth/Throat:     Mouth: Mucous membranes are moist.  Eyes:     General:        Right eye: No discharge.        Left eye: No discharge.     Conjunctiva/sclera: Conjunctivae normal.  Cardiovascular:     Rate and Rhythm: Normal rate and regular rhythm.     Pulses: Normal pulses.     Heart sounds: Normal heart sounds.  Pulmonary:     Effort: Pulmonary effort is normal.     Breath sounds: Normal breath sounds.  Abdominal:     General: Abdomen is flat. There is no distension.     Palpations: Abdomen is soft.     Tenderness: There is no abdominal tenderness.  Skin:    General: Skin is warm and dry.  Neurological:     General: No focal deficit present.     Comments: GCS 15. Speech is goal oriented.  No deficits appreciated to CN III-XII; symmetric eyebrow raise, no facial drooping, tongue midline. Patient has equal grip strength bilaterally with 5/5 strength against resistance in all major muscle groups bilaterally. Sensation to light touch intact. Patient moves extremities without ataxia. Normal finger-nose-finger. Patient ambulatory with steady gait.  Psychiatric:        Mood and Affect: Mood normal.     (all labs ordered are listed, but only abnormal results are displayed) Labs Reviewed - No data to display  EKG: None  Radiology: No results found.   Procedures   Medications Ordered in the ED  ondansetron  (ZOFRAN -ODT) disintegrating tablet 8 mg (has no administration in time range)                                    Medical Decision Making Risk Prescription drug management.   43 year old well-appearing male presenting for diarrhea.  Exam is unremarkable.  Patient is  well-known to the department and has been seen here in the ED 12 times in the last month.  He looks well, abdominal exam was benign and vitals are reassuring and there has been no witnessed vomiting here.  Fluid challenge with no issue.  Suspect he is here for secondary gain.  Did recommend that he continue assertive hydration at home and send Zofran  to his pharmacy.  Provided him socks and something to eat advised him to follow-up with his PCP.  Discharge.     Final diagnoses:  Diarrhea, unspecified type    ED Discharge Orders          Ordered    ondansetron  (ZOFRAN ) 4 MG tablet  Every 6 hours        11/13/24 0753               Edoardo Laforte K, PA-C 11/13/24 9192    Yolande Lamar BROCKS, MD 11/13/24 [phone]

## 2024-11-13 NOTE — Discharge Instructions (Addendum)
 Evaluation was overall reassuring.  Please follow-up with your PCP.  In the meantime recommend that you continue assertive hydration at home with water  and Gatorade and I sent Zofran  to your pharmacy if needed for nausea.

## 2024-11-13 NOTE — ED Triage Notes (Signed)
 This pt was here earlier today  for the same problem treated and  released    usually frequents this ed

## 2024-11-13 NOTE — ED Notes (Signed)
 Pt given sandwich and juice

## 2024-11-14 ENCOUNTER — Other Ambulatory Visit: Payer: Self-pay

## 2024-11-14 ENCOUNTER — Emergency Department (HOSPITAL_COMMUNITY)
Admission: EM | Admit: 2024-11-14 | Discharge: 2024-11-14 | Disposition: A | Discharge status: Home / Self Care | Attending: Emergency Medicine | Admitting: Emergency Medicine

## 2024-11-14 ENCOUNTER — Encounter (HOSPITAL_COMMUNITY): Payer: Self-pay

## 2024-11-14 ENCOUNTER — Encounter (HOSPITAL_COMMUNITY): Payer: Self-pay | Admitting: *Deleted

## 2024-11-14 DIAGNOSIS — X501XXA Overexertion from prolonged static or awkward postures, initial encounter: Secondary | ICD-10-CM | POA: Diagnosis not present

## 2024-11-14 DIAGNOSIS — I10 Essential (primary) hypertension: Secondary | ICD-10-CM | POA: Insufficient documentation

## 2024-11-14 DIAGNOSIS — T148XXA Other injury of unspecified body region, initial encounter: Secondary | ICD-10-CM

## 2024-11-14 DIAGNOSIS — E119 Type 2 diabetes mellitus without complications: Secondary | ICD-10-CM | POA: Insufficient documentation

## 2024-11-14 DIAGNOSIS — T699XXA Effect of reduced temperature, unspecified, initial encounter: Secondary | ICD-10-CM | POA: Diagnosis present

## 2024-11-14 DIAGNOSIS — Z5900 Homelessness unspecified: Secondary | ICD-10-CM | POA: Insufficient documentation

## 2024-11-14 DIAGNOSIS — S79911A Unspecified injury of right hip, initial encounter: Secondary | ICD-10-CM | POA: Diagnosis present

## 2024-11-14 DIAGNOSIS — S76011A Strain of muscle, fascia and tendon of right hip, initial encounter: Secondary | ICD-10-CM | POA: Diagnosis not present

## 2024-11-14 DIAGNOSIS — R197 Diarrhea, unspecified: Secondary | ICD-10-CM | POA: Diagnosis present

## 2024-11-14 DIAGNOSIS — T730XXA Starvation, initial encounter: Secondary | ICD-10-CM

## 2024-11-14 DIAGNOSIS — X31XXXA Exposure to excessive natural cold, initial encounter: Secondary | ICD-10-CM | POA: Insufficient documentation

## 2024-11-14 MED ORDER — METHOCARBAMOL 500 MG PO TABS: 500.0000 mg | ORAL_TABLET | Freq: Two times a day (BID) | ORAL | 0 refills | Status: DC

## 2024-11-14 MED ORDER — LOPERAMIDE HCL 2 MG PO CAPS
2.0000 mg | ORAL_CAPSULE | Freq: Once | ORAL | Status: AC
  Administered 2024-11-14: 2 mg via ORAL
  Filled 2024-11-14: qty 1

## 2024-11-14 NOTE — Discharge Instructions (Signed)
 Please establish housing at shelter.  Provided you with resource guide above

## 2024-11-14 NOTE — Discharge Instructions (Signed)
 You were seen in the ER today for evaluation of your diarrhea.  I given use Imodium here.  Please be mindful of your diet.  Make sure that you are staying well-hydrated.  I included some resources for shelters around the area.  If you have any concerns of any worsening symptoms, please return the nearest emerged ferment for reevaluation.  Contact a doctor if: You have a fever. Your diarrhea gets worse. You have new symptoms. You vomit every time you eat or drink. You feel light-headed, dizzy, or you have a headache. You have muscle cramps. You have signs of losing too much water  in your body, such as: Dark pee, very little pee, or no pee. Cracked lips. Dry mouth. Sunken eyes. Sleepiness. Weakness. You have bloody or black poop or poop that looks like tar. You have very bad pain, cramping, or bloating in your belly (abdomen). Your skin feels cold and clammy. You feel confused. Get help right away if: You have chest pain. Your heart is beating very quickly. You have trouble breathing or you are breathing very quickly. You feel very weak or you faint. These symptoms may be an emergency. Get help right away. Call 911. Do not wait to see if the symptoms will go away. Do not drive yourself to the hospital.

## 2024-11-14 NOTE — ED Notes (Signed)
 Pt stumbled and fell at registration desk after checking in.

## 2024-11-14 NOTE — ED Provider Notes (Signed)
 [place] EMERGENCY DEPARTMENT AT [facility] Provider Note   CSN: [id number] Arrival date & time: 11/14/24  2130     Patient presents with: Homeless   Nathan Norton is a 43 y.o. male with a past medical history of schizoaffective disorder, TBI, HTN, diabetes, polysubstance abuse presents to Emergency Department for being cold and needing somewhere to sleep.  Was already seen twice at [facility], ED today   HPI     Prior to Admission medications   Medication Sig Start Date End Date Taking? Authorizing Provider  acetaminophen  (TYLENOL ) 325 MG tablet Take 2 tablets (650 mg total) by mouth every 6 (six) hours as needed. 11/05/24   Elnor Jayson LABOR, DO  amoxicillin -clavulanate (AUGMENTIN ) 875-125 MG tablet Take 1 tablet by mouth every 12 (twelve) hours. 11/11/24   Bernis Ernst, PA-C  clotrimazole  (LOTRIMIN ) 1 % cream Apply to affected area 2 times daily 03/17/23   [name], PA-C  clotrimazole  (LOTRIMIN ) 1 % cream Apply to affected area 2 times daily 11/06/24   Laurice Maude BROCKS, MD  diclofenac  (VOLTAREN) 25 MG EC tablet Take 1 tablet (25 mg total) by mouth 2 (two) times daily as needed. 11/12/24   Nivia Colon, PA-C  diphenhydrAMINE  (BENADRYL ) 25 MG tablet Take 1 tablet (25 mg total) by mouth every 6 (six) hours as needed. 11/06/24   Laurice Maude BROCKS, MD  loratadine  (CLARITIN ) 10 MG tablet Take 1 tablet (10 mg total) by mouth daily as needed for allergies or rhinitis. 06/19/22   Petrucelli, Samantha R, PA-C  methocarbamol  (ROBAXIN ) 500 MG tablet Take 1 tablet (500 mg total) by mouth 2 (two) times daily. 11/14/24   [name], PA-C  ondansetron  (ZOFRAN ) 4 MG tablet Take 1 tablet (4 mg total) by mouth every 6 (six) hours. 11/13/24   Robinson, John K, PA-C  valACYclovir (VALTREX) 1000 MG tablet Take 1 tablet (1,000 mg total) by mouth 3 (three) times daily. 11/11/24   Bernis Ernst, PA-C    Allergies: Shellfish allergy    Review of Systems  Gastrointestinal:  Negative for  nausea.    Updated Vital Signs BP (!) 164/71   Pulse 83   Temp 98 F (36.7 C) (Oral)   Resp 18   SpO2 100%   Physical Exam Vitals and nursing note reviewed.  Constitutional:      General: He is not in acute distress.    Appearance: Normal appearance.  HENT:     Head: Normocephalic and atraumatic.  Eyes:     Conjunctiva/sclera: Conjunctivae normal.  Cardiovascular:     Rate and Rhythm: Normal rate.  Pulmonary:     Effort: Pulmonary effort is normal. No respiratory distress.     Breath sounds: Normal breath sounds.  Abdominal:     General: Bowel sounds are normal. There is no distension.     Palpations: Abdomen is soft.     Tenderness: There is no abdominal tenderness. There is no guarding or rebound.  Musculoskeletal:     Comments: Sensation and motor intact of all digits and toes of hand and feet bilaterally. No signs of gangrene, poor perfusion to extremities  Skin:    Coloration: Skin is not jaundiced or pale.  Neurological:     Mental Status: He is alert and oriented to person, place, and time. Mental status is at baseline.     (all labs ordered are listed, but only abnormal results are displayed) Labs Reviewed - No data to display  EKG: None  Radiology: No  results found.    Medications Ordered in the ED - No data to display                                  Medical Decision Making  Patient presents to the ED for concern of cold exposure, this involves an extensive number of treatment options, and is a complaint that carries with it a high risk of complications and morbidity.  The differential diagnosis includes gangrene, vascular abnormality, malingering    Co morbidities that complicate the patient evaluation  homelessness   Additional history obtained:  Additional history obtained from Nursing   External records from outside source obtained and reviewed including triage RN note    Problem List / ED Course:  Cold exposure Homelessness Do  not see any signs of gangrene, poor circulation to extremities Vital signs hemodynamically stable with no fever or tachycardia.  No hypothermia He is well-known to the department and has been seen twice today already at [facility], ED for various complaints.  Think that there is a component of malingering, homelessness.  He was provided with shelter resources at [place] from other ED visits.  I did also provide him with the shelter resources as well and encouraged him to seek out shelter for housing stability tomorrow   Reevaluation:  After the interventions noted above, I reevaluated the patient and found that they have :stayed the same   Social Determinants of Health:  homeless   Dispostion:  After consideration of the diagnostic results and the patients response to treatment, I feel that the patent would benefit from outpatient management and establish housing in shelter.  Provided patient with shelter resources on DC paperwork.  Final diagnoses:  Cold exposure, initial encounter  Homelessness    ED Discharge Orders     None        Minnie Tinnie BRAVO, PA 11/14/24 2305    Ellouise Fine K, DO 11/14/24 2308

## 2024-11-14 NOTE — ED Triage Notes (Signed)
 Pt c/o muscle strain. Difficult to hear, pt playing music while checking in.

## 2024-11-14 NOTE — ED Provider Notes (Signed)
 [place] EMERGENCY DEPARTMENT AT [facility] Provider Note   CSN: [id number] Arrival date & time: 11/14/24  9862     Patient presents with: No chief complaint on file.   Nathan Norton is a 43 y.o. male.   The history is provided by the patient and medical records.   43 year old male presenting to the ED with what he thinks is a muscle strain.  States he was working out at [employer] doing hip thrusts and felt a pull in his right hip.  Since then he has had pain with changing position and what feels like spasms.  He tried taking a leftover diclofenac  old muscle relaxer but did not have much relief.  He denies any numbness or weakness of the legs.  No bowel or bladder incontinence.  Reportedly patient stumbled while in the waiting room.  I did not personally witness this.  He did not strike his head and was able to get up and ambulate afterwards.  Prior to Admission medications   Medication Sig Start Date End Date Taking? Authorizing Provider  methocarbamol  (ROBAXIN ) 500 MG tablet Take 1 tablet (500 mg total) by mouth 2 (two) times daily. 11/14/24  Yes [name], PA-C  acetaminophen  (TYLENOL ) 325 MG tablet Take 2 tablets (650 mg total) by mouth every 6 (six) hours as needed. 11/05/24   Elnor Jayson LABOR, DO  amoxicillin -clavulanate (AUGMENTIN ) 875-125 MG tablet Take 1 tablet by mouth every 12 (twelve) hours. 11/11/24   Bernis Ernst, PA-C  clotrimazole  (LOTRIMIN ) 1 % cream Apply to affected area 2 times daily 03/17/23   [name], PA-C  clotrimazole  (LOTRIMIN ) 1 % cream Apply to affected area 2 times daily 11/06/24   Laurice Maude BROCKS, MD  diclofenac  (VOLTAREN) 25 MG EC tablet Take 1 tablet (25 mg total) by mouth 2 (two) times daily as needed. 11/12/24   Nivia Colon, PA-C  diphenhydrAMINE  (BENADRYL ) 25 MG tablet Take 1 tablet (25 mg total) by mouth every 6 (six) hours as needed. 11/06/24   Laurice Maude BROCKS, MD  loratadine  (CLARITIN ) 10 MG tablet Take 1 tablet (10 mg  total) by mouth daily as needed for allergies or rhinitis. 06/19/22   Petrucelli, Samantha R, PA-C  ondansetron  (ZOFRAN ) 4 MG tablet Take 1 tablet (4 mg total) by mouth every 6 (six) hours. 11/13/24   Robinson, John K, PA-C  valACYclovir (VALTREX) 1000 MG tablet Take 1 tablet (1,000 mg total) by mouth 3 (three) times daily. 11/11/24   Bernis Ernst, PA-C    Allergies: Shellfish allergy    Review of Systems  Musculoskeletal:  Positive for arthralgias.  All other systems reviewed and are negative.   Updated Vital Signs BP (!) 135/90 (BP Location: Left Leg)   Pulse (!) 108   Temp 97.9 F (36.6 C) (Oral)   Resp 20   Ht 6' 3 (1.905 m)   Wt 95.3 kg   SpO2 98%   BMI 26.26 kg/m   Physical Exam Vitals and nursing note reviewed.  Constitutional:      Appearance: He is well-developed.  HENT:     Head: Normocephalic and atraumatic.  Eyes:     Conjunctiva/sclera: Conjunctivae normal.     Pupils: Pupils are equal, round, and reactive to light.  Cardiovascular:     Rate and Rhythm: Normal rate and regular rhythm.     Heart sounds: Normal heart sounds.  Pulmonary:     Effort: Pulmonary effort is normal.     Breath sounds: Normal breath  sounds.  Abdominal:     General: Bowel sounds are normal.     Palpations: Abdomen is soft.  Musculoskeletal:        General: Normal range of motion.     Cervical back: Normal range of motion.     Comments: Right hip without acute deformity, no leg shortening or malrotation, seems to have some pain along hip flexor  Skin:    General: Skin is warm and dry.  Neurological:     Mental Status: He is alert and oriented to person, place, and time.     (all labs ordered are listed, but only abnormal results are displayed) Labs Reviewed - No data to display  EKG: None  Radiology: No results found.   Procedures   Medications Ordered in the ED - No data to display                                  Medical Decision Making Risk Prescription  drug management.   43 year old male presenting to the ED with right hip pain.  He reports he feels like he strained something while doing hip thrust at the gym with a lot of weight.  He does not have any bony deformity of the hip, seems to have some pain along the hip flexor but there is no significant swelling noted.  He remains ambulatory.  Can try muscle relaxer to see if this helps.  Of note, did have a stumble while at registration but did not have any head injury.  He was able to get up and ambulate afterwards.  Does not have any apparent injuries from this.  Stable for discharge.  Can follow-up with PCP.  Return here for new concerns.  Final diagnoses:  Muscle strain    ED Discharge Orders          Ordered    methocarbamol  (ROBAXIN ) 500 MG tablet  2 times daily        11/14/24 0214               [name], PA-C 11/14/24 0315    Mesner, Selinda, MD 11/14/24 [phone]

## 2024-11-14 NOTE — ED Triage Notes (Signed)
 Pt states that he needs somewhere to sleep tonight like he did last time.

## 2024-11-14 NOTE — Discharge Instructions (Signed)
 Take the prescribed medication as directed.  Heat may also help relax muscles. Follow-up with your doctor. Return to the ED for new or worsening symptoms.

## 2024-11-14 NOTE — ED Provider Notes (Signed)
 [place] EMERGENCY DEPARTMENT AT [facility] Provider Note   CSN: [id number] Arrival date & time: 11/14/24  1353     Patient presents with: Diarrhea   Nathan Norton is a 43 y.o. male presents to the ER today for evaluation of diarrhea episode today. He is also requesting food, drink, and bus pas. Denies any abdominal pain, nausea, or vomiting. Denies any recent black or bloody stools or fever. No other complaints. Patient is currently experiencing homelessness.   Diarrhea Associated symptoms: no abdominal pain, no chills, no fever and no vomiting        Prior to Admission medications   Medication Sig Start Date End Date Taking? Authorizing Provider  acetaminophen  (TYLENOL ) 325 MG tablet Take 2 tablets (650 mg total) by mouth every 6 (six) hours as needed. 11/05/24   Elnor Jayson LABOR, DO  amoxicillin -clavulanate (AUGMENTIN ) 875-125 MG tablet Take 1 tablet by mouth every 12 (twelve) hours. 11/11/24   Bernis Ernst, PA-C  clotrimazole  (LOTRIMIN ) 1 % cream Apply to affected area 2 times daily 03/17/23   [name], PA-C  clotrimazole  (LOTRIMIN ) 1 % cream Apply to affected area 2 times daily 11/06/24   Laurice Maude BROCKS, MD  diclofenac  (VOLTAREN) 25 MG EC tablet Take 1 tablet (25 mg total) by mouth 2 (two) times daily as needed. 11/12/24   Nivia Colon, PA-C  diphenhydrAMINE  (BENADRYL ) 25 MG tablet Take 1 tablet (25 mg total) by mouth every 6 (six) hours as needed. 11/06/24   Laurice Maude BROCKS, MD  loratadine  (CLARITIN ) 10 MG tablet Take 1 tablet (10 mg total) by mouth daily as needed for allergies or rhinitis. 06/19/22   Petrucelli, Samantha R, PA-C  methocarbamol  (ROBAXIN ) 500 MG tablet Take 1 tablet (500 mg total) by mouth 2 (two) times daily. 11/14/24   [name], PA-C  ondansetron  (ZOFRAN ) 4 MG tablet Take 1 tablet (4 mg total) by mouth every 6 (six) hours. 11/13/24   Robinson, John K, PA-C  valACYclovir (VALTREX) 1000 MG tablet Take 1 tablet (1,000 mg total) by mouth 3  (three) times daily. 11/11/24   Bernis Ernst, PA-C    Allergies: Shellfish allergy    Review of Systems  Constitutional:  Negative for chills and fever.  Gastrointestinal:  Positive for diarrhea. Negative for abdominal pain, blood in stool, nausea and vomiting.    Updated Vital Signs BP (!) 144/83 (BP Location: Right Arm)   Pulse 77   Temp 98 F (36.7 C)   Resp 16   SpO2 98%   Physical Exam Vitals and nursing note reviewed.  Constitutional:      General: He is not in acute distress.    Appearance: He is not ill-appearing or toxic-appearing.  Eyes:     General: No scleral icterus. Pulmonary:     Effort: Pulmonary effort is normal. No respiratory distress.  Abdominal:     General: Abdomen is flat.     Palpations: Abdomen is soft.     Tenderness: There is no abdominal tenderness.  Skin:    General: Skin is warm and dry.  Neurological:     Mental Status: He is alert.     Gait: Gait normal.     (all labs ordered are listed, but only abnormal results are displayed) Labs Reviewed - No data to display  EKG: None  Radiology: No results found.  Procedures   Medications Ordered in the ED  loperamide (IMODIUM) capsule 2 mg (has no administration in time range)    Medical  Decision Making Risk Prescription drug management.   43 y.o. male presents to the ER for evaluation of one diarrheal episode. Differential diagnosis includes but is not limited to Infectious diarrhea, GI Bleed, Appendicitis, Mesenteric Ischemia, Diverticulitis, endocrine causes (adrenal, thyroid), IBD. Vital signs unremarkable. Physical exam as noted above.   Patient is well known to the ER with 18 visits in the past 6 months. This is his second visit today for a different problem. This is his 13th visit this month alone. The patient does not appear in any acute distress. Denies any abdominal pain and his abdomen is soft and nontender. He is requesting food and a bus pass. He was given a dose of  immodium and food and a bus pass.  Given his abdomen is soft and nontender, he is hemodynamically stable, do not think labs are needed at this time.  He had 1 diarrheal episode.  Has been on antibiotics however he is not having frequent abdominal pain or diarrhea or fever, do not see any need for labs or CT imaging at this time.  We discussed the results of the labs/imaging. The plan is supportive care. We discussed strict return precautions and red flag symptoms. The patient verbalized their understanding and agrees to the plan. The patient is stable and being discharged home in good condition.  Portions of this report may have been transcribed using voice recognition software. Every effort was made to ensure accuracy; however, inadvertent computerized transcription errors may be present.    Final diagnoses:  Diarrhea, unspecified type  Hungry, initial encounter  Homelessness    ED Discharge Orders     None          Bernis Ernst, DEVONNA 11/14/24 2108    Jerrol Agent, MD 11/14/24 2112

## 2024-11-14 NOTE — ED Triage Notes (Signed)
 Pt states he is having diarrhea after taking Augmentin  he was prescribed 3 days ago

## 2024-11-15 ENCOUNTER — Emergency Department (HOSPITAL_COMMUNITY)
Admission: EM | Admit: 2024-11-15 | Discharge: 2024-11-16 | Disposition: A | Discharge status: Home / Self Care | Attending: Emergency Medicine | Admitting: Emergency Medicine

## 2024-11-15 ENCOUNTER — Other Ambulatory Visit: Payer: Self-pay

## 2024-11-15 ENCOUNTER — Encounter (HOSPITAL_COMMUNITY): Payer: Self-pay

## 2024-11-15 DIAGNOSIS — K219 Gastro-esophageal reflux disease without esophagitis: Secondary | ICD-10-CM | POA: Insufficient documentation

## 2024-11-15 DIAGNOSIS — E119 Type 2 diabetes mellitus without complications: Secondary | ICD-10-CM | POA: Insufficient documentation

## 2024-11-15 DIAGNOSIS — I10 Essential (primary) hypertension: Secondary | ICD-10-CM | POA: Insufficient documentation

## 2024-11-15 DIAGNOSIS — Z5900 Homelessness unspecified: Secondary | ICD-10-CM | POA: Insufficient documentation

## 2024-11-15 NOTE — ED Triage Notes (Signed)
 Pt reports that he has a cough since today. Pt reports being out in the cold. (Homeless)

## 2024-11-16 ENCOUNTER — Emergency Department (HOSPITAL_COMMUNITY)

## 2024-11-16 ENCOUNTER — Emergency Department (HOSPITAL_COMMUNITY)
Admission: EM | Admit: 2024-11-16 | Discharge: 2024-11-16 | Disposition: A | Discharge status: Home / Self Care | Source: Home / Self Care | Attending: Emergency Medicine | Admitting: Emergency Medicine

## 2024-11-16 ENCOUNTER — Encounter (HOSPITAL_COMMUNITY): Payer: Self-pay | Admitting: Emergency Medicine

## 2024-11-16 ENCOUNTER — Encounter (HOSPITAL_COMMUNITY): Payer: Self-pay

## 2024-11-16 ENCOUNTER — Other Ambulatory Visit: Payer: Self-pay

## 2024-11-16 ENCOUNTER — Emergency Department (HOSPITAL_COMMUNITY)
Admission: EM | Admit: 2024-11-16 | Discharge: 2024-11-17 | Disposition: A | Discharge status: Home / Self Care | Source: Home / Self Care | Attending: Emergency Medicine | Admitting: Emergency Medicine

## 2024-11-16 DIAGNOSIS — R0981 Nasal congestion: Secondary | ICD-10-CM | POA: Insufficient documentation

## 2024-11-16 DIAGNOSIS — Z5900 Homelessness unspecified: Secondary | ICD-10-CM | POA: Insufficient documentation

## 2024-11-16 DIAGNOSIS — K219 Gastro-esophageal reflux disease without esophagitis: Secondary | ICD-10-CM | POA: Diagnosis not present

## 2024-11-16 DIAGNOSIS — R109 Unspecified abdominal pain: Secondary | ICD-10-CM | POA: Insufficient documentation

## 2024-11-16 DIAGNOSIS — R110 Nausea: Secondary | ICD-10-CM | POA: Diagnosis not present

## 2024-11-16 DIAGNOSIS — R42 Dizziness and giddiness: Secondary | ICD-10-CM | POA: Insufficient documentation

## 2024-11-16 DIAGNOSIS — K7689 Other specified diseases of liver: Secondary | ICD-10-CM | POA: Insufficient documentation

## 2024-11-16 DIAGNOSIS — Z79899 Other long term (current) drug therapy: Secondary | ICD-10-CM | POA: Insufficient documentation

## 2024-11-16 DIAGNOSIS — R059 Cough, unspecified: Secondary | ICD-10-CM | POA: Diagnosis present

## 2024-11-16 DIAGNOSIS — I10 Essential (primary) hypertension: Secondary | ICD-10-CM | POA: Insufficient documentation

## 2024-11-16 DIAGNOSIS — E119 Type 2 diabetes mellitus without complications: Secondary | ICD-10-CM | POA: Insufficient documentation

## 2024-11-16 DIAGNOSIS — R7401 Elevation of levels of liver transaminase levels: Secondary | ICD-10-CM | POA: Insufficient documentation

## 2024-11-16 DIAGNOSIS — R160 Hepatomegaly, not elsewhere classified: Secondary | ICD-10-CM

## 2024-11-16 LAB — CBC
HCT: 38.2 % — ABNORMAL LOW (ref 39.0–52.0)
Hemoglobin: 12.2 g/dL — ABNORMAL LOW (ref 13.0–17.0)
MCH: 32.4 pg (ref 26.0–34.0)
MCHC: 31.9 g/dL (ref 30.0–36.0)
MCV: 101.6 fL — ABNORMAL HIGH (ref 80.0–100.0)
Platelets: 336 K/uL (ref 150–400)
RBC: 3.76 MIL/uL — ABNORMAL LOW (ref 4.22–5.81)
RDW: 15.4 % (ref 11.5–15.5)
WBC: 5.5 K/uL (ref 4.0–10.5)
nRBC: 0 % (ref 0.0–0.2)

## 2024-11-16 LAB — COMPREHENSIVE METABOLIC PANEL WITH GFR
ALT: 79 U/L — ABNORMAL HIGH (ref 0–44)
AST: 101 U/L — ABNORMAL HIGH (ref 15–41)
Albumin: 3.4 g/dL — ABNORMAL LOW (ref 3.5–5.0)
Alkaline Phosphatase: 65 U/L (ref 38–126)
Anion gap: 14 (ref 5–15)
BUN: 9 mg/dL (ref 6–20)
CO2: 19 mmol/L — ABNORMAL LOW (ref 22–32)
Calcium: 8.6 mg/dL — ABNORMAL LOW (ref 8.9–10.3)
Chloride: 104 mmol/L (ref 98–111)
Creatinine, Ser: 1.31 mg/dL — ABNORMAL HIGH (ref 0.61–1.24)
GFR, Estimated: >60 mL/min (ref >60)
Glucose, Bld: 97 mg/dL (ref 70–99)
Potassium: 4.1 mmol/L (ref 3.5–5.1)
Sodium: 137 mmol/L (ref 135–145)
Total Bilirubin: 0.7 mg/dL (ref 0.0–1.2)
Total Protein: 6.5 g/dL (ref 6.5–8.1)

## 2024-11-16 LAB — URINALYSIS, ROUTINE W REFLEX MICROSCOPIC
Bilirubin Urine: NEGATIVE
Glucose, UA: NEGATIVE mg/dL
Hgb urine dipstick: NEGATIVE
Ketones, ur: NEGATIVE mg/dL
Leukocytes,Ua: NEGATIVE
Nitrite: NEGATIVE
Protein, ur: NEGATIVE mg/dL
Specific Gravity, Urine: 1.012 (ref 1.005–1.030)
pH: 6 (ref 5.0–8.0)

## 2024-11-16 LAB — ETHANOL: Alcohol, Ethyl (B): <15 mg/dL (ref <15)

## 2024-11-16 LAB — ACETAMINOPHEN LEVEL: Acetaminophen (Tylenol), Serum: <10 ug/mL — ABNORMAL LOW (ref 10–30)

## 2024-11-16 LAB — HEPATITIS PANEL, ACUTE
HCV Ab: NONREACTIVE
Hep A IgM: NONREACTIVE
Hep B C IgM: NONREACTIVE
Hepatitis B Surface Ag: NONREACTIVE

## 2024-11-16 LAB — LIPASE, BLOOD: Lipase: 61 U/L — ABNORMAL HIGH (ref 11–51)

## 2024-11-16 LAB — CBG MONITORING, ED: Glucose-Capillary: 148 mg/dL — ABNORMAL HIGH (ref 70–99)

## 2024-11-16 MED ORDER — PANTOPRAZOLE SODIUM 40 MG PO TBEC
40.0000 mg | DELAYED_RELEASE_TABLET | Freq: Once | ORAL | Status: AC
  Administered 2024-11-16: 40 mg via ORAL
  Filled 2024-11-16: qty 1

## 2024-11-16 MED ORDER — PANTOPRAZOLE SODIUM 40 MG PO TBEC: 40.0000 mg | DELAYED_RELEASE_TABLET | Freq: Every day | ORAL | 0 refills | Status: DC

## 2024-11-16 MED ORDER — ALUM & MAG HYDROXIDE-SIMETH 200-200-20 MG/5ML PO SUSP
30.0000 mL | Freq: Once | ORAL | Status: AC
  Administered 2024-11-16: 30 mL via ORAL
  Filled 2024-11-16: qty 30

## 2024-11-16 MED ORDER — IOHEXOL 350 MG/ML SOLN
75.0000 mL | Freq: Once | INTRAVENOUS | Status: AC | PRN
  Administered 2024-11-16: 75 mL via INTRAVENOUS

## 2024-11-16 NOTE — Discharge Instructions (Addendum)
 Your liver enzymes are mildly elevated.  May be due to alcohol use or some other cause.  Drink less.  Follow-up with your doctor.  There also was a lesion in the left part of your liver.  Will need to follow-up with your primary care doctor and get a liver MRI for this but does not to be worked up today.

## 2024-11-16 NOTE — ED Provider Notes (Signed)
 [place] EMERGENCY DEPARTMENT AT [facility] Provider Note   CSN: [id number] Arrival date & time: 11/16/24  9383     Patient presents with: Abdominal Pain and Diarrhea   Nathan Norton is a 43 y.o. male.    Abdominal Pain Associated symptoms: diarrhea   Diarrhea Associated symptoms: abdominal pain   Patient presents with abdominal pain diffuse.  Also diarrhea.  Has had symptoms for a long time now.  Has had 21 visits to the ER in the last 6 months including now 16 visits in the last 18 days.  States [birth year] today.  Denies heavy alcohol use.  Denies Tylenol  use.  States just having the diarrhea.  Patient is homeless.     Prior to Admission medications   Medication Sig Start Date End Date Taking? Authorizing Provider  acetaminophen  (TYLENOL ) 325 MG tablet Take 2 tablets (650 mg total) by mouth every 6 (six) hours as needed. 11/05/24   Elnor Jayson LABOR, DO  amoxicillin -clavulanate (AUGMENTIN ) 875-125 MG tablet Take 1 tablet by mouth every 12 (twelve) hours. 11/11/24   Bernis Ernst, PA-C  clotrimazole  (LOTRIMIN ) 1 % cream Apply to affected area 2 times daily 03/17/23   [name], PA-C  clotrimazole  (LOTRIMIN ) 1 % cream Apply to affected area 2 times daily 11/06/24   Laurice Maude BROCKS, MD  diclofenac  (VOLTAREN) 25 MG EC tablet Take 1 tablet (25 mg total) by mouth 2 (two) times daily as needed. 11/12/24   Nivia Colon, PA-C  diphenhydrAMINE  (BENADRYL ) 25 MG tablet Take 1 tablet (25 mg total) by mouth every 6 (six) hours as needed. 11/06/24   Laurice Maude BROCKS, MD  loratadine  (CLARITIN ) 10 MG tablet Take 1 tablet (10 mg total) by mouth daily as needed for allergies or rhinitis. 06/19/22   Petrucelli, Samantha R, PA-C  methocarbamol  (ROBAXIN ) 500 MG tablet Take 1 tablet (500 mg total) by mouth 2 (two) times daily. 11/14/24   [name], PA-C  ondansetron  (ZOFRAN ) 4 MG tablet Take 1 tablet (4 mg total) by mouth every 6 (six) hours. 11/13/24   Robinson, John K,  PA-C  pantoprazole  (PROTONIX ) 40 MG tablet Take 1 tablet (40 mg total) by mouth daily. 11/16/24   Raford Lenis, MD  valACYclovir (VALTREX) 1000 MG tablet Take 1 tablet (1,000 mg total) by mouth 3 (three) times daily. 11/11/24   Bernis Ernst, PA-C    Allergies: Shellfish allergy    Review of Systems  Gastrointestinal:  Positive for abdominal pain and diarrhea.    Updated Vital Signs BP 127/88 (BP Location: Right Arm)   Pulse 76   Temp 98.2 F (36.8 C) (Temporal)   Resp 19   Ht 6' 3 (1.905 m)   Wt 95.3 kg   SpO2 97%   BMI 26.26 kg/m   Physical Exam Vitals and nursing note reviewed.  Eyes:     General: No scleral icterus. Cardiovascular:     Rate and Rhythm: Normal rate and regular rhythm.  Abdominal:     Tenderness: There is no abdominal tenderness.  Skin:    General: Skin is warm.     Coloration: Skin is not jaundiced.  Neurological:     Mental Status: He is alert.     (all labs ordered are listed, but only abnormal results are displayed) Labs Reviewed  LIPASE, BLOOD - Abnormal; Notable for the following components:      Result Value   Lipase 61 (*)    All other components within normal limits  COMPREHENSIVE METABOLIC PANEL WITH GFR - Abnormal; Notable for the following components:   CO2 19 (*)    Creatinine, Ser 1.31 (*)    Calcium 8.6 (*)    Albumin 3.4 (*)    AST 101 (*)    ALT 79 (*)    All other components within normal limits  CBC - Abnormal; Notable for the following components:   RBC 3.76 (*)    Hemoglobin 12.2 (*)    HCT 38.2 (*)    MCV 101.6 (*)    All other components within normal limits  URINALYSIS, ROUTINE W REFLEX MICROSCOPIC - Abnormal; Notable for the following components:   Color, Urine STRAW (*)    All other components within normal limits  ACETAMINOPHEN  LEVEL - Abnormal; Notable for the following components:   Acetaminophen  (Tylenol ), Serum <10 (*)    All other components within normal limits  ETHANOL  HEPATITIS PANEL, ACUTE     EKG: None  Radiology: CT ABDOMEN PELVIS W CONTRAST Result Date: 11/16/2024 EXAM: CT ABDOMEN AND PELVIS WITH CONTRAST 11/16/2024 11:54:00 AM TECHNIQUE: CT of the abdomen and pelvis was performed with the administration of 75 mL of iohexol (OMNIPAQUE) 350 MG/ML injection. Multiplanar reformatted images are provided for review. Automated exposure control, iterative reconstruction, and/or weight-based adjustment of the mA/kV was utilized to reduce the radiation dose to as low as reasonably achievable. COMPARISON: None available. CLINICAL HISTORY: Diarrhea; Liver enzymes and lipase elevated. FINDINGS: LOWER CHEST: No acute abnormality. LIVER: 16 x 12 mm heterogeneously enhancing abnormality noted anteriorly left hepatic lobe which may represent hemangioma with MRI is recommended for further evaluation and need to rule out other pathology. GALLBLADDER AND BILE DUCTS: Gallbladder is unremarkable. No biliary ductal dilatation. SPLEEN: No acute abnormality. PANCREAS: No acute abnormality. ADRENAL GLANDS: No acute abnormality. KIDNEYS, URETERS AND BLADDER: No stones in the kidneys or ureters. No hydronephrosis. No perinephric or periureteral stranding. Urinary bladder is unremarkable. GI AND BOWEL: Stomach demonstrates no acute abnormality. There is no bowel obstruction. PERITONEUM AND RETROPERITONEUM: No ascites. No free air. VASCULATURE: Aorta is normal in caliber. LYMPH NODES: No lymphadenopathy. REPRODUCTIVE ORGANS: No acute abnormality. BONES AND SOFT TISSUES: No acute osseous abnormality. No focal soft tissue abnormality. IMPRESSION: 1. 16 x 12 mm heterogeneously enhancing lesion in the anterior left hepatic lobe, indeterminate; recommend liver MRI for further characterization to evaluate for hemangioma versus other pathology. Electronically signed by: Lynwood Seip MD 11/16/2024 12:15 PM EST RP Workstation: [id number]     Procedures   Medications Ordered in the ED  iohexol (OMNIPAQUE) 350 MG/ML  injection 75 mL (75 mLs Intravenous Contrast Given 11/16/24 1150)                                    Medical Decision Making Amount and/or Complexity of Data Reviewed Labs: ordered. Radiology: ordered.  Risk Prescription drug management.   Patient presented with abdominal pain and diarrhea.  Lab work somewhat different than baseline.  LFTs mildly elevated with AST of 100 and ALT of 79.  Also with lipase of 60.  Differential diagnose does include cause such as alcohol use, which patient denies heavy use but reviewing records appears does have multiple visits with a high alcohol level in the ER.  Also cause such as hepatitis or Tylenol  use.  CT scan done and overall reassuring does show potentially hepatic lesion that needs following.  Do not think it needs to be done  today however.  Can follow-up PCP.  I think likely changes in the blood work from alcohol use.  Viral testing is pending.  Appears stable for discharge home however       Final diagnoses:  Transaminitis  Liver mass    ED Discharge Orders     None          Patsey Lot, MD 11/16/24 1222

## 2024-11-16 NOTE — ED Notes (Signed)
 Patient transported to CT

## 2024-11-16 NOTE — ED Provider Notes (Signed)
 [place] EMERGENCY DEPARTMENT AT [facility] Provider Note   CSN: [id number] Arrival date & time: 11/15/24  2120     Patient presents with: Cough   Nathan Norton is a 43 y.o. male.   The history is provided by the patient.  Cough  He has history of hypertension, diabetes, schizoaffective disorder, homelessness and comes in complaining of acid reflux.  He states that the cold is making it worse.  He denies nausea or vomiting.  On further questioning, he admits that he really has come in for a warm place to stay.    Prior to Admission medications   Medication Sig Start Date End Date Taking? Authorizing Provider  pantoprazole  (PROTONIX ) 40 MG tablet Take 1 tablet (40 mg total) by mouth daily. 11/16/24  Yes Raford Lenis, MD  acetaminophen  (TYLENOL ) 325 MG tablet Take 2 tablets (650 mg total) by mouth every 6 (six) hours as needed. 11/05/24   Elnor Jayson LABOR, DO  amoxicillin -clavulanate (AUGMENTIN ) 875-125 MG tablet Take 1 tablet by mouth every 12 (twelve) hours. 11/11/24   Bernis Ernst, PA-C  clotrimazole  (LOTRIMIN ) 1 % cream Apply to affected area 2 times daily 03/17/23   [name], PA-C  clotrimazole  (LOTRIMIN ) 1 % cream Apply to affected area 2 times daily 11/06/24   Laurice Maude BROCKS, MD  diclofenac  (VOLTAREN) 25 MG EC tablet Take 1 tablet (25 mg total) by mouth 2 (two) times daily as needed. 11/12/24   Nivia Colon, PA-C  diphenhydrAMINE  (BENADRYL ) 25 MG tablet Take 1 tablet (25 mg total) by mouth every 6 (six) hours as needed. 11/06/24   Laurice Maude BROCKS, MD  loratadine  (CLARITIN ) 10 MG tablet Take 1 tablet (10 mg total) by mouth daily as needed for allergies or rhinitis. 06/19/22   Petrucelli, Samantha R, PA-C  methocarbamol  (ROBAXIN ) 500 MG tablet Take 1 tablet (500 mg total) by mouth 2 (two) times daily. 11/14/24   [name], PA-C  ondansetron  (ZOFRAN ) 4 MG tablet Take 1 tablet (4 mg total) by mouth every 6 (six) hours. 11/13/24   Robinson, John K, PA-C   valACYclovir (VALTREX) 1000 MG tablet Take 1 tablet (1,000 mg total) by mouth 3 (three) times daily. 11/11/24   Bernis Ernst, PA-C    Allergies: Shellfish allergy    Review of Systems  Respiratory:  Positive for cough.   All other systems reviewed and are negative.   Updated Vital Signs BP 134/84   Pulse 91   Temp 98.3 F (36.8 C) (Oral)   Resp 16   SpO2 95%   Physical Exam Vitals and nursing note reviewed.   43 year old male, resting comfortably and in no acute distress. Vital signs are normal. Oxygen saturation is 95%, which is normal. Head is normocephalic and atraumatic. PERRLA, EOMI. Oropharynx is clear. Lungs are clear without rales, wheezes, or rhonchi. Chest is nontender. Heart has regular rate and rhythm without murmur. Abdomen is soft, flat, nontender. Skin is warm and dry without rash. Neurologic: Awake and alert, moves all extremities equally.    Procedures   Medications Ordered in the ED  pantoprazole  (PROTONIX ) EC tablet 40 mg (40 mg Oral Given 11/16/24 0119)  alum & mag hydroxide-simeth (MAALOX/MYLANTA) 200-200-20 MG/5ML suspension 30 mL (30 mLs Oral Given 11/16/24 0119)                                    Medical Decision Making Risk  OTC drugs. Prescription drug management.   GERD.  Homeless.  I reviewed his past records, and note ED visit yesterday where the complaint was also he was looking for a warm place to stay for the night.  I have ordered a dose of pantoprazole  and I am discharging him with a prescription for pantoprazole  and also resource guide for homeless shelters.     Final diagnoses:  Gastroesophageal reflux disease, unspecified whether esophagitis present  Homeless    ED Discharge Orders          Ordered    pantoprazole  (PROTONIX ) 40 MG tablet  Daily        11/16/24 0113               Raford Lenis, MD 11/16/24 0131

## 2024-11-16 NOTE — ED Triage Notes (Signed)
 Patient reports dizziness that started while working out. No focal weakness. Alert and oriented x4.

## 2024-11-16 NOTE — ED Triage Notes (Signed)
 Pt arrived via [facility] c/o abd pain 7/10 pain scale with x 4 diarrhea.

## 2024-11-16 NOTE — Discharge Instructions (Addendum)
 Please make sure that you are in a homeless shelter before it closes its doors for the night.

## 2024-11-17 ENCOUNTER — Emergency Department (HOSPITAL_COMMUNITY)
Admission: EM | Admit: 2024-11-17 | Discharge: 2024-11-17 | Disposition: A | Discharge status: Home / Self Care | Attending: Emergency Medicine | Admitting: Emergency Medicine

## 2024-11-17 ENCOUNTER — Other Ambulatory Visit (HOSPITAL_COMMUNITY): Payer: Self-pay

## 2024-11-17 ENCOUNTER — Other Ambulatory Visit: Payer: Self-pay

## 2024-11-17 ENCOUNTER — Encounter (HOSPITAL_COMMUNITY): Payer: Self-pay | Admitting: Emergency Medicine

## 2024-11-17 DIAGNOSIS — X58XXXA Exposure to other specified factors, initial encounter: Secondary | ICD-10-CM | POA: Diagnosis not present

## 2024-11-17 DIAGNOSIS — Z5900 Homelessness unspecified: Secondary | ICD-10-CM | POA: Diagnosis not present

## 2024-11-17 DIAGNOSIS — I10 Essential (primary) hypertension: Secondary | ICD-10-CM | POA: Diagnosis not present

## 2024-11-17 DIAGNOSIS — E119 Type 2 diabetes mellitus without complications: Secondary | ICD-10-CM | POA: Insufficient documentation

## 2024-11-17 DIAGNOSIS — Z760 Encounter for issue of repeat prescription: Secondary | ICD-10-CM | POA: Insufficient documentation

## 2024-11-17 DIAGNOSIS — S5012XA Contusion of left forearm, initial encounter: Secondary | ICD-10-CM | POA: Insufficient documentation

## 2024-11-17 MED ORDER — ONDANSETRON HCL 4 MG PO TABS: 4.0000 mg | ORAL_TABLET | Freq: Four times a day (QID) | ORAL | 0 refills | Status: DC

## 2024-11-17 MED ORDER — METHOCARBAMOL 500 MG PO TABS: 500.0000 mg | ORAL_TABLET | Freq: Two times a day (BID) | ORAL | 0 refills | Status: DC

## 2024-11-17 NOTE — ED Notes (Signed)
 ED Provider at bedside.
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
## 2024-11-17 NOTE — Discharge Instructions (Addendum)
 Your evaluated in the emergency room for dizziness, nausea and nasal congestion.  Please follow-up with your primary care doctor for further evaluation.
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
## 2024-11-17 NOTE — Discharge Instructions (Addendum)
 As we discussed, your workup in the ER today was reassuring for acute findings.  I have refilled your medications per your request.  Do not drive or operate machinery while taking Robaxin  as it can be sedating.  They area on your forearm is likely a bruise from your IV yesterday.  You can take Tylenol /ibuprofen  as needed for pain.  Return if development of any new or worsening symptoms

## 2024-11-17 NOTE — ED Triage Notes (Signed)
 Patient reports issue on his left arm where he was stuck for labs last night and requesting refill on zofran  and amoxicillin .

## 2024-11-20 ENCOUNTER — Emergency Department (HOSPITAL_COMMUNITY)
Admission: EM | Admit: 2024-11-20 | Discharge: 2024-11-21 | Disposition: A | Discharge status: Home / Self Care | Source: Home / Self Care

## 2024-11-20 ENCOUNTER — Emergency Department (HOSPITAL_COMMUNITY)
Admission: EM | Admit: 2024-11-20 | Discharge: 2024-11-20 | Disposition: A | Discharge status: Home / Self Care | Attending: Emergency Medicine | Admitting: Emergency Medicine

## 2024-11-20 ENCOUNTER — Other Ambulatory Visit: Payer: Self-pay

## 2024-11-20 DIAGNOSIS — F10120 Alcohol abuse with intoxication, uncomplicated: Secondary | ICD-10-CM | POA: Insufficient documentation

## 2024-11-20 DIAGNOSIS — Z5900 Homelessness unspecified: Secondary | ICD-10-CM | POA: Insufficient documentation

## 2024-11-20 DIAGNOSIS — F10920 Alcohol use, unspecified with intoxication, uncomplicated: Secondary | ICD-10-CM | POA: Diagnosis present

## 2024-11-20 NOTE — ED Provider Notes (Signed)
 [place] EMERGENCY DEPARTMENT AT [facility] Provider Note   CSN: [id number] Arrival date & time: 11/20/24  1548     Patient presents with: Alcohol Intoxication   Nathan Norton is a 43 y.o. male.  {Add pertinent medical, surgical, social history, OB history to HPI:3171} 43 year old male with past medical history of alcohol abuse and homelessness who is well-known to the emergency department presenting to the emergency department today after he was found intoxicated and was brought in by medics.  The patient does state that he started drinking again after he was discharged this morning.  He denies any trauma.  Denies any complaints currently but states that he is tired.   Alcohol Intoxication       Prior to Admission medications   Medication Sig Start Date End Date Taking? Authorizing Provider  acetaminophen  (TYLENOL ) 325 MG tablet Take 2 tablets (650 mg total) by mouth every 6 (six) hours as needed. 11/05/24   Elnor Jayson LABOR, DO  amoxicillin -clavulanate (AUGMENTIN ) 875-125 MG tablet Take 1 tablet by mouth every 12 (twelve) hours. 11/11/24   Bernis Ernst, PA-C  clotrimazole  (LOTRIMIN ) 1 % cream Apply to affected area 2 times daily 03/17/23   [name], PA-C  clotrimazole  (LOTRIMIN ) 1 % cream Apply to affected area 2 times daily 11/06/24   Laurice Maude BROCKS, MD  diclofenac  (VOLTAREN ) 25 MG EC tablet Take 1 tablet (25 mg total) by mouth 2 (two) times daily as needed. 11/12/24   Nivia Colon, PA-C  diphenhydrAMINE  (BENADRYL ) 25 MG tablet Take 1 tablet (25 mg total) by mouth every 6 (six) hours as needed. 11/06/24   Laurice Maude BROCKS, MD  loratadine  (CLARITIN ) 10 MG tablet Take 1 tablet (10 mg total) by mouth daily as needed for allergies or rhinitis. 06/19/22   Petrucelli, Samantha R, PA-C  methocarbamol  (ROBAXIN ) 500 MG tablet Take 1 tablet (500 mg total) by mouth 2 (two) times daily. 11/17/24   Smoot, Lauraine LABOR, PA-C  ondansetron  (ZOFRAN ) 4 MG tablet Take 1 tablet (4 mg  total) by mouth every 6 (six) hours. 11/17/24   Smoot, Lauraine LABOR, PA-C  pantoprazole  (PROTONIX ) 40 MG tablet Take 1 tablet (40 mg total) by mouth daily. 11/16/24   Raford Lenis, MD  valACYclovir  (VALTREX ) 1000 MG tablet Take 1 tablet (1,000 mg total) by mouth 3 (three) times daily. 11/11/24   Bernis Ernst, PA-C    Allergies: Shellfish allergy    Review of Systems  All other systems reviewed and are negative.   Updated Vital Signs BP 113/77 (BP Location: Right Arm)   Pulse 89   Temp 97.6 F (36.4 C) (Oral)   Resp 18   SpO2 100%   Physical Exam Vitals and nursing note reviewed.   Gen: NAD, slurred speech noted Eyes: PERRL, EOMI HEENT: no oropharyngeal swelling, no signs of head trauma Neck: trachea midline Resp: clear to auscultation bilaterally Card: RRR, no murmurs, rubs, or gallops Abd: nontender, nondistended Extremities: no calf tenderness, no edema Vascular: 2+ radial pulses bilaterally, 2+ DP pulses bilaterally Neuro: Cranial nerves intact, the patient does have a slightly unsteady gait on initial evaluation but was able to transfer with minimal assistance to the stretcher Skin: no rashes Psyc: acting appropriately   (all labs ordered are listed, but only abnormal results are displayed) Labs Reviewed - No data to display  EKG: None  Radiology: No results found.  {Document cardiac monitor, telemetry assessment procedure when appropriate:32947} Procedures   Medications Ordered in the ED - No data  to display    {Click here for ABCD2, HEART and other calculators REFRESH Note before signing:1}                              Medical Decision Making 43 year old male with past medical history of alcohol abuse and homelessness presenting to the emergency department today with alcohol intoxication.  Patient's vital signs are stable here.  He has presented very similarly in the past.  Will hold off on any workup at this time and allow the patient to metabolize the alcohol  in a controlled setting here.  The patient is clinically sober I think that he may be discharged.   ***  {Document critical care time when appropriate  Document review of labs and clinical decision tools ie CHADS2VASC2, etc  Document your independent review of radiology images and any outside records  Document your discussion with family members, caretakers and with consultants  Document social determinants of health affecting pt's care  Document your decision making why or why not admission, treatments were needed:32947:::1}   Final diagnoses:  None    ED Discharge Orders     None

## 2024-11-20 NOTE — ED Triage Notes (Signed)
 Patient arrived with EMS from a parking lot intoxicated with ETOH . No complaint at triage , respirations unlabored / ambulatory .

## 2024-11-20 NOTE — ED Provider Notes (Signed)
 [place] EMERGENCY DEPARTMENT AT [facility] Provider Note   CSN: [id number] Arrival date & time: 11/20/24  0455     Patient presents with: ETOH intoxication    Nathan Norton is a 43 y.o. male.   Presents with acute alcohol intoxication, nowhere to go       Prior to Admission medications   Medication Sig Start Date End Date Taking? Authorizing Provider  acetaminophen  (TYLENOL ) 325 MG tablet Take 2 tablets (650 mg total) by mouth every 6 (six) hours as needed. 11/05/24   Elnor Jayson LABOR, DO  amoxicillin -clavulanate (AUGMENTIN ) 875-125 MG tablet Take 1 tablet by mouth every 12 (twelve) hours. 11/11/24   Bernis Ernst, PA-C  clotrimazole  (LOTRIMIN ) 1 % cream Apply to affected area 2 times daily 03/17/23   [name], PA-C  clotrimazole  (LOTRIMIN ) 1 % cream Apply to affected area 2 times daily 11/06/24   Laurice Maude BROCKS, MD  diclofenac  (VOLTAREN ) 25 MG EC tablet Take 1 tablet (25 mg total) by mouth 2 (two) times daily as needed. 11/12/24   Nivia Colon, PA-C  diphenhydrAMINE  (BENADRYL ) 25 MG tablet Take 1 tablet (25 mg total) by mouth every 6 (six) hours as needed. 11/06/24   Laurice Maude BROCKS, MD  loratadine  (CLARITIN ) 10 MG tablet Take 1 tablet (10 mg total) by mouth daily as needed for allergies or rhinitis. 06/19/22   Petrucelli, Samantha R, PA-C  methocarbamol  (ROBAXIN ) 500 MG tablet Take 1 tablet (500 mg total) by mouth 2 (two) times daily. 11/17/24   Smoot, Lauraine LABOR, PA-C  ondansetron  (ZOFRAN ) 4 MG tablet Take 1 tablet (4 mg total) by mouth every 6 (six) hours. 11/17/24   Smoot, Lauraine LABOR, PA-C  pantoprazole  (PROTONIX ) 40 MG tablet Take 1 tablet (40 mg total) by mouth daily. 11/16/24   Raford Lenis, MD  valACYclovir  (VALTREX ) 1000 MG tablet Take 1 tablet (1,000 mg total) by mouth 3 (three) times daily. 11/11/24   Bernis Ernst, PA-C    Allergies: Shellfish allergy    Review of Systems  Updated Vital Signs BP (!) 128/94 (BP Location: Right Arm)   Pulse 81   Temp  98 F (36.7 C)   Resp 18   SpO2 98%   Physical Exam Vitals and nursing note reviewed.  HENT:     Head: Atraumatic.  Pulmonary:     Effort: Pulmonary effort is normal.  Musculoskeletal:        General: No deformity. Normal range of motion.  Skin:    General: Skin is warm.  Neurological:     Mental Status: He is oriented to person, place, and time. Mental status is at baseline.  Psychiatric:        Mood and Affect: Mood normal.        Behavior: Behavior normal.     (all labs ordered are listed, but only abnormal results are displayed) Labs Reviewed - No data to display  EKG: None  Radiology: No results found.   Procedures   Medications Ordered in the ED - No data to display                                  Medical Decision Making  Patient allowed to sleep, monitor through the night.  Has done well without difficulties.  Patient awake now this morning, will discharge.     Final diagnoses:  Alcoholic intoxication without complication    ED Discharge Orders  None          Haze Lonni PARAS, MD 11/20/24 [phone]

## 2024-11-20 NOTE — ED Notes (Signed)
 Pt walked to bathroom....KM
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
## 2024-11-21 ENCOUNTER — Emergency Department (HOSPITAL_COMMUNITY)
Admission: EM | Admit: 2024-11-21 | Discharge: 2024-11-21 | Disposition: A | Discharge status: Home / Self Care | Attending: Emergency Medicine | Admitting: Emergency Medicine

## 2024-11-21 DIAGNOSIS — Z760 Encounter for issue of repeat prescription: Secondary | ICD-10-CM | POA: Insufficient documentation

## 2024-11-21 MED ORDER — METHOCARBAMOL 500 MG PO TABS: 500.0000 mg | ORAL_TABLET | Freq: Two times a day (BID) | ORAL | 0 refills | Status: DC

## 2024-11-21 NOTE — ED Notes (Signed)
 Patient Alert and oriented to baseline. Stable and ambulatory to baseline. Patient verbalized understanding of the discharge instructions.  Patient belongings were taken by the patient.

## 2024-11-21 NOTE — ED Provider Notes (Signed)
 [place] EMERGENCY DEPARTMENT AT [facility] Provider Note   CSN: [id number] Arrival date & time: 11/21/24  9090     Patient presents with: Medication Refill   Nathan Norton is a 43 y.o. male.   The history is provided by the patient and medical records. No language interpreter was used.  Medication Refill    43 year old male history of malingering, schizoaffective disorder, frequent ER visits, return to the ED after being discharged last night for evaluation with desiccation.  Please note patient never left the facility.  Patient checked back in requesting for refill of his muscle relaxant.  Sts he occasionally have muscle spasm and pain and the medication helps.    Prior to Admission medications   Medication Sig Start Date End Date Taking? Authorizing Provider  acetaminophen  (TYLENOL ) 325 MG tablet Take 2 tablets (650 mg total) by mouth every 6 (six) hours as needed. 11/05/24   Elnor Jayson LABOR, DO  amoxicillin -clavulanate (AUGMENTIN ) 875-125 MG tablet Take 1 tablet by mouth every 12 (twelve) hours. 11/11/24   Bernis Ernst, PA-C  clotrimazole  (LOTRIMIN ) 1 % cream Apply to affected area 2 times daily 03/17/23   [name], PA-C  clotrimazole  (LOTRIMIN ) 1 % cream Apply to affected area 2 times daily 11/06/24   Laurice Maude BROCKS, MD  diclofenac  (VOLTAREN ) 25 MG EC tablet Take 1 tablet (25 mg total) by mouth 2 (two) times daily as needed. 11/12/24   Nivia Colon, PA-C  diphenhydrAMINE  (BENADRYL ) 25 MG tablet Take 1 tablet (25 mg total) by mouth every 6 (six) hours as needed. 11/06/24   Laurice Maude BROCKS, MD  loratadine  (CLARITIN ) 10 MG tablet Take 1 tablet (10 mg total) by mouth daily as needed for allergies or rhinitis. 06/19/22   Petrucelli, Samantha R, PA-C  methocarbamol  (ROBAXIN ) 500 MG tablet Take 1 tablet (500 mg total) by mouth 2 (two) times daily. 11/17/24   Smoot, Lauraine LABOR, PA-C  ondansetron  (ZOFRAN ) 4 MG tablet Take 1 tablet (4 mg total) by mouth every 6 (six) hours.  11/17/24   Smoot, Lauraine LABOR, PA-C  pantoprazole  (PROTONIX ) 40 MG tablet Take 1 tablet (40 mg total) by mouth daily. 11/16/24   Raford Lenis, MD  valACYclovir  (VALTREX ) 1000 MG tablet Take 1 tablet (1,000 mg total) by mouth 3 (three) times daily. 11/11/24   Bernis Ernst, PA-C    Allergies: Shellfish allergy    Review of Systems  Constitutional:  Negative for fever.    Updated Vital Signs BP (!) 151/60 (BP Location: Right Arm)   Pulse 64   Temp 98.2 F (36.8 C) (Oral)   Resp 16   SpO2 100%   Physical Exam Constitutional:      General: He is not in acute distress.    Appearance: He is well-developed.     Comments: Speech is fluent, ambulate without deficit.  HENT:     Head: Atraumatic.  Eyes:     Conjunctiva/sclera: Conjunctivae normal.  Musculoskeletal:     Cervical back: Normal range of motion and neck supple.  Skin:    Findings: No rash.  Neurological:     Mental Status: He is alert.     (all labs ordered are listed, but only abnormal results are displayed) Labs Reviewed - No data to display  EKG: None  Radiology: No results found.   Procedures   Medications Ordered in the ED - No data to display  Medical Decision Making  BP (!) 151/60 (BP Location: Right Arm)   Pulse 64   Temp 98.2 F (36.8 C) (Oral)   Resp 16   SpO2 100%   11:53 PM  43 year old male history of malingering, schizoaffective disorder, frequent ER visits, return to the ED after being discharged last night for evaluation with desiccation.  Please note patient never left the facility.  Patient checked back in requesting for refill of his muscle relaxant.  Sts he occasionally have muscle spasm and pain and the medication helps.    Pt resting comfortably, in no acute discomfort.  Will refill his robaxin .     Final diagnoses:  Medication refill    ED Discharge Orders          Ordered    methocarbamol  (ROBAXIN ) 500 MG tablet  2 times daily         11/21/24 1250               Nivia Colon, PA-C 11/21/24 1251    Melvenia Motto, MD 11/21/24 2147

## 2024-11-21 NOTE — ED Provider Notes (Signed)
 Patient signed out to me for acute alcohol intoxication.  Patient monitored through the night.  He has done well.  Patient now awake alert, has eaten a meal.  Appropriate for discharge.   Haze Lonni PARAS, MD 11/21/24 [phone]

## 2024-11-21 NOTE — ED Notes (Signed)
Pt ambulated to bathroom with steady gait with no assistance.

## 2024-11-21 NOTE — ED Triage Notes (Signed)
 Returns after never leaving. Seen back to back multiple times. D/c'd from facility after ETOH intoxication, then returned for lying on sidewalk intoxicated. Just d/c'd and checking back in for med refill. Alert, NAD, calm, interactive, resps e/u, speaking in clear complete sentences, steady gait.

## 2024-11-24 ENCOUNTER — Encounter: Payer: Self-pay | Admitting: *Deleted
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
## 2024-11-25 ENCOUNTER — Other Ambulatory Visit: Payer: Self-pay

## 2024-11-25 ENCOUNTER — Encounter (HOSPITAL_COMMUNITY): Payer: Self-pay

## 2024-11-25 ENCOUNTER — Emergency Department (HOSPITAL_COMMUNITY): Admission: EM | Admit: 2024-11-25 | Discharge: 2024-11-25 | Disposition: A | Discharge status: Home / Self Care

## 2024-11-25 DIAGNOSIS — M533 Sacrococcygeal disorders, not elsewhere classified: Secondary | ICD-10-CM | POA: Insufficient documentation

## 2024-11-25 DIAGNOSIS — M545 Low back pain, unspecified: Secondary | ICD-10-CM | POA: Diagnosis present

## 2024-11-25 MED ORDER — LIDOCAINE 5 % EX PTCH
1.0000 | MEDICATED_PATCH | CUTANEOUS | Status: DC
  Administered 2024-11-25: 1 via TRANSDERMAL
  Filled 2024-11-25: qty 1

## 2024-11-25 MED ORDER — IBUPROFEN 400 MG PO TABS
600.0000 mg | ORAL_TABLET | Freq: Once | ORAL | Status: AC
  Administered 2024-11-25: 600 mg via ORAL
  Filled 2024-11-25: qty 1

## 2024-11-25 NOTE — ED Triage Notes (Signed)
 C/O left hip pain that started this morning. Denies heavy lifting. Pt ambulatory.

## 2024-11-25 NOTE — ED Provider Notes (Signed)
 [place] EMERGENCY DEPARTMENT AT [facility] Provider Note   CSN: [id number] Arrival date & time: 11/25/24  1533     Patient presents with: Hip Pain   Nathan Norton is a 43 y.o. male.    Hip Pain Pertinent negatives include no chest pain, no abdominal pain and no shortness of breath.   Patient presents because of left lower back pain.  Chronic in nature.  Took Tylenol  earlier without Account relief.  No bowel or bladder cons.  No saddle anesthesia.  Has been able to walk today..  Patient asking for things given dinner with turkey.  He has no other complaints.  No chest pain or shortness of breath.  No fever no chills.  No numbness or ting anywhere.  No weakness.    Previous medical history reviewed : Patient last seen in the ED on November 21, 2024.  History of malingering, schizoaffective disorder, frequent ER visits.   Prior to Admission medications   Medication Sig Start Date End Date Taking? Authorizing Provider  acetaminophen  (TYLENOL ) 325 MG tablet Take 2 tablets (650 mg total) by mouth every 6 (six) hours as needed. 11/05/24   Elnor Jayson LABOR, DO  amoxicillin -clavulanate (AUGMENTIN ) 875-125 MG tablet Take 1 tablet by mouth every 12 (twelve) hours. 11/11/24   Bernis Ernst, PA-C  clotrimazole  (LOTRIMIN ) 1 % cream Apply to affected area 2 times daily 03/17/23   [name], PA-C  clotrimazole  (LOTRIMIN ) 1 % cream Apply to affected area 2 times daily 11/06/24   Laurice Maude BROCKS, MD  diclofenac  (VOLTAREN ) 25 MG EC tablet Take 1 tablet (25 mg total) by mouth 2 (two) times daily as needed. 11/12/24   Nivia Colon, PA-C  diphenhydrAMINE  (BENADRYL ) 25 MG tablet Take 1 tablet (25 mg total) by mouth every 6 (six) hours as needed. 11/06/24   Laurice Maude BROCKS, MD  loratadine  (CLARITIN ) 10 MG tablet Take 1 tablet (10 mg total) by mouth daily as needed for allergies or rhinitis. 06/19/22   Petrucelli, Samantha R, PA-C  methocarbamol  (ROBAXIN ) 500 MG tablet Take 1 tablet (500 mg  total) by mouth 2 (two) times daily. 11/21/24   Nivia Colon, PA-C  ondansetron  (ZOFRAN ) 4 MG tablet Take 1 tablet (4 mg total) by mouth every 6 (six) hours. 11/17/24   Smoot, Lauraine LABOR, PA-C  pantoprazole  (PROTONIX ) 40 MG tablet Take 1 tablet (40 mg total) by mouth daily. 11/16/24   Raford Lenis, MD  valACYclovir  (VALTREX ) 1000 MG tablet Take 1 tablet (1,000 mg total) by mouth 3 (three) times daily. 11/11/24   Bernis Ernst, PA-C    Allergies: Shellfish allergy    Review of Systems  Constitutional:  Negative for chills and fever.  HENT:  Negative for ear pain and sore throat.   Eyes:  Negative for pain and visual disturbance.  Respiratory:  Negative for cough and shortness of breath.   Cardiovascular:  Negative for chest pain and palpitations.  Gastrointestinal:  Negative for abdominal pain and vomiting.  Genitourinary:  Negative for dysuria and hematuria.  Musculoskeletal:  Negative for arthralgias and back pain.  Skin:  Negative for color change and rash.  Neurological:  Negative for seizures and syncope.  All other systems reviewed and are negative.   Updated Vital Signs BP 132/84 (BP Location: Right Arm)   Pulse 93   Temp 98.3 F (36.8 C)   Resp 16   Ht 6' 3 (1.905 m)   Wt 95.3 kg   SpO2 98%   BMI 26.25 kg/m  Physical Exam Vitals and nursing note reviewed.  Constitutional:      General: He is not in acute distress.    Appearance: He is well-developed.  HENT:     Head: Normocephalic and atraumatic.  Eyes:     Conjunctiva/sclera: Conjunctivae normal.  Cardiovascular:     Rate and Rhythm: Normal rate and regular rhythm.     Heart sounds: No murmur heard. Pulmonary:     Effort: Pulmonary effort is normal. No respiratory distress.     Breath sounds: Normal breath sounds.  Abdominal:     Palpations: Abdomen is soft.     Tenderness: There is no abdominal tenderness.  Musculoskeletal:        General: No swelling.       Arms:     Cervical back: Neck supple.   Skin:    General: Skin is warm and dry.     Capillary Refill: Capillary refill takes less than 2 seconds.  Neurological:     Mental Status: He is alert.  Psychiatric:        Mood and Affect: Mood normal.     (all labs ordered are listed, but only abnormal results are displayed) Labs Reviewed - No data to display  EKG: None  Radiology: No results found.   Procedures   Medications Ordered in the ED  ibuprofen  (ADVIL ) tablet 600 mg (has no administration in time range)  lidocaine  (LIDODERM ) 5 % 1 patch (has no administration in time range)                                    Medical Decision Making Risk Prescription drug management.     HPI:   Patient presents because of left lower back pain.  Chronic in nature.  Took Tylenol  earlier without Account relief.  No bowel or bladder cons.  No saddle anesthesia.  Has been able to walk today..  Patient asking for things given dinner with turkey.  He has no other complaints.  No chest pain or shortness of breath.  No fever no chills.  No numbness or ting anywhere.  No weakness.    Previous medical history reviewed : Patient last seen in the ED on November 21, 2024.  History of malingering, schizoaffective disorder, frequent ER visits.  MDM:   Patient's pain in the left sacroiliac area.  Reproducible.  No CVA pain.  No concerns for nephrolithiasis or pyelonephritis.  Able to ambulate.  No fever no chills.  No concerns for acute spinal epidural abscess.  No trauma no falls.  No bowel bladder continence.  No groin numbness.  No further imaging needed.  Recommended symptomatic Introl.  Patient asking for a Thanksgiving dinner.  I do think there is a likely a component malingering.  Being discharged     Upon reexamination, patient hemodynamically stable.  Remains A&O x 3 with GCS 15.    Social Determinant of Health: malingering    Disposition and Follow Up: pcp       Final diagnoses:  Sacroiliac pain    ED  Discharge Orders     None          Simon Lavonia SAILOR, MD 11/25/24 1818

## 2024-11-25 NOTE — Discharge Instructions (Signed)
 For pain, you can take 1000 mg of Tylenol  or 1 g of Tylenol  every 6-8 hours.  Do not exceed more than 4000 mg or 4 g in a 24-hour period.  You can also take ibuprofen  600 to 800 mg every 6-8 hours as well.  Do not take this high-dose ibuprofen  for greater than a week.  Come back if you have any kind of fever, bowel or bladder incontinence.  Numbness of your groin.

## 2024-11-27 ENCOUNTER — Other Ambulatory Visit: Payer: Self-pay

## 2024-11-27 ENCOUNTER — Emergency Department (HOSPITAL_COMMUNITY)
Admission: EM | Admit: 2024-11-27 | Discharge: 2024-11-27 | Disposition: A | Discharge status: Home / Self Care | Attending: Emergency Medicine | Admitting: Emergency Medicine

## 2024-11-27 ENCOUNTER — Encounter (HOSPITAL_COMMUNITY): Payer: Self-pay | Admitting: *Deleted

## 2024-11-27 DIAGNOSIS — S76211A Strain of adductor muscle, fascia and tendon of right thigh, initial encounter: Secondary | ICD-10-CM | POA: Diagnosis not present

## 2024-11-27 DIAGNOSIS — X501XXA Overexertion from prolonged static or awkward postures, initial encounter: Secondary | ICD-10-CM | POA: Diagnosis not present

## 2024-11-27 DIAGNOSIS — R1030 Lower abdominal pain, unspecified: Secondary | ICD-10-CM | POA: Diagnosis present

## 2024-11-27 MED ORDER — KETOROLAC TROMETHAMINE 30 MG/ML IJ SOLN
30.0000 mg | Freq: Once | INTRAMUSCULAR | Status: AC
  Administered 2024-11-27: 30 mg via INTRAMUSCULAR
  Filled 2024-11-27: qty 1

## 2024-11-27 NOTE — ED Provider Notes (Signed)
 [place] EMERGENCY DEPARTMENT AT [facility] Provider Note   CSN: [id number] Arrival date & time: 11/27/24  1750     Patient presents with: Groin Pain   Nathan Norton is a 43 y.o. male presenting to ED with complaint of groin pain.  Patient says he feels that he strained his groin when he was squatting or benching about 2 days ago.  He also said he is here because it is cold outside and he wanted to get in.   HPI     Prior to Admission medications   Medication Sig Start Date End Date Taking? Authorizing Provider  acetaminophen  (TYLENOL ) 325 MG tablet Take 2 tablets (650 mg total) by mouth every 6 (six) hours as needed. 11/05/24   Elnor Jayson LABOR, DO  amoxicillin -clavulanate (AUGMENTIN ) 875-125 MG tablet Take 1 tablet by mouth every 12 (twelve) hours. 11/11/24   Bernis Ernst, PA-C  clotrimazole  (LOTRIMIN ) 1 % cream Apply to affected area 2 times daily 03/17/23   [name], PA-C  clotrimazole  (LOTRIMIN ) 1 % cream Apply to affected area 2 times daily 11/06/24   Laurice Maude BROCKS, MD  diclofenac  (VOLTAREN ) 25 MG EC tablet Take 1 tablet (25 mg total) by mouth 2 (two) times daily as needed. 11/12/24   Nivia Colon, PA-C  diphenhydrAMINE  (BENADRYL ) 25 MG tablet Take 1 tablet (25 mg total) by mouth every 6 (six) hours as needed. 11/06/24   Laurice Maude BROCKS, MD  loratadine  (CLARITIN ) 10 MG tablet Take 1 tablet (10 mg total) by mouth daily as needed for allergies or rhinitis. 06/19/22   Petrucelli, Samantha R, PA-C  methocarbamol  (ROBAXIN ) 500 MG tablet Take 1 tablet (500 mg total) by mouth 2 (two) times daily. 11/21/24   Nivia Colon, PA-C  ondansetron  (ZOFRAN ) 4 MG tablet Take 1 tablet (4 mg total) by mouth every 6 (six) hours. 11/17/24   Smoot, Lauraine LABOR, PA-C  pantoprazole  (PROTONIX ) 40 MG tablet Take 1 tablet (40 mg total) by mouth daily. 11/16/24   Raford Lenis, MD  valACYclovir  (VALTREX ) 1000 MG tablet Take 1 tablet (1,000 mg total) by mouth 3 (three) times daily. 11/11/24    Bernis Ernst, PA-C    Allergies: Shellfish allergy    Review of Systems  Updated Vital Signs BP (!) 140/91 (BP Location: Right Arm)   Pulse (!) 105   Temp 98 F (36.7 C)   Resp 16   Ht 6' 3 (1.905 m)   Wt 95.3 kg   SpO2 94%   BMI 26.26 kg/m   Physical Exam Constitutional:      General: He is not in acute distress. HENT:     Head: Normocephalic and atraumatic.  Eyes:     Conjunctiva/sclera: Conjunctivae normal.     Pupils: Pupils are equal, round, and reactive to light.  Cardiovascular:     Rate and Rhythm: Normal rate and regular rhythm.  Pulmonary:     Effort: Pulmonary effort is normal. No respiratory distress.  Abdominal:     General: There is no distension.     Tenderness: There is no abdominal tenderness.  Skin:    General: Skin is warm and dry.  Neurological:     General: No focal deficit present.     Mental Status: He is alert. Mental status is at baseline.  Psychiatric:        Mood and Affect: Mood normal.        Behavior: Behavior normal.     (all labs ordered are listed, but only  abnormal results are displayed) Labs Reviewed - No data to display  EKG: None  Radiology: No results found.   Procedures   Medications Ordered in the ED  ketorolac  (TORADOL ) 30 MG/ML injection 30 mg (has no administration in time range)                                    Medical Decision Making Risk Prescription drug management.   Patient is not here with an emergent condition.  He may have strained his groin.  He is here mostly because he requested to come out of the cold.  I did offer him a shot of Toradol  for the pain, and he will be stable for discharge.  He has had 26 ER visits in the past 6 months, is known for frequent ED visits for a variety of nonemergent complaints, and has had robust workups including earlier this week.  No indication for repeat imaging at this time.     Final diagnoses:  Inguinal strain, right, initial encounter    ED  Discharge Orders     None          Dharma Pare, Donnice PARAS, MD 11/27/24 1931

## 2024-11-27 NOTE — ED Triage Notes (Signed)
 Pt c/o R groin pain.  Sts he was doing squats at the gym and stretched it.

## 2024-11-30 ENCOUNTER — Other Ambulatory Visit: Payer: Self-pay

## 2024-11-30 ENCOUNTER — Emergency Department (HOSPITAL_COMMUNITY)
Admission: EM | Admit: 2024-11-30 | Discharge: 2024-11-30 | Disposition: A | Discharge status: Home / Self Care | Attending: Emergency Medicine | Admitting: Emergency Medicine

## 2024-11-30 DIAGNOSIS — R21 Rash and other nonspecific skin eruption: Secondary | ICD-10-CM | POA: Diagnosis present

## 2024-11-30 MED ORDER — FAMOTIDINE 20 MG PO TABS
20.0000 mg | ORAL_TABLET | Freq: Once | ORAL | Status: AC
  Administered 2024-11-30: 20 mg via ORAL
  Filled 2024-11-30: qty 1

## 2024-11-30 MED ORDER — DIPHENHYDRAMINE HCL 25 MG PO CAPS
25.0000 mg | ORAL_CAPSULE | Freq: Once | ORAL | Status: AC
  Administered 2024-11-30: 25 mg via ORAL
  Filled 2024-11-30: qty 1

## 2024-11-30 NOTE — ED Notes (Addendum)
 Pt notes that he only noticed one brown bug on him this morning, he did not catch the bug. Complains of itching and hives. No hives seen on eval.

## 2024-11-30 NOTE — ED Notes (Addendum)
Bus pass given to pt

## 2024-11-30 NOTE — Discharge Instructions (Signed)
 Thank you for visiting the Emergency Department today.  It was a pleasure to be part of your healthcare team.  You were seen today for a rash.  As discussed, the rash is not concerning of any sort of insect bite.  The rash appears to be a contact dermatitis with some moderately dry skin.  You were given Benadryl  and Pepcid for symptomatic relief.  I recommend antihistamines and Aquaphor/Vaseline as needed.  If any new or worsening symptoms arise, return to the Emergency Department or call 911. Thank you for trusting us  with your health.

## 2024-11-30 NOTE — ED Provider Notes (Signed)
 [place] EMERGENCY DEPARTMENT AT [facility] Provider Note   CSN: [id number] Arrival date & time: 11/30/24  9185     Patient presents with: bed bugs   Nathan Norton is a 43 y.o. male presents to the emergency department for a rash.  Patient is unsure if it is related to possible bedbugs or changing his soap.  Patient denies fevers.  Patient is in no acute distress.   HPI     Prior to Admission medications   Medication Sig Start Date End Date Taking? Authorizing Provider  acetaminophen  (TYLENOL ) 325 MG tablet Take 2 tablets (650 mg total) by mouth every 6 (six) hours as needed. 11/05/24   Elnor Jayson LABOR, DO  amoxicillin -clavulanate (AUGMENTIN ) 875-125 MG tablet Take 1 tablet by mouth every 12 (twelve) hours. 11/11/24   Bernis Ernst, PA-C  clotrimazole  (LOTRIMIN ) 1 % cream Apply to affected area 2 times daily 03/17/23   [name], PA-C  clotrimazole  (LOTRIMIN ) 1 % cream Apply to affected area 2 times daily 11/06/24   Laurice Maude BROCKS, MD  diclofenac  (VOLTAREN ) 25 MG EC tablet Take 1 tablet (25 mg total) by mouth 2 (two) times daily as needed. 11/12/24   Nivia Colon, PA-C  diphenhydrAMINE  (BENADRYL ) 25 MG tablet Take 1 tablet (25 mg total) by mouth every 6 (six) hours as needed. 11/06/24   Laurice Maude BROCKS, MD  loratadine  (CLARITIN ) 10 MG tablet Take 1 tablet (10 mg total) by mouth daily as needed for allergies or rhinitis. 06/19/22   Petrucelli, Samantha R, PA-C  methocarbamol  (ROBAXIN ) 500 MG tablet Take 1 tablet (500 mg total) by mouth 2 (two) times daily. 11/21/24   Nivia Colon, PA-C  ondansetron  (ZOFRAN ) 4 MG tablet Take 1 tablet (4 mg total) by mouth every 6 (six) hours. 11/17/24   Smoot, Lauraine LABOR, PA-C  pantoprazole  (PROTONIX ) 40 MG tablet Take 1 tablet (40 mg total) by mouth daily. 11/16/24   Raford Lenis, MD  valACYclovir  (VALTREX ) 1000 MG tablet Take 1 tablet (1,000 mg total) by mouth 3 (three) times daily. 11/11/24   Bernis Ernst, PA-C    Allergies: Shellfish  allergy    Review of Systems  Skin:  Positive for rash.    Updated Vital Signs BP 126/83 (BP Location: Left Arm)   Pulse 83   Temp 98.2 F (36.8 C) (Oral)   Resp 18   SpO2 100%   Physical Exam Vitals and nursing note reviewed.  Constitutional:      General: He is not in acute distress.    Appearance: Normal appearance.  HENT:     Head: Normocephalic and atraumatic.  Eyes:     Extraocular Movements: Extraocular movements intact.     Conjunctiva/sclera: Conjunctivae normal.     Pupils: Pupils are equal, round, and reactive to light.  Cardiovascular:     Rate and Rhythm: Normal rate and regular rhythm.     Pulses: Normal pulses.  Pulmonary:     Effort: Pulmonary effort is normal. No respiratory distress.  Abdominal:     General: Abdomen is flat.     Palpations: Abdomen is soft.     Tenderness: There is no abdominal tenderness.  Musculoskeletal:        General: Normal range of motion.     Cervical back: Normal range of motion.  Skin:    General: Skin is warm and dry.     Capillary Refill: Capillary refill takes less than 2 seconds.     Findings: Rash present.  Comments: Patient had a generalized rash to his upper arms/back. Rash was lightly erythematous, nonpruritic, not warm to the touch.  Unable to distinguish if it is a dermatitis or xeroderma.  There is no insect bites noted.  Neurological:     General: No focal deficit present.     Mental Status: He is alert. Mental status is at baseline.  Psychiatric:        Mood and Affect: Mood normal.     (all labs ordered are listed, but only abnormal results are displayed) Labs Reviewed - No data to display  EKG: None  Radiology: No results found.   Procedures   Medications Ordered in the ED  diphenhydrAMINE  (BENADRYL ) capsule 25 mg (25 mg Oral Given 11/30/24 1048)  famotidine  (PEPCID ) tablet 20 mg (20 mg Oral Given 11/30/24 1048)                                  Medical Decision Making  Patient presents  to the ED for: rash/insect bite This involves an extensive number of treatment options Differential diagnosis includes:  Dermatitis Insect bite Cellulitis Other infectious/dermatological etiology  Co-morbid conditions: Schizophrenia  Clinical Course as of 11/30/24 1515  Tue Nov 30, 2024  0922 Temp: 97.6 F (36.4 C) Vital stable, patient in no acute distress. [ML]  1100 Patient tolerated Benadryl  and Pepcid  [ML]    Clinical Course User Index [ML] Willma Bouchard L, PA    Management / Treatments: See ED course above for medications and treatments administered. Reevaluation of the patient after these medicines showed that the patient improved.   I have reviewed the patients home medicines and have made adjustments as needed  ED Course / Reassessments: Problem List: rash/insect bite 43 year old male presented for possible rash insect bite. Initial assessment included history, physical exam, and review of prior medical records. Patients physical exam revealed a rash that appeared to be a mild contact dermatitis, or dry skin.  Patient states that he just changed the bar of soap that he has been using and thinks that this could be a contributing factor.  Patient was unsure if it could have been bedbugs, however rash does not appear to be caused by insect bites. Management included Benadryl  and Pepcid  PO, with ongoing reassessment.  Vital signs were obtained and monitored, and the patient remained stable throughout the stay. The patient showed improvement with medication regimen -patient stated that he would change his soap back to his previous, and use supportive care for rash/dry skin.  patient response: improved with medication regimen Serial reassessments performed: Yes    Social determinants impacting care: mental health/psychiatric barriers   Disposition: Disposition: Discharge with close follow-up with PCP for further evaluation and care. Rationale for disposition: Stable for  discharge The disposition plan and rationale were discussed with the patient at the bedside, all questions were addressed, and the patient demonstrated understanding.  This note was produced using [employer]. While I have reviewed and verified all clinical information, transcription errors may remain.     Final diagnoses:  Rash    ED Discharge Orders     None          Willma Bouchard [place], [place] 11/30/24 1515    Franklyn Sid SAILOR, MD 11/30/24 [phone]

## 2024-12-01 ENCOUNTER — Emergency Department (HOSPITAL_COMMUNITY)
Admission: EM | Admit: 2024-12-01 | Discharge: 2024-12-01 | Disposition: A | Discharge status: Home / Self Care | Attending: Emergency Medicine | Admitting: Emergency Medicine

## 2024-12-01 DIAGNOSIS — Z5900 Homelessness unspecified: Secondary | ICD-10-CM | POA: Insufficient documentation

## 2024-12-01 DIAGNOSIS — B353 Tinea pedis: Secondary | ICD-10-CM | POA: Insufficient documentation

## 2024-12-01 DIAGNOSIS — Z760 Encounter for issue of repeat prescription: Secondary | ICD-10-CM | POA: Insufficient documentation

## 2024-12-01 DIAGNOSIS — E119 Type 2 diabetes mellitus without complications: Secondary | ICD-10-CM | POA: Insufficient documentation

## 2024-12-01 DIAGNOSIS — I10 Essential (primary) hypertension: Secondary | ICD-10-CM | POA: Diagnosis not present

## 2024-12-01 MED ORDER — CLOTRIMAZOLE 1 % EX CREA: TOPICAL_CREAM | CUTANEOUS | 0 refills | Status: DC
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
## 2024-12-01 NOTE — Discharge Instructions (Addendum)
 It appears that you have a fungal infection of the skin on your feet.  Please keep your feet dry and apply the clotrimazole  cream as prescribed twice daily until your symptoms resolve.  You may use this cream for up to 4 weeks.  Please schedule a follow-up appointment with your primary care provider for recheck of your symptoms and further routine care.  Please return to the emergency room for any other new or concerning symptoms

## 2024-12-01 NOTE — ED Provider Notes (Signed)
 [place] EMERGENCY DEPARTMENT AT [facility] Provider Note   CSN: [id number] Arrival date & time: 12/01/24  1310     Patient presents with: Medication Refill   Nathan Norton is a 43 y.o. male with history of schizophrenia, homelessness, hypertension, type 2 diabetes, presents requesting a refill of his methocarbamol .  States that sometimes when he works out at [employer], he feels a spasm and this is what he takes the medication for.  He also reports some white skin on his feet which is somewhat itchy.  Denies any wounds to his feet.  No fever or chills.    Medication Refill      Prior to Admission medications   Medication Sig Start Date End Date Taking? Authorizing Provider  acetaminophen  (TYLENOL ) 325 MG tablet Take 2 tablets (650 mg total) by mouth every 6 (six) hours as needed. 11/05/24   Elnor Jayson LABOR, DO  amoxicillin -clavulanate (AUGMENTIN ) 875-125 MG tablet Take 1 tablet by mouth every 12 (twelve) hours. 11/11/24   Bernis Ernst, PA-C  clotrimazole  (LOTRIMIN ) 1 % cream Apply to affected area 2 times daily 12/01/24   Veta Palma, PA-C  diclofenac  (VOLTAREN ) 25 MG EC tablet Take 1 tablet (25 mg total) by mouth 2 (two) times daily as needed. 11/12/24   Nivia Colon, PA-C  diphenhydrAMINE  (BENADRYL ) 25 MG tablet Take 1 tablet (25 mg total) by mouth every 6 (six) hours as needed. 11/06/24   Laurice Maude BROCKS, MD  loratadine  (CLARITIN ) 10 MG tablet Take 1 tablet (10 mg total) by mouth daily as needed for allergies or rhinitis. 06/19/22   Petrucelli, Samantha R, PA-C  methocarbamol  (ROBAXIN ) 500 MG tablet Take 1 tablet (500 mg total) by mouth 2 (two) times daily. 11/21/24   Nivia Colon, PA-C  ondansetron  (ZOFRAN ) 4 MG tablet Take 1 tablet (4 mg total) by mouth every 6 (six) hours. 11/17/24   Smoot, Lauraine LABOR, PA-C  pantoprazole  (PROTONIX ) 40 MG tablet Take 1 tablet (40 mg total) by mouth daily. 11/16/24   Raford Lenis, MD  rosuvastatin (CRESTOR) 5 MG tablet Take 5 mg by  mouth daily. 10/09/24   [provider]  valACYclovir  (VALTREX ) 1000 MG tablet Take 1 tablet (1,000 mg total) by mouth 3 (three) times daily. 11/11/24   Bernis Ernst, PA-C    Allergies: Shellfish allergy    Review of Systems  Constitutional:  Negative for fever.    Updated Vital Signs BP (!) 145/96   Pulse (!) 104   Temp 98.1 F (36.7 C) (Oral)   Resp 20   SpO2 99%   Physical Exam Vitals and nursing note reviewed.  Constitutional:      Appearance: Normal appearance.  HENT:     Head: Atraumatic.  Cardiovascular:     Rate and Rhythm: Normal rate and regular rhythm.     Comments: 2+ dorsalis pedis pulse bilaterally Pulmonary:     Effort: Pulmonary effort is normal.  Skin:    Comments: Dried flaky appearing skin on the bottom of the feet bilaterally.  No erythema, edema, wounds  Neurological:     General: No focal deficit present.     Mental Status: He is alert.  Psychiatric:        Mood and Affect: Mood normal.        Behavior: Behavior normal.         (all labs ordered are listed, but only abnormal results are displayed) Labs Reviewed - No data to display  EKG: None  Radiology: No results  found.   Procedures   Medications Ordered in the ED - No data to display                                  Medical Decision Making    Differential diagnosis includes but is not limited to medication refill, cellulitis, tinea, dried skin  ED Course:  Upon initial evaluation, patient is well-appearing, no acute distress.  Normal vital signs.  Requesting a refill of his methocarbamol .  He states that he takes this because sometimes he feels a spasm in his gluteal region when he does squats in the gym.  I discussed with patient that I do not feel this is indicated at this time for muscle pain due to working out.  He has been prescribed multiple refills from the emergency room, and I feel this would be best managed by primary provider can monitor his symptoms  and labs.  We discussed that he needs to follow-up with his primary provider regarding this concern.  He states that he does have a primary provider, I encouraged him to call to schedule an appointment Patient was reporting concern for some white skin to his feet bilaterally.  Does report some itching associated with this.  On exam, he does have dry flaky skin on his feet.  Given the itchy nature, question tinea pedis.  Will treat with course of clotrimazole . Patient is stable and appropriate for discharge home   Impression: Tinea pedis  Disposition:  The patient was discharged home with instructions to use the clotrimazole  cream as prescribed.  Make a follow-up appoint with his PCP within the next month for further medication management and routine care. Return precautions given and patient verbalized understanding.    Record Review: External records from outside source obtained and reviewed including multiple ER visits, 30 in the last 6 months     This chart was dictated using voice recognition software, Dragon. Despite the best efforts of this provider to proofread and correct errors, errors may still occur which can change documentation meaning.       Final diagnoses:  Tinea pedis of both feet    ED Discharge Orders          Ordered    clotrimazole  (LOTRIMIN ) 1 % cream        12/01/24 1711               Veta Palma, PA-C 12/01/24 1715    Tegeler, Lonni PARAS, MD 12/01/24 1950

## 2024-12-01 NOTE — ED Triage Notes (Signed)
 Pt. Stated, Im here for medication refill, A type of cream , Robaxin . No symptoms

## 2024-12-01 NOTE — ED Notes (Signed)
 The pt is homeless  he comes to the ed every day and or night

## 2024-12-02 ENCOUNTER — Encounter: Payer: Self-pay | Admitting: Critical Care Medicine

## 2024-12-02 ENCOUNTER — Ambulatory Visit: Attending: Critical Care Medicine | Admitting: Critical Care Medicine

## 2024-12-02 VITALS — BP 121/71 | HR 106 | Resp 19 | Ht 75.0 in | Wt 209.0 lb

## 2024-12-02 DIAGNOSIS — B353 Tinea pedis: Secondary | ICD-10-CM

## 2024-12-02 DIAGNOSIS — F250 Schizoaffective disorder, bipolar type: Secondary | ICD-10-CM

## 2024-12-02 DIAGNOSIS — K011 Impacted teeth: Secondary | ICD-10-CM

## 2024-12-02 DIAGNOSIS — E1142 Type 2 diabetes mellitus with diabetic polyneuropathy: Secondary | ICD-10-CM

## 2024-12-02 DIAGNOSIS — B009 Herpesviral infection, unspecified: Secondary | ICD-10-CM

## 2024-12-02 DIAGNOSIS — L84 Corns and callosities: Secondary | ICD-10-CM

## 2024-12-02 DIAGNOSIS — R7303 Prediabetes: Secondary | ICD-10-CM

## 2024-12-02 DIAGNOSIS — R7989 Other specified abnormal findings of blood chemistry: Secondary | ICD-10-CM

## 2024-12-02 DIAGNOSIS — A630 Anogenital (venereal) warts: Secondary | ICD-10-CM

## 2024-12-02 DIAGNOSIS — Q046 Congenital cerebral cysts: Secondary | ICD-10-CM

## 2024-12-02 DIAGNOSIS — Z8719 Personal history of other diseases of the digestive system: Secondary | ICD-10-CM

## 2024-12-02 DIAGNOSIS — Z5901 Sheltered homelessness: Secondary | ICD-10-CM

## 2024-12-02 DIAGNOSIS — F258 Other schizoaffective disorders: Secondary | ICD-10-CM

## 2024-12-02 DIAGNOSIS — E119 Type 2 diabetes mellitus without complications: Secondary | ICD-10-CM

## 2024-12-02 DIAGNOSIS — S069X9S Unspecified intracranial injury with loss of consciousness of unspecified duration, sequela: Secondary | ICD-10-CM

## 2024-12-02 DIAGNOSIS — I10 Essential (primary) hypertension: Secondary | ICD-10-CM

## 2024-12-02 DIAGNOSIS — G8929 Other chronic pain: Secondary | ICD-10-CM

## 2024-12-02 DIAGNOSIS — F1910 Other psychoactive substance abuse, uncomplicated: Secondary | ICD-10-CM

## 2024-12-02 DIAGNOSIS — H539 Unspecified visual disturbance: Secondary | ICD-10-CM

## 2024-12-02 DIAGNOSIS — F1010 Alcohol abuse, uncomplicated: Secondary | ICD-10-CM

## 2024-12-02 MED ORDER — BENZTROPINE MESYLATE 1 MG PO TABS: 1.0000 mg | ORAL_TABLET | Freq: Every day | ORAL | 2 refills | Status: AC

## 2024-12-02 MED ORDER — TIZANIDINE HCL 2 MG PO TABS: 2.0000 mg | ORAL_TABLET | Freq: Three times a day (TID) | ORAL | 1 refills | Status: AC | PRN

## 2024-12-02 MED ORDER — CLOTRIMAZOLE 1 % EX CREA: TOPICAL_CREAM | CUTANEOUS | 1 refills | Status: AC

## 2024-12-02 MED ORDER — IMIQUIMOD 5 % EX CREA: TOPICAL_CREAM | CUTANEOUS | 0 refills | Status: DC

## 2024-12-02 MED ORDER — TRIAMCINOLONE ACETONIDE 0.1 % EX CREA: 1.0000 | TOPICAL_CREAM | Freq: Two times a day (BID) | CUTANEOUS | 0 refills | Status: AC

## 2024-12-02 MED ORDER — ARIPIPRAZOLE 10 MG PO TABS: 10.0000 mg | ORAL_TABLET | Freq: Every day | ORAL | 2 refills | Status: DC

## 2024-12-02 NOTE — Patient Instructions (Signed)
 Creams sent for your back feet and genital warts on your penis  Refills on muscle relaxant sent tizanidine  Simethicone  sent for gas  Start Abilify  and Cogentin  for your mental health  Referral to foot doctor and oral surgeon was made

## 2024-12-03 ENCOUNTER — Encounter: Payer: Self-pay | Admitting: Critical Care Medicine

## 2024-12-03 DIAGNOSIS — R7989 Other specified abnormal findings of blood chemistry: Secondary | ICD-10-CM | POA: Insufficient documentation

## 2024-12-03 DIAGNOSIS — H539 Unspecified visual disturbance: Secondary | ICD-10-CM | POA: Insufficient documentation

## 2024-12-03 DIAGNOSIS — Z8719 Personal history of other diseases of the digestive system: Secondary | ICD-10-CM | POA: Insufficient documentation

## 2024-12-03 DIAGNOSIS — A630 Anogenital (venereal) warts: Secondary | ICD-10-CM | POA: Insufficient documentation

## 2024-12-03 DIAGNOSIS — G8929 Other chronic pain: Secondary | ICD-10-CM | POA: Insufficient documentation

## 2024-12-03 DIAGNOSIS — E1142 Type 2 diabetes mellitus with diabetic polyneuropathy: Secondary | ICD-10-CM | POA: Insufficient documentation

## 2024-12-03 DIAGNOSIS — B353 Tinea pedis: Secondary | ICD-10-CM | POA: Insufficient documentation

## 2024-12-03 DIAGNOSIS — F1910 Other psychoactive substance abuse, uncomplicated: Secondary | ICD-10-CM | POA: Insufficient documentation

## 2024-12-03 DIAGNOSIS — K011 Impacted teeth: Secondary | ICD-10-CM | POA: Insufficient documentation

## 2024-12-03 DIAGNOSIS — L84 Corns and callosities: Secondary | ICD-10-CM | POA: Insufficient documentation

## 2024-12-03 HISTORY — DX: Other specified abnormal findings of blood chemistry: R79.89

## 2024-12-03 NOTE — Assessment & Plan Note (Signed)
 As per other schizophrenia assessments

## 2024-12-03 NOTE — Assessment & Plan Note (Signed)
 Patient currently in the homeless shelter [facility] where we have a medical clinic  Given his mental conditions organic and psychiatric will be very difficult to get him placed in independent housing

## 2024-12-03 NOTE — Assessment & Plan Note (Signed)
 Blood pressure is normal will check labs however there does not appear to be any evidence of need for medication

## 2024-12-03 NOTE — Assessment & Plan Note (Signed)
 No evidence of this appears resolved

## 2024-12-03 NOTE — Assessment & Plan Note (Signed)
 Will follow-up liver function studies and he was recently screened for hepatitis A, B, and C all of which were negative likely due to alcohol use

## 2024-12-03 NOTE — Assessment & Plan Note (Signed)
 Recent urinary drug screen showed fentanyl and benzodiazepines

## 2024-12-03 NOTE — Assessment & Plan Note (Signed)
 Patient endorses only a few beers a day expect there is binge drinking going on here as well

## 2024-12-03 NOTE — Assessment & Plan Note (Signed)
 History of traumatic brain injury may account for some of the patient's behavior

## 2024-12-03 NOTE — Assessment & Plan Note (Signed)
 Severe impaction of teeth particular molars on the left upper and lower jaw also multiple carious teeth he likely needs to complete and total extraction of all teeth and dentures thus will refer to oral maxillofacial surgery

## 2024-12-03 NOTE — Assessment & Plan Note (Signed)
 Patient lifting heavy weights advised adjustment in his workout program and will give tizanidine  as needed for spasm
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
## 2024-12-03 NOTE — Assessment & Plan Note (Signed)
 Involving the penis only will administer topical Aldara 

## 2024-12-03 NOTE — Assessment & Plan Note (Signed)
 Will reorder the clotrimazole  lotion he needs moisturization to the feet we [facility] referral is indicated

## 2024-12-03 NOTE — Assessment & Plan Note (Signed)
 Just finished a course of Augmentin  will monitor no infection seen now needs extractions of teeth

## 2024-12-03 NOTE — Assessment & Plan Note (Signed)
 Recheck labs doubtful patient has established type 2 diabetes

## 2024-12-03 NOTE — Assessment & Plan Note (Signed)
 Visual disturbance with a wide eyed look will get him into optometry

## 2024-12-03 NOTE — Assessment & Plan Note (Signed)
 No real evidence he has type 2 diabetes with polyneuropathy A1c a year ago was normal blood sugars have been normal we will go ahead and recheck A1c today and likely resolve this problem if he has anything it might be prediabetes but I doubt even that is a diagnosis here

## 2024-12-04 LAB — COMPREHENSIVE METABOLIC PANEL WITH GFR
ALT: 27 IU/L (ref 0–44)
AST: 19 IU/L (ref 0–40)
Albumin: 4.4 g/dL (ref 4.1–5.1)
Alkaline Phosphatase: 85 IU/L (ref 47–123)
BUN/Creatinine Ratio: 8 — ABNORMAL LOW (ref 9–20)
BUN: 10 mg/dL (ref 6–24)
Bilirubin Total: 0.3 mg/dL (ref 0.0–1.2)
CO2: 22 mmol/L (ref 20–29)
Calcium: 9.5 mg/dL (ref 8.7–10.2)
Chloride: 101 mmol/L (ref 96–106)
Creatinine, Ser: 1.25 mg/dL (ref 0.76–1.27)
Globulin, Total: 3.3 g/dL (ref 1.5–4.5)
Glucose: 85 mg/dL (ref 70–99)
Potassium: 4.1 mmol/L (ref 3.5–5.2)
Sodium: 139 mmol/L (ref 134–144)
Total Protein: 7.7 g/dL (ref 6.0–8.5)
eGFR: 73 mL/min/1.73 (ref >59)

## 2024-12-04 LAB — LIPID PANEL
Chol/HDL Ratio: 3.1 ratio (ref 0.0–5.0)
Cholesterol, Total: 177 mg/dL (ref 100–199)
HDL: 58 mg/dL (ref >39)
LDL Chol Calc (NIH): 97 mg/dL (ref 0–99)
Triglycerides: 125 mg/dL (ref 0–149)
VLDL Cholesterol Cal: 22 mg/dL (ref 5–40)

## 2024-12-04 LAB — MICROALBUMIN / CREATININE URINE RATIO
Creatinine, Urine: 22.8 mg/dL
Microalb/Creat Ratio: <13 mg/g{creat} (ref 0–29)
Microalbumin, Urine: <3 ug/mL

## 2024-12-04 LAB — THYROID PANEL WITH TSH
Free Thyroxine Index: 2.3 (ref 1.2–4.9)
T3 Uptake Ratio: 30 % (ref 24–39)
T4, Total: 7.7 ug/dL (ref 4.5–12.0)
TSH: 0.872 u[IU]/mL (ref 0.450–4.500)

## 2024-12-06 ENCOUNTER — Encounter: Payer: Self-pay | Admitting: Critical Care Medicine

## 2024-12-06 ENCOUNTER — Ambulatory Visit: Payer: Self-pay | Admitting: Critical Care Medicine

## 2024-12-06 DIAGNOSIS — R7989 Other specified abnormal findings of blood chemistry: Secondary | ICD-10-CM

## 2024-12-06 NOTE — Assessment & Plan Note (Signed)
 resolved

## 2024-12-06 NOTE — Progress Notes (Signed)
 Let pt know there is no diabetes,  urine is normal  kidney normal  thyroid  and liver normal

## 2024-12-10 ENCOUNTER — Emergency Department (HOSPITAL_COMMUNITY)
Admission: EM | Admit: 2024-12-10 | Discharge: 2024-12-10 | Disposition: A | Discharge status: Home / Self Care | Attending: Emergency Medicine | Admitting: Emergency Medicine

## 2024-12-10 ENCOUNTER — Encounter (HOSPITAL_COMMUNITY): Payer: Self-pay

## 2024-12-10 ENCOUNTER — Other Ambulatory Visit: Payer: Self-pay

## 2024-12-10 DIAGNOSIS — J069 Acute upper respiratory infection, unspecified: Secondary | ICD-10-CM | POA: Insufficient documentation

## 2024-12-10 DIAGNOSIS — Z91040 Latex allergy status: Secondary | ICD-10-CM | POA: Insufficient documentation

## 2024-12-10 MED ORDER — BENZONATATE 100 MG PO CAPS: 200.0000 mg | ORAL_CAPSULE | Freq: Two times a day (BID) | ORAL | 0 refills | Status: AC | PRN

## 2024-12-10 MED ORDER — LIDOCAINE VISCOUS HCL 2 % MT SOLN: 15.0000 mL | OROMUCOSAL | 0 refills | Status: AC | PRN

## 2024-12-10 NOTE — ED Triage Notes (Signed)
Pt c/o sore throat

## 2024-12-10 NOTE — ED Provider Notes (Signed)
 [place] EMERGENCY DEPARTMENT AT [facility] Provider Note   CSN: [id number] Arrival date & time: 12/10/24  0845     Patient presents with: Sore Throat   Nathan Norton is a 43 y.o. male.    Sore Throat  This patient is a 43 year old male presenting with classic upper respiratory symptoms including sore throat head congestion and a cough, been going on for about a week, multiple people at the [facility] where he lives have had similar symptoms.  He denies fevers vomiting or diarrhea     Prior to Admission medications  Medication Sig Start Date End Date Taking? Authorizing Provider  acetaminophen  (TYLENOL ) 325 MG tablet Take 2 tablets (650 mg total) by mouth every 6 (six) hours as needed. 11/05/24   Elnor Jayson LABOR, DO  ARIPiprazole  (ABILIFY ) 10 MG tablet Take 1 tablet (10 mg total) by mouth daily. 12/02/24   Brien Belvie BRAVO, MD  benzonatate (TESSALON) 100 MG capsule Take 2 capsules (200 mg total) by mouth 2 (two) times daily as needed for cough. 12/10/24  Yes Cleotilde Rogue, MD  benztropine  (COGENTIN ) 1 MG tablet Take 1 tablet (1 mg total) by mouth at bedtime. 12/02/24   Brien Belvie BRAVO, MD  clotrimazole  (LOTRIMIN ) 1 % cream Apply to affected area 2 times daily to feet 12/02/24   Brien Belvie BRAVO, MD  diphenhydrAMINE  (BENADRYL ) 25 MG tablet Take 1 tablet (25 mg total) by mouth every 6 (six) hours as needed. 11/06/24   Laurice Maude BROCKS, MD  imiquimod  (ALDARA ) 5 % cream Apply topically 3 (three) times a week. To penis warts 12/03/24   Brien Belvie BRAVO, MD  lidocaine  (XYLOCAINE ) 2 % solution Use as directed 15 mLs in the mouth or throat every 4 (four) hours as needed for mouth pain. 12/10/24  Yes Cleotilde Rogue, MD  tiZANidine  (ZANAFLEX ) 2 MG tablet Take 1 tablet (2 mg total) by mouth every 8 (eight) hours as needed. 12/02/24   Brien Belvie BRAVO, MD  triamcinolone  cream (KENALOG ) 0.1 % Apply 1 Application topically 2 (two) times daily. To back rash 12/02/24   Brien Belvie BRAVO,  MD    Allergies: Shellfish allergy and Latex    Review of Systems  All other systems reviewed and are negative.   Updated Vital Signs BP 126/82 (BP Location: Right Arm)   Pulse 100   Temp 98.1 F (36.7 C)   Resp 16   SpO2 98%   Physical Exam Vitals and nursing note reviewed.  Constitutional:      General: He is not in acute distress.    Appearance: He is well-developed.  HENT:     Head: Normocephalic and atraumatic.     Mouth/Throat:     Pharynx: No oropharyngeal exudate.  Eyes:     General: No scleral icterus.       Right eye: No discharge.        Left eye: No discharge.     Conjunctiva/sclera: Conjunctivae normal.     Pupils: Pupils are equal, round, and reactive to light.  Neck:     Thyroid : No thyromegaly.     Vascular: No JVD.  Cardiovascular:     Rate and Rhythm: Normal rate and regular rhythm.     Heart sounds: Normal heart sounds. No murmur heard.    No friction rub. No gallop.  Pulmonary:     Effort: Pulmonary effort is normal. No respiratory distress.     Breath sounds: Normal breath sounds. No wheezing or rales.  Abdominal:  General: Bowel sounds are normal. There is no distension.     Palpations: Abdomen is soft. There is no mass.     Tenderness: There is no abdominal tenderness.  Musculoskeletal:        General: No tenderness. Normal range of motion.     Cervical back: Normal range of motion and neck supple.  Lymphadenopathy:     Cervical: No cervical adenopathy.  Skin:    General: Skin is warm and dry.     Findings: No erythema or rash.  Neurological:     Mental Status: He is alert.     Coordination: Coordination normal.  Psychiatric:        Behavior: Behavior normal.     (all labs ordered are listed, but only abnormal results are displayed) Labs Reviewed - No data to display  EKG: None  Radiology: No results found.   Procedures   Medications Ordered in the ED - No data to display                                  Medical  Decision Making Risk Prescription drug management.   Lungs clear, speaks in full sentences, vital signs unremarkable, no fever, borderline tachycardia, no hypoxia normal blood pressure no lymphadenopathy no purulent exudate, no trismus or torticollis, clear oropharynx, normal phonation, well-appearing, likely viral upper respiratory infection, supportive care     Final diagnoses:  Viral URI with cough    ED Discharge Orders          Ordered    lidocaine  (XYLOCAINE ) 2 % solution  Every 4 hours PRN        12/10/24 0858    benzonatate (TESSALON) 100 MG capsule  2 times daily PRN        12/10/24 0858               Cleotilde Rogue, MD 12/10/24 [phone]

## 2024-12-10 NOTE — Discharge Instructions (Addendum)
 Read the attached instructions,  Tessalon is a cough medication that helps reduce the amount of coughing that you are having.  You may take up to 200 mg every 8 hours as needed.  This can be used safely with most or other over-the-counter medications but talk to your pharmacist before taking anything else over-the-counter with it  Lidocaine  is a viscous jelly like a medication that can be gargled and swallowed to help with sore throat

## 2024-12-14 ENCOUNTER — Encounter (HOSPITAL_COMMUNITY): Payer: Self-pay | Admitting: Emergency Medicine

## 2024-12-14 ENCOUNTER — Emergency Department (HOSPITAL_COMMUNITY)
Admission: EM | Admit: 2024-12-14 | Discharge: 2024-12-15 | Attending: Emergency Medicine | Admitting: Emergency Medicine

## 2024-12-14 ENCOUNTER — Other Ambulatory Visit: Payer: Self-pay

## 2024-12-14 DIAGNOSIS — Z5321 Procedure and treatment not carried out due to patient leaving prior to being seen by health care provider: Secondary | ICD-10-CM | POA: Diagnosis not present

## 2024-12-14 DIAGNOSIS — R202 Paresthesia of skin: Secondary | ICD-10-CM | POA: Diagnosis present

## 2024-12-14 DIAGNOSIS — F1010 Alcohol abuse, uncomplicated: Secondary | ICD-10-CM | POA: Diagnosis not present
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
## 2024-12-14 NOTE — ED Notes (Addendum)
 Patient got up and walked with a steady gait to the lobby. Patient unwilling to wait for MSE eval.

## 2024-12-15 NOTE — ED Notes (Signed)
 Pt stated he was leaving and will be back tomorrow because he has somewhere to be.

## 2024-12-20 ENCOUNTER — Encounter: Payer: Self-pay | Admitting: Physician Assistant

## 2024-12-20 ENCOUNTER — Other Ambulatory Visit: Payer: Self-pay

## 2024-12-20 NOTE — Progress Notes (Unsigned)
 Pt recently went to the ER for cough.  He was requested to use the clinic here for minor issues, OTC meds.   Seen by Dr Brien 12/04, Rx sent in but never picked up. Pt has a bus pass, says will pick up meds at [facility] on [address] today. Pharmacy contacted and asked to fill rx.     Labs reviewed w/ pt. Microalbumin/Creatinine ratio was normal, LFTs are improved, Cr improved, all other labs remained nl.  Lab Results  Component Value Date   TSH 0.872 12/02/2024   Lab Results  Component Value Date   CHOL 177 12/02/2024   HDL 58 12/02/2024   LDLCALC 97 12/02/2024   TRIG 125 12/02/2024   CHOLHDL 3.1 12/02/2024      Latest Ref Rng & Units 12/02/2024    2:46 PM 11/16/2024    6:55 AM 11/02/2024   12:23 PM  CMP  Glucose 70 - 99 mg/dL 85  97  70   BUN 6 - 24 mg/dL 10  9  11    Creatinine 0.76 - 1.27 mg/dL 8.74  8.68  8.78   Sodium 134 - 144 mmol/L 139  137  138   Potassium 3.5 - 5.2 mmol/L 4.1  4.1  3.8   Chloride 96 - 106 mmol/L 101  104  102   CO2 20 - 29 mmol/L 22  19  23    Calcium 8.7 - 10.2 mg/dL 9.5  8.6  8.8   Total Protein 6.0 - 8.5 g/dL 7.7  6.5    Total Bilirubin 0.0 - 1.2 mg/dL 0.3  0.7    Alkaline Phos 47 - 123 IU/L 85  65    AST 0 - 40 IU/L 19  101    ALT 0 - 44 IU/L 27  79

## 2024-12-24 ENCOUNTER — Other Ambulatory Visit: Payer: Self-pay | Admitting: Critical Care Medicine

## 2024-12-26 NOTE — Telephone Encounter (Signed)
 Dr. Vicci,   Unsure who to send this to. Patient saw Dr. Brien earlier this month but does not have an upcoming appt to assign a new PCP. I am unable to send to Dr. Newlin as she is out on PAL.

## 2024-12-27 ENCOUNTER — Emergency Department (HOSPITAL_COMMUNITY)
Admission: EM | Admit: 2024-12-27 | Discharge: 2024-12-27 | Disposition: A | Discharge status: Home / Self Care | Attending: Emergency Medicine | Admitting: Emergency Medicine

## 2024-12-27 ENCOUNTER — Other Ambulatory Visit: Payer: Self-pay

## 2024-12-27 ENCOUNTER — Encounter (HOSPITAL_COMMUNITY): Payer: Self-pay | Admitting: *Deleted

## 2024-12-27 ENCOUNTER — Other Ambulatory Visit (HOSPITAL_COMMUNITY): Payer: Self-pay

## 2024-12-27 DIAGNOSIS — A630 Anogenital (venereal) warts: Secondary | ICD-10-CM | POA: Insufficient documentation

## 2024-12-27 MED ORDER — IMIQUIMOD 5 % EX CREA
TOPICAL_CREAM | CUTANEOUS | 0 refills | Status: AC
  Filled 2024-12-27: qty 12, 28d supply, fill #0

## 2024-12-27 NOTE — ED Provider Notes (Signed)
 " [facility] EMER[facility] DE[facility] AT [facility] Provider Note   CSN: [id number] Arrival date & time: 12/27/24  9146     Patient presents with: No chief complaint on file.   Nathan Norton is a 43 y.o. male.  Patient has genital warts, he has known history and requesting follow up. He is needing cream refilled, he was previously using Imiquimod  prescribed by PCP. No new complaints, not wanting STD testing.    HPI     Prior to Admission medications  Medication Sig Start Date End Date Taking? Authorizing Provider  acetaminophen  (TYLENOL ) 325 MG tablet Take 2 tablets (650 mg total) by mouth every 6 (six) hours as needed. 11/05/24   Elnor Jayson LABOR, DO  ARIPiprazole  (ABILIFY ) 10 MG tablet Take 1 tablet (10 mg total) by mouth daily. 12/02/24   Brien Belvie BRAVO, MD  benzonatate  (TESSALON ) 100 MG capsule Take 2 capsules (200 mg total) by mouth 2 (two) times daily as needed for cough. 12/10/24   Cleotilde Rogue, MD  benztropine  (COGENTIN ) 1 MG tablet Take 1 tablet (1 mg total) by mouth at bedtime. 12/02/24   Brien Belvie BRAVO, MD  clotrimazole  (LOTRIMIN ) 1 % cream Apply to affected area 2 times daily to feet 12/02/24   Brien Belvie BRAVO, MD  diphenhydrAMINE  (BENADRYL ) 25 MG tablet Take 1 tablet (25 mg total) by mouth every 6 (six) hours as needed. 11/06/24   Laurice Maude BROCKS, MD  imiquimod  (ALDARA ) 5 % cream Apply topically 3 (three) times a week. To penis warts 12/03/24   Brien Belvie BRAVO, MD  lidocaine  (XYLOCAINE ) 2 % solution Use as directed 15 mLs in the mouth or throat every 4 (four) hours as needed for mouth pain. 12/10/24   Cleotilde Rogue, MD  tiZANidine  (ZANAFLEX ) 2 MG tablet Take 1 tablet (2 mg total) by mouth every 8 (eight) hours as needed. 12/02/24   Brien Belvie BRAVO, MD  triamcinolone  cream (KENALOG ) 0.1 % Apply 1 Application topically 2 (two) times daily. To back rash 12/02/24   Brien Belvie BRAVO, MD    Allergies: Shellfish allergy and Latex    Review of Systems  Skin:   Positive for rash.    Updated Vital Signs BP (!) 136/96 (BP Location: Right Arm)   Pulse 91   Temp 98.4 F (36.9 C) (Oral)   Resp 18   Ht 6' 3 (1.905 m)   Wt 94.8 kg   SpO2 97%   BMI 26.12 kg/m   Physical Exam Vitals and nursing note reviewed.  Constitutional:      General: He is not in acute distress.    Appearance: He is not toxic-appearing.  HENT:     Head: Normocephalic and atraumatic.  Eyes:     General: No scleral icterus.    Conjunctiva/sclera: Conjunctivae normal.  Cardiovascular:     Rate and Rhythm: Normal rate and regular rhythm.     Pulses: Normal pulses.     Heart sounds: Normal heart sounds.  Pulmonary:     Effort: Pulmonary effort is normal. No respiratory distress.     Breath sounds: Normal breath sounds.  Abdominal:     General: Abdomen is flat. Bowel sounds are normal.     Palpations: Abdomen is soft.     Tenderness: There is no abdominal tenderness.  Genitourinary:    Comments: Chaperone Tech present in room. Genital lesion on penis. No ulceration/erythema.  Skin:    General: Skin is warm and dry.     Findings: No lesion.  Neurological:     General: No focal deficit present.     Mental Status: He is alert and oriented to person, place, and time. Mental status is at baseline.     (all labs ordered are listed, but only abnormal results are displayed) Labs Reviewed - No data to display  EKG: None  Radiology: No results found.   Procedures   Medications Ordered in the ED - No data to display                                  Medical Decision Making Risk Prescription drug management.   This patient presents to the ED for concern of genital warts, this involves an extensive number of treatment options, and is a complaint that carries with it a high risk of complications and morbidity.  The differential diagnosis includes HSV, other STD, Genital warts, cellulitis    Problem List / ED Course / Critical interventions / Medication  management  Patient presenting to ER with complaint of genital warts, he is requesting refill of his medication because he no longer has it.  He reports he has previously seen his PCP for the symptoms and nothing has changed.  He does not want any further testing here.  I will give dermatology follow-up and recent medicine.  I feel is appropriate for discharge with outpatient follow-up. Vitals stable.          Final diagnoses:  Genital warts    ED Discharge Orders     None          Shermon Warren LOISE DEVONNA 12/27/24 1310    Towana Ozell BROCKS, MD 12/27/24 1734  "

## 2024-12-27 NOTE — ED Triage Notes (Signed)
 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

## 2024-12-27 NOTE — Discharge Instructions (Signed)
 It is important that you use safe sex practices, I have refilled the medication, you need to follow-up with your primary care or dermatologist.  If this does not work you may need to consider surgical removal which we cannot do here.  Please follow-up outpatient for this.

## 2024-12-31 ENCOUNTER — Other Ambulatory Visit: Payer: Self-pay

## 2024-12-31 ENCOUNTER — Other Ambulatory Visit (HOSPITAL_COMMUNITY): Payer: Self-pay

## 2024-12-31 ENCOUNTER — Encounter (HOSPITAL_COMMUNITY): Payer: Self-pay

## 2024-12-31 ENCOUNTER — Emergency Department (HOSPITAL_COMMUNITY)
Admission: EM | Admit: 2024-12-31 | Discharge: 2024-12-31 | Disposition: A | Discharge status: Home / Self Care | Attending: Emergency Medicine | Admitting: Emergency Medicine

## 2024-12-31 DIAGNOSIS — R0981 Nasal congestion: Secondary | ICD-10-CM | POA: Diagnosis present

## 2024-12-31 DIAGNOSIS — Z91040 Latex allergy status: Secondary | ICD-10-CM | POA: Diagnosis not present

## 2024-12-31 DIAGNOSIS — J069 Acute upper respiratory infection, unspecified: Secondary | ICD-10-CM | POA: Insufficient documentation

## 2024-12-31 DIAGNOSIS — J302 Other seasonal allergic rhinitis: Secondary | ICD-10-CM | POA: Insufficient documentation

## 2024-12-31 MED ORDER — CETIRIZINE HCL 10 MG PO TABS
10.0000 mg | ORAL_TABLET | Freq: Every day | ORAL | 0 refills | Status: AC
  Filled 2024-12-31: qty 30, 30d supply, fill #0

## 2024-12-31 MED ORDER — CETIRIZINE HCL 10 MG PO TABS: 10.0000 mg | ORAL_TABLET | Freq: Every day | ORAL | 0 refills | Status: DC

## 2024-12-31 MED ORDER — FLUTICASONE PROPIONATE 50 MCG/ACT NA SUSP
2.0000 | Freq: Every day | NASAL | 1 refills | Status: AC
  Filled 2024-12-31 (×2): qty 16, 30d supply, fill #0

## 2024-12-31 MED ORDER — FLUTICASONE PROPIONATE 50 MCG/ACT NA SUSP: 2.0000 | Freq: Every day | NASAL | 1 refills | Status: DC

## 2024-12-31 NOTE — ED Triage Notes (Signed)
 Patient reports runny nose then at night stopped up.  Denies headache n/v sob cough, fever.

## 2024-12-31 NOTE — ED Provider Notes (Signed)
 " [place] EMERGENCY DEPARTMENT AT [facility] Provider Note   CSN: [id number] Arrival date & time: 12/31/24  9073     Patient presents with: Nasal Congestion   Nathan Norton is a 44 y.o. male who presents to the ED today with primary concern of nasal congestion.  States this has been going on for 2 to 3 days, though with last night he was having difficulty sleeping due to the congestion hence his presentation to the ED today.  He denies have any headaches, dyspnea, cough, has not had any fevers subjectively.  No noted history of any respiratory conditions.   HPI     Prior to Admission medications  Medication Sig Start Date End Date Taking? Authorizing Provider  acetaminophen  (TYLENOL ) 325 MG tablet Take 2 tablets (650 mg total) by mouth every 6 (six) hours as needed. 11/05/24   Elnor Jayson LABOR, DO  ARIPiprazole  (ABILIFY ) 10 MG tablet TAKE 1 TABLET BY MOUTH EVERY DAY 12/28/24   Vicci Barnie NOVAK, MD  benzonatate  (TESSALON ) 100 MG capsule Take 2 capsules (200 mg total) by mouth 2 (two) times daily as needed for cough. 12/10/24   Cleotilde Rogue, MD  benztropine  (COGENTIN ) 1 MG tablet Take 1 tablet (1 mg total) by mouth at bedtime. 12/02/24   Brien Belvie BRAVO, MD  cetirizine  (ZYRTEC  ALLERGY) 10 MG tablet Take 1 tablet (10 mg total) by mouth daily. 12/31/24   Myriam Dorn BROCKS, PA  clotrimazole  (LOTRIMIN ) 1 % cream Apply to affected area 2 times daily to feet 12/02/24   Brien Belvie BRAVO, MD  diphenhydrAMINE  (BENADRYL ) 25 MG tablet Take 1 tablet (25 mg total) by mouth every 6 (six) hours as needed. 11/06/24   Laurice Maude BROCKS, MD  fluticasone (FLONASE) 50 MCG/ACT nasal spray Place 2 sprays into both nostrils daily. 12/31/24   Myriam Dorn BROCKS, PA  imiquimod  (ALDARA ) 5 % cream Apply topically 3 (three) times a week to penis warts 12/27/24   Barrett, Warren SAILOR, PA-C  lidocaine  (XYLOCAINE ) 2 % solution Use as directed 15 mLs in the mouth or throat every 4 (four) hours as needed for mouth  pain. 12/10/24   Cleotilde Rogue, MD  tiZANidine  (ZANAFLEX ) 2 MG tablet Take 1 tablet (2 mg total) by mouth every 8 (eight) hours as needed. 12/02/24   Brien Belvie BRAVO, MD  triamcinolone  cream (KENALOG ) 0.1 % Apply 1 Application topically 2 (two) times daily. To back rash 12/02/24   Brien Belvie BRAVO, MD    Allergies: Shellfish allergy and Latex    Review of Systems  HENT:  Positive for congestion.   All other systems reviewed and are negative.   Updated Vital Signs BP (!) 141/74 (BP Location: Right Arm)   Pulse 95   Temp 99 F (37.2 C)   Resp 18   Ht 6' 3 (1.905 m)   Wt 94.8 kg   SpO2 95%   BMI 26.12 kg/m   Physical Exam Vitals and nursing note reviewed.  Constitutional:      General: He is not in acute distress.    Appearance: Normal appearance.  HENT:     Head: Normocephalic and atraumatic.     Right Ear: Ear canal and external ear normal. There is impacted cerumen.     Left Ear: Ear canal and external ear normal.     Ears:     Comments: Unable to visualize right TM secondary to cerumen    Nose: Congestion present.     Right Turbinates: Enlarged.  Left Turbinates: Enlarged.     Mouth/Throat:     Mouth: Mucous membranes are moist.     Pharynx: Oropharynx is clear.  Eyes:     Extraocular Movements: Extraocular movements intact.     Conjunctiva/sclera: Conjunctivae normal.     Pupils: Pupils are equal, round, and reactive to light.  Cardiovascular:     Rate and Rhythm: Normal rate and regular rhythm.     Pulses: Normal pulses.     Heart sounds: Normal heart sounds. No murmur heard.    No friction rub. No gallop.  Pulmonary:     Effort: Pulmonary effort is normal.     Breath sounds: Normal breath sounds.  Abdominal:     General: Abdomen is flat. Bowel sounds are normal.     Palpations: Abdomen is soft.  Musculoskeletal:        General: Normal range of motion.     Cervical back: Normal range of motion and neck supple.     Right lower leg: No edema.     Left  lower leg: No edema.  Skin:    General: Skin is warm and dry.     Capillary Refill: Capillary refill takes less than 2 seconds.  Neurological:     General: No focal deficit present.     Mental Status: He is alert. Mental status is at baseline.  Psychiatric:        Mood and Affect: Mood normal.     (all labs ordered are listed, but only abnormal results are displayed) Labs Reviewed - No data to display  EKG: None  Radiology: No results found.   Procedures   Medications Ordered in the ED - No data to display                                  Medical Decision Making Risk OTC drugs.   Based on the physical exam as well as the history of present illness, he is not having any dyspnea nor does he have any cough, but does complain of nasal congestion over the last several days.  Said no subjective fevers.  Consider possible influenza however without fever or myalgias, do not suspect this at this time.  Does have upper respiratory symptoms with history of previous allergic rhinitis.  The nasopharynx does have some erythema and edema of the turbinates and mucosa, however the nares are patent and there is no signs of postnasal drip at this time.  As such, findings consistent with likely allergic rhinitis but also likely viral URI.  Will manage with symptomatic management, increase fluid intake and use of Tylenol  or ibuprofen  for any pain, will also prescribe a course of fluticasone nasal spray and cetirizine  to manage allergic rhinitis symptoms.  Careful return precautions given which he verbalizes understanding and agreement and has no other concerns currently.  As such we will discharge with outpatient follow-up.     Final diagnoses:  Viral upper respiratory tract infection  Nasal congestion  Seasonal allergic rhinitis, unspecified trigger    ED Discharge Orders          Ordered    fluticasone (FLONASE) 50 MCG/ACT nasal spray  Daily,   Status:  Discontinued        12/31/24 1027     cetirizine  (ZYRTEC  ALLERGY) 10 MG tablet  Daily,   Status:  Discontinued        12/31/24 1027    cetirizine  (ZYRTEC   ALLERGY) 10 MG tablet  Daily        12/31/24 1027    fluticasone (FLONASE) 50 MCG/ACT nasal spray  Daily        12/31/24 1027               Myriam Dorn BROCKS, [place] 12/31/24 1032    Kingsley, Victoria K, DO 12/31/24 1045  "

## 2025-01-10 ENCOUNTER — Encounter: Payer: Self-pay | Admitting: *Deleted
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
## 2025-01-17 ENCOUNTER — Encounter (HOSPITAL_COMMUNITY): Payer: Self-pay

## 2025-01-17 ENCOUNTER — Other Ambulatory Visit: Payer: Self-pay

## 2025-01-17 ENCOUNTER — Emergency Department (HOSPITAL_COMMUNITY)
Admission: EM | Admit: 2025-01-17 | Discharge: 2025-01-17 | Disposition: A | Discharge status: Home / Self Care | Attending: Emergency Medicine | Admitting: Emergency Medicine

## 2025-01-17 DIAGNOSIS — Z91040 Latex allergy status: Secondary | ICD-10-CM | POA: Diagnosis not present

## 2025-01-17 DIAGNOSIS — M79675 Pain in left toe(s): Secondary | ICD-10-CM | POA: Insufficient documentation

## 2025-01-17 MED ORDER — IBUPROFEN 400 MG PO TABS
600.0000 mg | ORAL_TABLET | Freq: Once | ORAL | Status: AC
  Administered 2025-01-17: 600 mg via ORAL
  Filled 2025-01-17: qty 1

## 2025-01-17 NOTE — Discharge Instructions (Signed)
 You were seen today for toe pain.  There is no appear to be any infection, injury, or problems with blood flow.  Please use postop shoe and ibuprofen  as needed for pain.  Additionally you can use warm and cold therapy, and elevate.  Please return if you are having new or worsening symptoms and follow-up with your doctor this week.

## 2025-01-17 NOTE — ED Triage Notes (Signed)
 Pt came in via POV d/t the Great toe on his Lt foot hurting since yesterday. A/Ox4, rates his pain 10/10 during triage.

## 2025-01-17 NOTE — ED Provider Notes (Signed)
 " [place] [facility] Provider Note   CSN: [id number] Arrival date & time: 01/17/25  9081     Patient presents with: No chief complaint on file.   Nathan Norton is a 44 y.o. male.   HPI   44 year old male presents today complaining of left great toe pain.  He has had no prior trauma.  No history of ischemia.  No history of diabetes.  Patient is otherwise well-appearing and has no other significant complaints.  Prior to Admission medications  Medication Sig Start Date End Date Taking? Authorizing Provider  acetaminophen  (TYLENOL ) 325 MG tablet Take 2 tablets (650 mg total) by mouth every 6 (six) hours as needed. 11/05/24   Elnor Jayson LABOR, DO  ARIPiprazole  (ABILIFY ) 10 MG tablet TAKE 1 TABLET BY MOUTH EVERY DAY 12/28/24   Vicci Barnie NOVAK, MD  benzonatate  (TESSALON ) 100 MG capsule Take 2 capsules (200 mg total) by mouth 2 (two) times daily as needed for cough. 12/10/24   Cleotilde Rogue, MD  benztropine  (COGENTIN ) 1 MG tablet Take 1 tablet (1 mg total) by mouth at bedtime. 12/02/24   Brien Belvie BRAVO, MD  cetirizine  (ZYRTEC  ALLERGY) 10 MG tablet Take 1 tablet (10 mg total) by mouth daily. 12/31/24   Myriam Dorn BROCKS, PA  clotrimazole  (LOTRIMIN ) 1 % cream Apply to affected area 2 times daily to feet 12/02/24   Brien Belvie BRAVO, MD  diphenhydrAMINE  (BENADRYL ) 25 MG tablet Take 1 tablet (25 mg total) by mouth every 6 (six) hours as needed. 11/06/24   Laurice Maude BROCKS, MD  fluticasone  (FLONASE ) 50 MCG/ACT nasal spray Place 2 sprays into both nostrils daily. 12/31/24   Myriam Dorn BROCKS, PA  imiquimod  (ALDARA ) 5 % cream Apply topically 3 (three) times a week to penis warts 12/27/24   Barrett, Warren SAILOR, PA-C  lidocaine  (XYLOCAINE ) 2 % solution Use as directed 15 mLs in the mouth or throat every 4 (four) hours as needed for mouth pain. 12/10/24   Cleotilde Rogue, MD  tiZANidine  (ZANAFLEX ) 2 MG tablet Take 1 tablet (2 mg total) by mouth every 8 (eight) hours as  needed. 12/02/24   Brien Belvie BRAVO, MD  triamcinolone  cream (KENALOG ) 0.1 % Apply 1 Application topically 2 (two) times daily. To back rash 12/02/24   Brien Belvie BRAVO, MD    Allergies: Shellfish allergy and Latex    Review of Systems  Updated Vital Signs BP 138/78   Pulse 90   Temp 97.9 F (36.6 C)   Resp 18   Ht 1.905 m (6' 3)   Wt 94.3 kg   SpO2 93%   BMI 26.00 kg/m   Physical Exam Vitals and nursing note reviewed.  Constitutional:      Appearance: He is well-developed.  HENT:     Head: Normocephalic and atraumatic.     Right Ear: External ear normal.     Left Ear: External ear normal.     Nose: Nose normal.  Eyes:     Extraocular Movements: Extraocular movements intact.  Neck:     Trachea: No tracheal deviation.  Pulmonary:     Effort: Pulmonary effort is normal.  Musculoskeletal:        General: Normal range of motion.     Comments: Left great toe atraumatic in appearance.  Some mild tenderness to the tip of the toe.  Toe appears pink and well perfused.  Sensation and motor intact.  Skin:    General: Skin is warm and dry.  Neurological:     Mental Status: He is alert and oriented to person, place, and time.  Psychiatric:        Mood and Affect: Mood normal.        Behavior: Behavior normal.     (all labs ordered are listed, but only abnormal results are displayed) Labs Reviewed - No data to display  EKG: None  Radiology: No results found.   Procedures   Medications Ordered in the ED  ibuprofen  (ADVIL ) tablet 600 mg (has no administration in time range)                                    Medical Decision Making  Great toe pain without specific trauma, signs of infection, signs or symptoms of ischemia, signs or symptoms of gouty arthritis.  Plan conservative therapy at this time and postop boot.  Patient advised of recommendations, return precautions, need for follow-up.     Final diagnoses:  Pain of toe of left foot    ED Discharge  Orders     None          Levander Houston, MD 01/17/25 1045  "

## 2025-01-17 NOTE — ED Provider Triage Note (Signed)
 Emergency Medicine Provider Triage Evaluation Note  Carol Loftin , a 44 y.o. male  was evaluated in triage.  Pt complains of pain to left great toe.  Review of Systems  Positive: Pain Negative: No report of trauma  Physical Exam  BP 138/78   Pulse 90   Temp 97.9 F (36.6 C)   Resp 18   SpO2 93%  Gen:   Awake, no distress   Resp:  Normal effort  MSK:   Moves extremities without difficulty  Other:  Left toe with no obvious signs of trauma tenderness on the very tip toe is pink with good capillary refill pulses are intact  Medical Decision Making  Medically screening exam initiated at 10:10 AM.  Appropriate orders placed.  Jacquees Gongora was informed that the remainder of the evaluation will be completed by another provider, this initial triage assessment does not replace that evaluation, and the importance of remaining in the ED until their evaluation is complete.  , With pain to the tip of the great toe.  Toe appears well-perfused.  There is no obvious signs of trauma.  There is no obvious signs of infection.  PlanPlan conservative therapy with boot, elevation   Levander Houston, MD 01/17/25 1011

## 2025-01-20 ENCOUNTER — Other Ambulatory Visit: Payer: Self-pay

## 2025-01-20 ENCOUNTER — Emergency Department (HOSPITAL_COMMUNITY)
Admission: EM | Admit: 2025-01-20 | Discharge: 2025-01-20 | Discharge status: Against Medical Advice | Attending: Emergency Medicine | Admitting: Emergency Medicine

## 2025-01-20 DIAGNOSIS — Z5321 Procedure and treatment not carried out due to patient leaving prior to being seen by health care provider: Secondary | ICD-10-CM | POA: Insufficient documentation

## 2025-01-20 DIAGNOSIS — M79672 Pain in left foot: Secondary | ICD-10-CM | POA: Insufficient documentation

## 2025-01-20 DIAGNOSIS — M79675 Pain in left toe(s): Secondary | ICD-10-CM | POA: Diagnosis not present

## 2025-01-20 NOTE — ED Notes (Signed)
 Pt left ED according to [facility] staff

## 2025-01-20 NOTE — ED Triage Notes (Signed)
 Pt arrives ambulatory to lobby with complaints great toe pain on left foot for a couple days.

## 2025-01-22 ENCOUNTER — Emergency Department (HOSPITAL_COMMUNITY)

## 2025-01-22 ENCOUNTER — Other Ambulatory Visit: Payer: Self-pay

## 2025-01-22 ENCOUNTER — Encounter (HOSPITAL_COMMUNITY): Payer: Self-pay

## 2025-01-22 ENCOUNTER — Emergency Department (HOSPITAL_COMMUNITY)
Admission: EM | Admit: 2025-01-22 | Discharge: 2025-01-22 | Disposition: A | Discharge status: Home / Self Care | Attending: Student in an Organized Health Care Education/Training Program | Admitting: Student in an Organized Health Care Education/Training Program

## 2025-01-22 ENCOUNTER — Other Ambulatory Visit: Payer: Self-pay | Admitting: Internal Medicine

## 2025-01-22 DIAGNOSIS — H9202 Otalgia, left ear: Secondary | ICD-10-CM | POA: Diagnosis not present

## 2025-01-22 DIAGNOSIS — Z91040 Latex allergy status: Secondary | ICD-10-CM | POA: Diagnosis not present

## 2025-01-22 DIAGNOSIS — M79675 Pain in left toe(s): Secondary | ICD-10-CM | POA: Insufficient documentation

## 2025-01-22 DIAGNOSIS — Z79899 Other long term (current) drug therapy: Secondary | ICD-10-CM | POA: Insufficient documentation

## 2025-01-22 NOTE — Discharge Instructions (Signed)
 Continue using the orthopedic boot you were previously given. Follow-up with your primary care physician regarding your ED visits.

## 2025-01-22 NOTE — ED Triage Notes (Signed)
 Pt bib ems from [facility] c/o left ear and right great toe pain. Past two days pt has had pain.  Hx Epilepsy   BP 130/92 HR 100 RA 95% RR 14 CBG 106

## 2025-01-22 NOTE — ED Notes (Signed)
 RN provided pt bag lunch, beverage, and bus pass  RN informed pt needs to wake up, get dressed, and wait in lobby. Pt provided three pt belongings bag.

## 2025-01-22 NOTE — ED Provider Notes (Signed)
 " [place] [facility] Provider Note   CSN: [id number] Arrival date & time: 01/22/25  0104     Patient presents with: Otalgia and Toe Pain   Nathan Norton is a 44 y.o. male.    Otalgia Toe Pain   44 year old male presenting to the emergency department for left toe pain and left ear discomfort that is now resolved. When asked why the patient is in the emergency department he responds just relaxing He reports some discomfort to palpation above his left ear earlier today that has resolved Patient unsure if he stubbed his toe but reports some left toe pain Nursing reports the patient also has bedbugs that were seen upon his arrival. Patient was seen here in the emergency department 5 days ago for similar complaint.  He was given a postop boot, which he has not used since discharge at that time     Prior to Admission medications  Medication Sig Start Date End Date Taking? Authorizing Provider  acetaminophen  (TYLENOL ) 325 MG tablet Take 2 tablets (650 mg total) by mouth every 6 (six) hours as needed. 11/05/24   Elnor Jayson LABOR, DO  ARIPiprazole  (ABILIFY ) 10 MG tablet TAKE 1 TABLET BY MOUTH EVERY DAY 12/28/24   Vicci Barnie NOVAK, MD  benzonatate  (TESSALON ) 100 MG capsule Take 2 capsules (200 mg total) by mouth 2 (two) times daily as needed for cough. 12/10/24   Cleotilde Rogue, MD  benztropine  (COGENTIN ) 1 MG tablet Take 1 tablet (1 mg total) by mouth at bedtime. 12/02/24   Brien Belvie BRAVO, MD  cetirizine  (ZYRTEC  ALLERGY) 10 MG tablet Take 1 tablet (10 mg total) by mouth daily. 12/31/24   Myriam Dorn BROCKS, PA  clotrimazole  (LOTRIMIN ) 1 % cream Apply to affected area 2 times daily to feet 12/02/24   Brien Belvie BRAVO, MD  diphenhydrAMINE  (BENADRYL ) 25 MG tablet Take 1 tablet (25 mg total) by mouth every 6 (six) hours as needed. 11/06/24   Laurice Maude BROCKS, MD  fluticasone  (FLONASE ) 50 MCG/ACT nasal spray Place 2 sprays into both nostrils daily. 12/31/24    Myriam Dorn BROCKS, PA  imiquimod  (ALDARA ) 5 % cream Apply topically 3 (three) times a week to penis warts 12/27/24   Barrett, Warren SAILOR, PA-C  lidocaine  (XYLOCAINE ) 2 % solution Use as directed 15 mLs in the mouth or throat every 4 (four) hours as needed for mouth pain. 12/10/24   Cleotilde Rogue, MD  tiZANidine  (ZANAFLEX ) 2 MG tablet Take 1 tablet (2 mg total) by mouth every 8 (eight) hours as needed. 12/02/24   Brien Belvie BRAVO, MD  triamcinolone  cream (KENALOG ) 0.1 % Apply 1 Application topically 2 (two) times daily. To back rash 12/02/24   Brien Belvie BRAVO, MD    Allergies: Shellfish allergy and Latex    Review of Systems  HENT:  Positive for ear pain.   All other systems reviewed and are negative.   Updated Vital Signs BP 107/78   Pulse 77   Temp (!) 97.4 F (36.3 C) (Oral)   Resp 16   Ht 6' 3 (1.905 m)   Wt 94.3 kg   SpO2 98%   BMI 26.00 kg/m   Physical Exam Vitals and nursing note reviewed.  Constitutional:      General: He is not in acute distress. HENT:     Head: Normocephalic and atraumatic.     Right Ear: Tympanic membrane, ear canal and external ear normal.     Left Ear: Tympanic membrane, ear  canal and external ear normal.     Mouth/Throat:     Mouth: Mucous membranes are moist.  Eyes:     Conjunctiva/sclera: Conjunctivae normal.  Cardiovascular:     Rate and Rhythm: Normal rate.     Pulses: Normal pulses.  Pulmonary:     Effort: Pulmonary effort is normal.  Musculoskeletal:        General: No swelling or deformity. Normal range of motion.     Cervical back: Neck supple.     Comments: Normal toe exam Toe and foot appear well-perfused with normal pulses and sensation No injuries noted  Skin:    General: Skin is warm.     Capillary Refill: Capillary refill takes less than 2 seconds.  Neurological:     Mental Status: He is oriented to person, place, and time.     (all labs ordered are listed, but only abnormal results are displayed) Labs Reviewed -  No data to display  EKG: None  Radiology: DG Toe Great Left Result Date: 01/22/2025 EXAM: 1 VIEW(S) XRAY OF THE TOES 01/22/2025 03:01:41 AM COMPARISON: None available. CLINICAL HISTORY: Pain. FINDINGS: BONES AND JOINTS: No acute fracture. No malalignment. SOFT TISSUES: Unremarkable. IMPRESSION: 1. No acute findings. Electronically signed by: Dorethia Molt MD 01/22/2025 03:53 AM EST RP Workstation: [id number]     Procedures   Medications Ordered in the ED - No data to display                                  Medical Decision Making Amount and/or Complexity of Data Reviewed Radiology: ordered.   Patient presenting for evaluation of his left toe pain.  Physical exam was unremarkable.  His toe appears to have normal perfusion and no wounds were identified.  No redness or bruising identified.  The exam was not consistent with any developing infection, trauma, or ischemia.  No redness, swelling, or other exam findings that would be consistent with gout arthritis. Since he has come to the emergency department twice for this discomfort we proceeded to perform an x-ray of the toe. X-ray of the toe was unremarkable. Patient continued to be well-appearing on reevaluation.  Stable for discharge  Final diagnoses:  Pain of left great toe    ED Discharge Orders     None          Maudry Zeidan, DO 01/22/25 0355  "

## 2025-01-31 ENCOUNTER — Encounter (HOSPITAL_COMMUNITY): Payer: Self-pay | Admitting: Emergency Medicine

## 2025-01-31 ENCOUNTER — Other Ambulatory Visit: Payer: Self-pay

## 2025-01-31 ENCOUNTER — Emergency Department (HOSPITAL_COMMUNITY)
Admission: EM | Admit: 2025-01-31 | Discharge: 2025-02-01 | Disposition: A | Attending: Emergency Medicine | Admitting: Emergency Medicine

## 2025-01-31 DIAGNOSIS — I10 Essential (primary) hypertension: Secondary | ICD-10-CM | POA: Insufficient documentation

## 2025-01-31 DIAGNOSIS — M25561 Pain in right knee: Secondary | ICD-10-CM | POA: Insufficient documentation

## 2025-01-31 DIAGNOSIS — Z5900 Homelessness unspecified: Secondary | ICD-10-CM | POA: Insufficient documentation

## 2025-01-31 DIAGNOSIS — Z91040 Latex allergy status: Secondary | ICD-10-CM | POA: Insufficient documentation

## 2025-01-31 DIAGNOSIS — E119 Type 2 diabetes mellitus without complications: Secondary | ICD-10-CM | POA: Insufficient documentation

## 2025-01-31 NOTE — ED Provider Triage Note (Signed)
 Emergency Medicine Provider Triage Evaluation Note  Nathan Norton , a 44 y.o. male  was evaluated in triage.  Pt complains of pain in left great toe and right leg.  Remains ambulatory, denies any specific injury.  Asking to sit in the waiting room for a bit.  Review of Systems  Positive: Joint pain Negative: fever  Physical Exam  BP (!) 122/93   Pulse 90   Temp 97.9 F (36.6 C) (Oral)   Resp 17   Wt 94.3 kg   SpO2 97%   BMI 26.00 kg/m  Gen:   Awake, no distress   Resp:  Normal effort  MSK:   Moves extremities without difficulty  Other:  Dressed in multiple layers of clothing, no obvious bony deformities  Medical Decision Making  Medically screening exam initiated at 11:22 PM.  Appropriate orders placed.  Nathan Norton was informed that the remainder of the evaluation will be completed by another provider, this initial triage assessment does not replace that evaluation, and the importance of remaining in the ED until their evaluation is complete.     Nathan Olam HERO, PA-C 01/31/25 2323

## 2025-01-31 NOTE — ED Triage Notes (Signed)
 Pt in from [facility] via [place] with R posterior knee pain, L side neck pain and cough. Pt ambulatory on scene with EMS. Pt states all these symptoms began 1wk ago.  EMS VS: 142/100 94HR 18RR 98%RA

## 2025-02-01 ENCOUNTER — Emergency Department (HOSPITAL_COMMUNITY)

## 2025-02-01 MED ORDER — NAPROXEN 250 MG PO TABS
500.0000 mg | ORAL_TABLET | Freq: Once | ORAL | Status: AC
  Administered 2025-02-01: 500 mg via ORAL
  Filled 2025-02-01: qty 2

## 2025-02-01 NOTE — Discharge Instructions (Signed)
 Apply ice areas of injury 3-4 times per day to limit inflammation.  Avoid strenuous activity and heavy lifting.  We recommend consistent use of naproxen  which can be purchased over-the-counter at your local pharmacy.  Wear a knee brace for stability, if desired.  Follow-up with a primary care doctor to ensure resolution of symptoms.  Return to the ED for any new or concerning symptoms.

## 2025-02-01 NOTE — Progress Notes (Signed)
 Orthopedic Tech Progress Note Patient Details:  Nathan Norton [birth year] [id number]  Ortho Devices Type of Ortho Device: Knee Sleeve Ortho Device/Splint Location: rle Ortho Device/Splint Interventions: Ordered, Application, Adjustment   Post Interventions Patient Tolerated: Well Instructions Provided: Care of device, Adjustment of device  Chandra Dorn PARAS 02/01/2025, 6:57 AM

## 2025-02-01 NOTE — ED Notes (Signed)
 Patient verbalizes understanding of discharge instructions. Opportunity for questioning and answers were provided. Armband removed by staff, pt discharged from ED. Wheeled out to lobby
